# Patient Record
Sex: Female | Born: 1935 | Race: White | Hispanic: No | Smoking: Former smoker
Health system: Southern US, Community
[De-identification: ages and names within clinical notes are randomized; demographics above are authoritative.]

## PROBLEM LIST (undated history)

## (undated) DIAGNOSIS — T402X5A Adverse effect of other opioids, initial encounter: Secondary | ICD-10-CM

## (undated) DIAGNOSIS — D689 Coagulation defect, unspecified: Secondary | ICD-10-CM

## (undated) DIAGNOSIS — I5020 Unspecified systolic (congestive) heart failure: Secondary | ICD-10-CM

## (undated) DIAGNOSIS — M810 Age-related osteoporosis without current pathological fracture: Secondary | ICD-10-CM

## (undated) DIAGNOSIS — B0229 Other postherpetic nervous system involvement: Secondary | ICD-10-CM

## (undated) DIAGNOSIS — K5903 Drug induced constipation: Secondary | ICD-10-CM

## (undated) DIAGNOSIS — I252 Old myocardial infarction: Secondary | ICD-10-CM

## (undated) HISTORY — DX: Other postherpetic nervous system involvement: B02.29

## (undated) HISTORY — DX: Drug induced constipation: T40.2X5A

## (undated) HISTORY — DX: Adverse effect of other opioids, initial encounter: K59.03

## (undated) HISTORY — DX: Unspecified systolic (congestive) heart failure: I50.20

## (undated) HISTORY — DX: Coagulation defect, unspecified: D68.9

## (undated) HISTORY — DX: Age-related osteoporosis without current pathological fracture: M81.0

## (undated) HISTORY — DX: Old myocardial infarction: I25.2

## (undated) HISTORY — PX: HIP SURGERY: SHX245

---

## 2013-02-02 DIAGNOSIS — M949 Disorder of cartilage, unspecified: Secondary | ICD-10-CM | POA: Diagnosis not present

## 2013-02-02 DIAGNOSIS — M899 Disorder of bone, unspecified: Secondary | ICD-10-CM | POA: Diagnosis not present

## 2013-02-02 DIAGNOSIS — E559 Vitamin D deficiency, unspecified: Secondary | ICD-10-CM | POA: Diagnosis not present

## 2013-02-02 DIAGNOSIS — B0229 Other postherpetic nervous system involvement: Secondary | ICD-10-CM | POA: Diagnosis not present

## 2013-02-23 DIAGNOSIS — M949 Disorder of cartilage, unspecified: Secondary | ICD-10-CM | POA: Diagnosis not present

## 2013-02-23 DIAGNOSIS — M899 Disorder of bone, unspecified: Secondary | ICD-10-CM | POA: Diagnosis not present

## 2013-04-30 DIAGNOSIS — B0229 Other postherpetic nervous system involvement: Secondary | ICD-10-CM | POA: Diagnosis not present

## 2013-04-30 DIAGNOSIS — N183 Chronic kidney disease, stage 3 unspecified: Secondary | ICD-10-CM | POA: Diagnosis not present

## 2013-04-30 DIAGNOSIS — E559 Vitamin D deficiency, unspecified: Secondary | ICD-10-CM | POA: Diagnosis not present

## 2013-04-30 DIAGNOSIS — E785 Hyperlipidemia, unspecified: Secondary | ICD-10-CM | POA: Diagnosis not present

## 2013-04-30 DIAGNOSIS — R0602 Shortness of breath: Secondary | ICD-10-CM | POA: Diagnosis not present

## 2013-08-20 DIAGNOSIS — M899 Disorder of bone, unspecified: Secondary | ICD-10-CM | POA: Diagnosis not present

## 2013-08-20 DIAGNOSIS — M949 Disorder of cartilage, unspecified: Secondary | ICD-10-CM | POA: Diagnosis not present

## 2014-01-19 DIAGNOSIS — M8580 Other specified disorders of bone density and structure, unspecified site: Secondary | ICD-10-CM | POA: Diagnosis not present

## 2014-03-19 DIAGNOSIS — B0229 Other postherpetic nervous system involvement: Secondary | ICD-10-CM | POA: Diagnosis not present

## 2014-03-19 DIAGNOSIS — G894 Chronic pain syndrome: Secondary | ICD-10-CM | POA: Diagnosis not present

## 2014-03-19 DIAGNOSIS — R4189 Other symptoms and signs involving cognitive functions and awareness: Secondary | ICD-10-CM | POA: Diagnosis not present

## 2014-03-19 DIAGNOSIS — I482 Chronic atrial fibrillation: Secondary | ICD-10-CM | POA: Diagnosis not present

## 2014-03-20 DIAGNOSIS — I482 Chronic atrial fibrillation: Secondary | ICD-10-CM | POA: Diagnosis not present

## 2014-03-24 ENCOUNTER — Telehealth: Payer: Self-pay | Admitting: Family Medicine

## 2014-04-02 ENCOUNTER — Encounter (INDEPENDENT_AMBULATORY_CARE_PROVIDER_SITE_OTHER): Payer: Self-pay

## 2014-04-02 ENCOUNTER — Ambulatory Visit (INDEPENDENT_AMBULATORY_CARE_PROVIDER_SITE_OTHER): Payer: Medicare Other | Admitting: Family Medicine

## 2014-04-02 ENCOUNTER — Encounter: Payer: Self-pay | Admitting: Family Medicine

## 2014-04-02 VITALS — BP 146/74 | HR 101 | Temp 97.5°F | Ht 61.0 in | Wt 158.4 lb

## 2014-04-02 DIAGNOSIS — B0229 Other postherpetic nervous system involvement: Secondary | ICD-10-CM | POA: Diagnosis not present

## 2014-04-02 DIAGNOSIS — K5901 Slow transit constipation: Secondary | ICD-10-CM

## 2014-04-02 MED ORDER — METHADONE HCL 10 MG PO TABS: 10.0000 mg | ORAL_TABLET | Freq: Three times a day (TID) | ORAL | Status: DC

## 2014-04-02 NOTE — Progress Notes (Signed)
Subjective:  Patient ID: Sheena Morrow, female    DOB: [birth year]  Age: 81 y.o. MRN: [id number]  CC: Establish Care   HPI Sheena Morrow presents for follow-up of her postherpetic neuralgia. Her pain is quite severe. She brings in pain diaries showing that is under good control with the medication. Most of her pain has been in the 2-4/10 range as long as she is taking her methadone. Unfortunately she is been unable to find a provider closer to home willing to prescribe the methadone. Unfortunately the strength of the medication and her age have been of problems in developing trust with a new provider. Therefore she is following up here for now until she can find someone closer to home. Of note is that she has been on the same dose since the first visit with me several years ago. She has a documented postherpetic neuralgia of many years duration and has never given any evidence of drug inversion. She was subject to random drug testing during 2015 as part of an office initiative. She tested positive appropriately for the methadone and negative appropriately for other drugs. That will be included in her records as they are obtained from the caretaker of her record at [facility]. History Sheena Morrow has a past medical history of Postherpetic neuralgia; Osteoporosis; Constipation due to opioid therapy; and Clotting disorder.   She has no past surgical history on file.   Her family history is not on file.She reports that she quit smoking about 6 years ago. Her smoking use included Cigarettes. She started smoking about 45 years ago. She smoked 0.50 packs per day. She does not have any smokeless tobacco history on file. She reports that she does not drink alcohol or use illicit drugs.  No current outpatient prescriptions on file prior to visit.   No current facility-administered medications on file prior to visit.    ROS Review of Systems  Constitutional: Negative for fever, chills, diaphoresis, appetite  change, fatigue and unexpected weight change.  HENT: Negative for congestion, ear pain, hearing loss, postnasal drip, rhinorrhea, sneezing, sore throat and trouble swallowing.   Eyes: Negative for pain.  Respiratory: Negative for cough, chest tightness and shortness of breath.   Cardiovascular: Negative for chest pain and palpitations.  Gastrointestinal: Negative for nausea, vomiting, abdominal pain, diarrhea and constipation.  Genitourinary: Negative for dysuria, frequency and menstrual problem.  Musculoskeletal: Negative for joint swelling and arthralgias.  Skin: Negative for rash.  Neurological: Negative for dizziness, tremors, speech difficulty, weakness, numbness and headaches.  Psychiatric/Behavioral: Negative for dysphoric mood and agitation.    Objective:  BP 146/74 mmHg  Pulse 101  Temp(Src) 97.5 F (36.4 C) (Oral)  Ht 5\' 1"  (1.549 m)  Wt 158 lb 6.4 oz (71.85 kg)  BMI 29.94 kg/m2  BP Readings from Last 3 Encounters:  04/02/14 146/74    Wt Readings from Last 3 Encounters:  04/02/14 158 lb 6.4 oz (71.85 kg)     Physical Exam  Constitutional: She is oriented to person, place, and time. She appears well-developed and well-nourished. No distress.  HENT:  Head: Normocephalic and atraumatic.  Right Ear: External ear normal.  Left Ear: External ear normal.  Nose: Nose normal.  Mouth/Throat: Oropharynx is clear and moist.  Eyes: Conjunctivae and EOM are normal. Pupils are equal, round, and reactive to light.  Neck: Normal range of motion. Neck supple. No thyromegaly present.  Cardiovascular: Normal rate, regular rhythm and normal heart sounds.   No murmur heard. Pulmonary/Chest: Effort normal and breath  sounds normal. No respiratory distress. She has no wheezes. She has no rales.  Abdominal: Soft. Bowel sounds are normal. She exhibits no distension. There is no tenderness.  Lymphadenopathy:    She has no cervical adenopathy.  Neurological: She is alert and oriented to  person, place, and time. She has normal reflexes.  Skin: Skin is warm and dry.  Psychiatric: She has a normal mood and affect. Her behavior is normal. Judgment and thought content normal.          Assessment & Plan:   Sheena Morrow was seen today for establish care.  Diagnoses and all orders for this visit:  Postherpetic neuralgia  Slow transit constipation  Other orders -     Discontinue: methadone (DOLOPHINE) 10 MG tablet; Take 1 tablet (10 mg total) by mouth 3 (three) times daily. -     Discontinue: methadone (DOLOPHINE) 10 MG tablet; Take 1 tablet (10 mg total) by mouth 3 (three) times daily. -     Discontinue: methadone (DOLOPHINE) 10 MG tablet; Take 1 tablet (10 mg total) by mouth 3 (three) times daily. -     Discontinue: methadone (DOLOPHINE) 10 MG tablet; Take 1 tablet (10 mg total) by mouth 3 (three) times daily. -     Discontinue: methadone (DOLOPHINE) 10 MG tablet; Take 1 tablet (10 mg total) by mouth 3 (three) times daily. -     methadone (DOLOPHINE) 10 MG tablet; Take 1 tablet (10 mg total) by mouth 3 (three) times daily.  I am having Sheena Morrow maintain her atorvastatin, gabapentin, LINZESS, meloxicam, mometasone, montelukast, omeprazole, polyethylene glycol powder, XARELTO, PROLIA, and methadone.  Meds ordered this encounter  Medications  . atorvastatin (LIPITOR) 20 MG tablet    Sig: Take 20 mg by mouth daily.    Refill:  1  . gabapentin (NEURONTIN) 800 MG tablet    Sig: Take 1 tablet by mouth. Take 1 tablet each AM, take 1 tablet in the afternoon and take 2 tablets each evening    Refill:  4  . LINZESS 145 MCG CAPS capsule    Sig: Take 1 tablet by mouth 2 (two) times daily.    Refill:  4  . meloxicam (MOBIC) 15 MG tablet    Sig: Take 1 tablet by mouth daily.    Refill:  1  . DISCONTD: methadone (DOLOPHINE) 10 MG tablet    Sig: Take 1 tablet by mouth 3 (three) times daily.    Refill:  0  . mometasone (ELOCON) 0.1 % cream    Sig:     Refill:  5  . montelukast  (SINGULAIR) 10 MG tablet    Sig: Take 10 mg by mouth at bedtime.    Refill:  1  . omeprazole (PRILOSEC) 20 MG capsule    Sig: Take 20 mg by mouth daily.    Refill:  1  . polyethylene glycol powder (GLYCOLAX/MIRALAX) powder    Sig:     Refill:  3  . XARELTO 20 MG TABS tablet    Sig:   . PROLIA 60 MG/ML SOLN injection    Sig:   . DISCONTD: methadone (DOLOPHINE) 10 MG tablet    Sig: Take 1 tablet (10 mg total) by mouth 3 (three) times daily.    Dispense:  30 tablet    Refill:  0  . DISCONTD: methadone (DOLOPHINE) 10 MG tablet    Sig: Take 1 tablet (10 mg total) by mouth 3 (three) times daily.    Dispense:  30 tablet  Refill:  0  . DISCONTD: methadone (DOLOPHINE) 10 MG tablet    Sig: Take 1 tablet (10 mg total) by mouth 3 (three) times daily.    Dispense:  30 tablet    Refill:  0  . DISCONTD: methadone (DOLOPHINE) 10 MG tablet    Sig: Take 1 tablet (10 mg total) by mouth 3 (three) times daily.    Dispense:  90 tablet    Refill:  0  . DISCONTD: methadone (DOLOPHINE) 10 MG tablet    Sig: Take 1 tablet (10 mg total) by mouth 3 (three) times daily.    Dispense:  90 tablet    Refill:  0  . methadone (DOLOPHINE) 10 MG tablet    Sig: Take 1 tablet (10 mg total) by mouth 3 (three) times daily.    Dispense:  90 tablet    Refill:  0     Follow-up: Return in about 3 months (around 07/03/2014) for pain.  Claretta Fraise, M.D.

## 2014-04-05 DIAGNOSIS — G894 Chronic pain syndrome: Secondary | ICD-10-CM | POA: Diagnosis not present

## 2014-04-05 DIAGNOSIS — B0229 Other postherpetic nervous system involvement: Secondary | ICD-10-CM | POA: Diagnosis not present

## 2014-04-06 ENCOUNTER — Encounter: Payer: Self-pay | Admitting: Family Medicine

## 2014-04-06 DIAGNOSIS — B0229 Other postherpetic nervous system involvement: Secondary | ICD-10-CM | POA: Insufficient documentation

## 2014-05-19 ENCOUNTER — Telehealth: Payer: Self-pay | Admitting: Family Medicine

## 2014-06-15 ENCOUNTER — Other Ambulatory Visit: Payer: Self-pay | Admitting: Family Medicine

## 2014-06-30 ENCOUNTER — Telehealth: Payer: Self-pay | Admitting: Family Medicine

## 2014-06-30 ENCOUNTER — Other Ambulatory Visit: Payer: Self-pay | Admitting: Family Medicine

## 2014-07-01 NOTE — Telephone Encounter (Signed)
Patient aware to bring the prolia with her to the appointment.

## 2014-07-02 ENCOUNTER — Ambulatory Visit (INDEPENDENT_AMBULATORY_CARE_PROVIDER_SITE_OTHER): Payer: Medicare Other | Admitting: Family Medicine

## 2014-07-02 ENCOUNTER — Ambulatory Visit: Payer: Medicare Other | Admitting: Family Medicine

## 2014-07-02 ENCOUNTER — Encounter: Payer: Self-pay | Admitting: Family Medicine

## 2014-07-02 VITALS — BP 121/60 | HR 72 | Temp 97.1°F | Wt 162.4 lb

## 2014-07-02 DIAGNOSIS — B0229 Other postherpetic nervous system involvement: Secondary | ICD-10-CM

## 2014-07-02 DIAGNOSIS — K5901 Slow transit constipation: Secondary | ICD-10-CM | POA: Diagnosis not present

## 2014-07-02 MED ORDER — METHADONE HCL 10 MG PO TABS: 10.0000 mg | ORAL_TABLET | Freq: Three times a day (TID) | ORAL | Status: DC

## 2014-07-02 NOTE — Progress Notes (Signed)
Subjective:  Patient ID: Sheena Morrow, female    DOB: 02-11-1933  Age: 81 y.o. MRN: [id number]  CC: No chief complaint on file.   HPI Sheena Morrow presents for follow-up of chronic postherpetic neuralgia pain can be maximal 10/10 if she takes her dose of methadone more than a short time after the due to time. Generally minimal as long as she stays on her methadone. Pain is located in the back between shoulder blades primarily.  History Sheena Morrow has a past medical history of Postherpetic neuralgia; Osteoporosis; Constipation due to opioid therapy; and Clotting disorder.   She has no past surgical history on file.   Her family history is not on file.She reports that she quit smoking about 6 years ago. Her smoking use included Cigarettes. She started smoking about 45 years ago. She smoked 0.50 packs per day. She does not have any smokeless tobacco history on file. She reports that she does not drink alcohol or use illicit drugs.  Outpatient Prescriptions Prior to Visit  Medication Sig Dispense Refill  . atorvastatin (LIPITOR) 20 MG tablet Take 20 mg by mouth daily.  1  . gabapentin (NEURONTIN) 800 MG tablet TAKE 1 TABLET EACH MORNING,TAKE 1 TABLET EACH AFTERNOON & 2 TABLETS EACH EVENING 120 tablet 3  . LINZESS 145 MCG CAPS capsule Take 1 tablet by mouth 2 (two) times daily.  4  . meloxicam (MOBIC) 15 MG tablet TAKE 1 TABLET BY MOUTH EVERY DAY WITH FOOD FOR BACK PAIN 90 tablet 0  . mometasone (ELOCON) 0.1 % cream   5  . montelukast (SINGULAIR) 10 MG tablet Take 10 mg by mouth at bedtime.  1  . omeprazole (PRILOSEC) 20 MG capsule Take 20 mg by mouth daily.  1  . polyethylene glycol powder (GLYCOLAX/MIRALAX) powder USE 1 DOSING CUP ORALLY IN 8 OZ. OF WATER DAILY 255 g 3  . PROLIA 60 MG/ML SOLN injection     . XARELTO 20 MG TABS tablet     . methadone (DOLOPHINE) 10 MG tablet Take 1 tablet (10 mg total) by mouth 3 (three) times daily. 90 tablet 0   No facility-administered medications prior  to visit.    ROS Review of Systems  Constitutional: Negative for fever, chills, diaphoresis, appetite change, fatigue and unexpected weight change.  HENT: Negative for congestion, ear pain, hearing loss, postnasal drip, rhinorrhea, sneezing, sore throat and trouble swallowing.   Eyes: Negative for pain.  Respiratory: Negative for cough, chest tightness and shortness of breath.   Cardiovascular: Negative for chest pain and palpitations.  Gastrointestinal: Negative for nausea, vomiting, abdominal pain, diarrhea and constipation.  Genitourinary: Negative for dysuria, frequency and menstrual problem.  Musculoskeletal: Negative for joint swelling and arthralgias.  Skin: Negative for rash.  Neurological: Negative for dizziness, weakness, numbness and headaches.  Psychiatric/Behavioral: Negative for dysphoric mood and agitation.    Objective:  There were no vitals taken for this visit.  BP Readings from Last 3 Encounters:  04/02/14 146/74    Wt Readings from Last 3 Encounters:  04/02/14 158 lb 6.4 oz (71.85 kg)     Physical Exam  Constitutional: She is oriented to person, place, and time. She appears well-developed and well-nourished. No distress.  HENT:  Head: Normocephalic and atraumatic.  Right Ear: External ear normal.  Left Ear: External ear normal.  Nose: Nose normal.  Mouth/Throat: Oropharynx is clear and moist.  Eyes: Conjunctivae and EOM are normal. Pupils are equal, round, and reactive to light.  Neck: Normal range of motion.  Neck supple. No thyromegaly present.  Cardiovascular: Normal rate, regular rhythm and normal heart sounds.   No murmur heard. Pulmonary/Chest: Effort normal and breath sounds normal. No respiratory distress. She has no wheezes. She has no rales.  Abdominal: Soft. Bowel sounds are normal. She exhibits no distension. There is no tenderness.  Lymphadenopathy:    She has no cervical adenopathy.  Neurological: She is alert and oriented to person,  place, and time. She has normal reflexes.  Skin: Skin is warm and dry.  Psychiatric: She has a normal mood and affect. Her behavior is normal. Judgment and thought content normal.    No results found for: HGBA1C  No results found for: WBC, HGB, HCT, PLT, GLUCOSE, CHOL, TRIG, HDL, LDLDIRECT, LDLCALC, ALT, AST, NA, K, CL, CREATININE, BUN, CO2, TSH, PSA, INR, GLUF, HGBA1C, MICROALBUR  Patient was never admitted.  Assessment & Plan:   Diagnoses and all orders for this visit:  Postherpetic neuralgia  Slow transit constipation  Other orders -     Discontinue: methadone (DOLOPHINE) 10 MG tablet; Take 1 tablet (10 mg total) by mouth 3 (three) times daily. -     Discontinue: methadone (DOLOPHINE) 10 MG tablet; Take 1 tablet (10 mg total) by mouth 3 (three) times daily. -     methadone (DOLOPHINE) 10 MG tablet; Take 1 tablet (10 mg total) by mouth 3 (three) times daily.  I am having Sheena Morrow maintain her atorvastatin, LINZESS, mometasone, montelukast, omeprazole, XARELTO, PROLIA, gabapentin, polyethylene glycol powder, meloxicam, and methadone.  Meds ordered this encounter  Medications  . DISCONTD: methadone (DOLOPHINE) 10 MG tablet    Sig: Take 1 tablet (10 mg total) by mouth 3 (three) times daily.    Dispense:  90 tablet    Refill:  0  . DISCONTD: methadone (DOLOPHINE) 10 MG tablet    Sig: Take 1 tablet (10 mg total) by mouth 3 (three) times daily.    Dispense:  90 tablet    Refill:  0    Do Not fill until August 01, 2014  . methadone (DOLOPHINE) 10 MG tablet    Sig: Take 1 tablet (10 mg total) by mouth 3 (three) times daily.    Dispense:  90 tablet    Refill:  0    Do Not fill until August 31, 2014     Follow-up: Return in about 3 months (around 10/02/2014).  Claretta Fraise, M.D.

## 2014-07-06 ENCOUNTER — Telehealth: Payer: Self-pay

## 2014-07-06 NOTE — Telephone Encounter (Signed)
Patient said you were going to put a referral in for her for a foot dr.

## 2014-07-06 NOTE — Telephone Encounter (Signed)
Use Dr. Sharla Kidney or Donzetta Matters at [facility] in [place].

## 2014-07-07 ENCOUNTER — Other Ambulatory Visit: Payer: Self-pay

## 2014-07-07 ENCOUNTER — Telehealth: Payer: Self-pay | Admitting: Family Medicine

## 2014-07-07 DIAGNOSIS — M25579 Pain in unspecified ankle and joints of unspecified foot: Secondary | ICD-10-CM

## 2014-07-07 NOTE — Telephone Encounter (Signed)
Tell her it is already in the works

## 2014-07-07 NOTE — Telephone Encounter (Signed)
Please advise on referral

## 2014-07-08 ENCOUNTER — Telehealth: Payer: Self-pay | Admitting: Family Medicine

## 2014-07-13 ENCOUNTER — Other Ambulatory Visit: Payer: Self-pay | Admitting: *Deleted

## 2014-07-13 MED ORDER — OMEPRAZOLE 20 MG PO CPDR: 20.0000 mg | DELAYED_RELEASE_CAPSULE | Freq: Every day | ORAL | Status: DC

## 2014-07-23 ENCOUNTER — Ambulatory Visit (INDEPENDENT_AMBULATORY_CARE_PROVIDER_SITE_OTHER): Payer: Medicare Other

## 2014-07-23 DIAGNOSIS — M810 Age-related osteoporosis without current pathological fracture: Secondary | ICD-10-CM | POA: Diagnosis not present

## 2014-07-23 MED ORDER — DENOSUMAB 60 MG/ML ~~LOC~~ SOLN
60.0000 mg | Freq: Once | SUBCUTANEOUS | Status: AC
  Administered 2014-07-23: 60 mg via SUBCUTANEOUS

## 2014-07-29 DIAGNOSIS — L600 Ingrowing nail: Secondary | ICD-10-CM | POA: Diagnosis not present

## 2014-07-29 DIAGNOSIS — B351 Tinea unguium: Secondary | ICD-10-CM | POA: Diagnosis not present

## 2014-07-29 DIAGNOSIS — L602 Onychogryphosis: Secondary | ICD-10-CM | POA: Diagnosis not present

## 2014-10-05 ENCOUNTER — Ambulatory Visit (INDEPENDENT_AMBULATORY_CARE_PROVIDER_SITE_OTHER): Payer: Medicare Other | Admitting: Family Medicine

## 2014-10-05 ENCOUNTER — Encounter: Payer: Self-pay | Admitting: Family Medicine

## 2014-10-05 ENCOUNTER — Ambulatory Visit: Payer: Medicare Other | Admitting: Family Medicine

## 2014-10-05 VITALS — BP 121/55 | HR 73 | Temp 97.5°F | Ht 61.0 in | Wt 159.2 lb

## 2014-10-05 DIAGNOSIS — N3090 Cystitis, unspecified without hematuria: Secondary | ICD-10-CM

## 2014-10-05 DIAGNOSIS — R109 Unspecified abdominal pain: Secondary | ICD-10-CM

## 2014-10-05 DIAGNOSIS — B0229 Other postherpetic nervous system involvement: Secondary | ICD-10-CM | POA: Diagnosis not present

## 2014-10-05 LAB — POCT UA - MICROSCOPIC ONLY
CASTS, UR, LPF, POC: NEGATIVE
CRYSTALS, UR, HPF, POC: NEGATIVE
MUCUS UA: NEGATIVE
Yeast, UA: NEGATIVE

## 2014-10-05 LAB — POCT URINALYSIS DIPSTICK
BILIRUBIN UA: NEGATIVE
GLUCOSE UA: NEGATIVE
KETONES UA: NEGATIVE
NITRITE UA: NEGATIVE
Spec Grav, UA: 1.015
Urobilinogen, UA: NEGATIVE
pH, UA: 5

## 2014-10-05 MED ORDER — METHADONE HCL 10 MG PO TABS: 10.0000 mg | ORAL_TABLET | Freq: Three times a day (TID) | ORAL | Status: DC

## 2014-10-05 MED ORDER — CIPROFLOXACIN HCL 250 MG PO TABS: 250.0000 mg | ORAL_TABLET | Freq: Two times a day (BID) | ORAL | Status: DC

## 2014-10-05 NOTE — Progress Notes (Signed)
Subjective:  Patient ID: Sheena Morrow, female    DOB: [birth year]  Age: 81 y.o. MRN: [id number]  CC: Follow-up   HPI Sheena Morrow presents for Pain recheck. Now having bilateral flank pain In the afternoon once or twice a week. 5/10. Relief with cranberry juice and Azo.Onset a month ago. No dysuria.  Patient brings in pain diaries. They're reviewed showing that she is checking one representative week out of each month. In June her pain will arrange for one due for mostly in the 2 style. In July she had one day where it went to a 5 but mostly rested time it was 2 or 3. In August she had pain again in the 1-4 range most of the time. However on one day she had a pain noted as high as 7. She says that's because she went on a trip and forgot to take her pain pills so she was without her medicine and her pain flared because of that pain is between her shoulder blades. It is secondary to her previous bout of shingles and chronic postherpetic neuralgia pain. Sometimes it can hurt so bad it goes through to her chest and makes her chest hurt and burn very bad. However her methadone keeps this from occurring.  History Sheena Morrow has a past medical history of Postherpetic neuralgia; Osteoporosis; Constipation due to opioid therapy; and Clotting disorder.   She has no past surgical history on file.   Her family history is not on file.She reports that she quit smoking about 6 years ago. Her smoking use included Cigarettes. She started smoking about 45 years ago. She smoked 0.50 packs per day. She does not have any smokeless tobacco history on file. She reports that she does not drink alcohol or use illicit drugs.  Outpatient Prescriptions Prior to Visit  Medication Sig Dispense Refill  . atorvastatin (LIPITOR) 20 MG tablet Take 20 mg by mouth daily.  1  . gabapentin (NEURONTIN) 800 MG tablet TAKE 1 TABLET EACH MORNING,TAKE 1 TABLET EACH AFTERNOON & 2 TABLETS EACH EVENING 120 tablet 3  . LINZESS 145 MCG CAPS  capsule Take 1 tablet by mouth 2 (two) times daily.  4  . meloxicam (MOBIC) 15 MG tablet TAKE 1 TABLET BY MOUTH EVERY DAY WITH FOOD FOR BACK PAIN 90 tablet 0  . mometasone (ELOCON) 0.1 % cream   5  . montelukast (SINGULAIR) 10 MG tablet Take 10 mg by mouth at bedtime.  1  . omeprazole (PRILOSEC) 20 MG capsule Take 1 capsule (20 mg total) by mouth daily. 30 capsule 5  . polyethylene glycol powder (GLYCOLAX/MIRALAX) powder USE 1 DOSING CUP ORALLY IN 8 OZ. OF WATER DAILY 255 g 3  . PROLIA 60 MG/ML SOLN injection     . XARELTO 20 MG TABS tablet     . methadone (DOLOPHINE) 10 MG tablet Take 1 tablet (10 mg total) by mouth 3 (three) times daily. 90 tablet 0   No facility-administered medications prior to visit.    ROS Review of Systems  Constitutional: Negative for fever, chills, diaphoresis, appetite change, fatigue and unexpected weight change.  HENT: Negative for congestion, ear pain, hearing loss, postnasal drip, rhinorrhea, sneezing, sore throat and trouble swallowing.   Eyes: Negative for pain.  Respiratory: Negative for cough, chest tightness and shortness of breath.   Cardiovascular: Negative for chest pain and palpitations.  Gastrointestinal: Negative for nausea, vomiting, abdominal pain, diarrhea and constipation.  Genitourinary: Negative for dysuria, frequency and menstrual problem.  Musculoskeletal: Negative for  joint swelling and arthralgias.  Skin: Negative for rash.  Neurological: Negative for dizziness, weakness, numbness and headaches.  Psychiatric/Behavioral: Negative for dysphoric mood and agitation.    Objective:  BP 121/55 mmHg  Pulse 73  Temp(Src) 97.5 F (36.4 C) (Oral)  Ht 5\' 1"  (1.549 m)  Wt 159 lb 3.2 oz (72.213 kg)  BMI 30.10 kg/m2  BP Readings from Last 3 Encounters:  10/05/14 121/55  07/02/14 121/60  04/02/14 146/74    Wt Readings from Last 3 Encounters:  10/05/14 159 lb 3.2 oz (72.213 kg)  07/02/14 162 lb 6.4 oz (73.664 kg)  04/02/14 158 lb 6.4 oz  (71.85 kg)     Physical Exam  Constitutional: She is oriented to person, place, and time. She appears well-developed and well-nourished. No distress.  HENT:  Head: Normocephalic and atraumatic.  Right Ear: External ear normal.  Left Ear: External ear normal.  Nose: Nose normal.  Mouth/Throat: Oropharynx is clear and moist.  Eyes: Conjunctivae and EOM are normal. Pupils are equal, round, and reactive to light.  Neck: Normal range of motion. Neck supple. No thyromegaly present.  Cardiovascular: Normal rate, regular rhythm and normal heart sounds.   No murmur heard. Pulmonary/Chest: Effort normal and breath sounds normal. No respiratory distress. She has no wheezes. She has no rales.  Abdominal: Soft. Bowel sounds are normal. She exhibits no distension. There is no tenderness.  Lymphadenopathy:    She has no cervical adenopathy.  Neurological: She is alert and oriented to person, place, and time. She has normal reflexes.  Skin: Skin is warm and dry.  Psychiatric: She has a normal mood and affect. Her behavior is normal. Judgment and thought content normal.    No results found for: HGBA1C  No results found for: WBC, HGB, HCT, PLT, GLUCOSE, CHOL, TRIG, HDL, LDLDIRECT, LDLCALC, ALT, AST, NA, K, CL, CREATININE, BUN, CO2, TSH, PSA, INR, GLUF, HGBA1C, MICROALBUR  Patient was never admitted.  Assessment & Plan:   Sheena Morrow was seen today for follow-up.  Diagnoses and all orders for this visit:  Flank pain -     POCT urinalysis dipstick -     POCT UA - Microscopic Only  Postherpetic neuralgia  Other orders -     methadone (DOLOPHINE) 10 MG tablet; Take 1 tablet (10 mg total) by mouth 3 (three) times daily. -     methadone (DOLOPHINE) 10 MG tablet; Take 1 tablet (10 mg total) by mouth 3 (three) times daily. -     methadone (DOLOPHINE) 10 MG tablet; Take 1 tablet (10 mg total) by mouth 3 (three) times daily.   I am having Sheena Morrow start on methadone and methadone. I am also having  her maintain her atorvastatin, LINZESS, mometasone, montelukast, XARELTO, PROLIA, gabapentin, polyethylene glycol powder, meloxicam, omeprazole, and methadone.  Meds ordered this encounter  Medications  . methadone (DOLOPHINE) 10 MG tablet    Sig: Take 1 tablet (10 mg total) by mouth 3 (three) times daily.    Dispense:  90 tablet    Refill:  0    Do Not fill until November 04, 2014  . methadone (DOLOPHINE) 10 MG tablet    Sig: Take 1 tablet (10 mg total) by mouth 3 (three) times daily.    Dispense:  90 tablet    Refill:  0    Do not fill until Nov.5, 2016  . methadone (DOLOPHINE) 10 MG tablet    Sig: Take 1 tablet (10 mg total) by mouth 3 (three) times daily.  Dispense:  90 tablet    Refill:  0     Follow-up: Return in about 3 months (around 01/04/2015).  Claretta Fraise, M.D.

## 2014-10-11 ENCOUNTER — Other Ambulatory Visit: Payer: Self-pay | Admitting: Family Medicine

## 2014-12-17 ENCOUNTER — Telehealth: Payer: Self-pay | Admitting: Pharmacist

## 2014-12-17 ENCOUNTER — Other Ambulatory Visit: Payer: Self-pay | Admitting: Pharmacist

## 2014-12-17 MED ORDER — PROLIA 60 MG/ML ~~LOC~~ SOLN: SUBCUTANEOUS | Status: DC

## 2014-12-17 NOTE — Telephone Encounter (Signed)
Patient is due to have Prolia injection 01/22/2015 or after.  Tried to call to make appt - but unable to reach patient.  Need to order Prolia form mail order pharmacy - Rx sent.  Will try to administer 01/25/2015 is patient is able and also do an AWV for Medicare.

## 2014-12-18 ENCOUNTER — Other Ambulatory Visit: Payer: Self-pay | Admitting: Family Medicine

## 2014-12-27 DIAGNOSIS — D2330 Other benign neoplasm of skin of unspecified part of face: Secondary | ICD-10-CM | POA: Diagnosis not present

## 2014-12-27 DIAGNOSIS — D2339 Other benign neoplasm of skin of other parts of face: Secondary | ICD-10-CM | POA: Diagnosis not present

## 2015-01-04 ENCOUNTER — Ambulatory Visit (INDEPENDENT_AMBULATORY_CARE_PROVIDER_SITE_OTHER): Payer: Medicare Other | Admitting: Family Medicine

## 2015-01-04 ENCOUNTER — Encounter: Payer: Self-pay | Admitting: Family Medicine

## 2015-01-04 VITALS — BP 135/66 | HR 78 | Temp 97.1°F | Ht 61.0 in | Wt 161.4 lb

## 2015-01-04 DIAGNOSIS — Z23 Encounter for immunization: Secondary | ICD-10-CM | POA: Diagnosis not present

## 2015-01-04 DIAGNOSIS — L719 Rosacea, unspecified: Secondary | ICD-10-CM | POA: Diagnosis not present

## 2015-01-04 DIAGNOSIS — R1011 Right upper quadrant pain: Secondary | ICD-10-CM | POA: Diagnosis not present

## 2015-01-04 DIAGNOSIS — R109 Unspecified abdominal pain: Secondary | ICD-10-CM

## 2015-01-04 DIAGNOSIS — B0229 Other postherpetic nervous system involvement: Secondary | ICD-10-CM

## 2015-01-04 LAB — POCT UA - MICROSCOPIC ONLY
Casts, Ur, LPF, POC: NEGATIVE
Crystals, Ur, HPF, POC: NEGATIVE
Mucus, UA: NEGATIVE
Yeast, UA: NEGATIVE

## 2015-01-04 LAB — POCT URINALYSIS DIPSTICK
Bilirubin, UA: NEGATIVE
GLUCOSE UA: NEGATIVE
Ketones, UA: NEGATIVE
NITRITE UA: NEGATIVE
PH UA: 5
Protein, UA: NEGATIVE
Spec Grav, UA: 1.02
UROBILINOGEN UA: NEGATIVE

## 2015-01-04 MED ORDER — METHADONE HCL 10 MG PO TABS: 10.0000 mg | ORAL_TABLET | Freq: Three times a day (TID) | ORAL | Status: DC

## 2015-01-04 MED ORDER — NITROFURANTOIN MONOHYD MACRO 100 MG PO CAPS: 100.0000 mg | ORAL_CAPSULE | Freq: Two times a day (BID) | ORAL | Status: DC

## 2015-01-04 MED ORDER — METRONIDAZOLE 1 % EX GEL: Freq: Every day | CUTANEOUS | Status: DC

## 2015-01-04 NOTE — Progress Notes (Signed)
Subjective:  Patient ID: Sheena Morrow, female    DOB: Aug 23, 1933  Age: 81 y.o. MRN: [id number]  CC: postherpetic neuralagia   HPI Sheena Morrow presents for pain remains 1-2/10 as long she takes her methadone. However pain between her shoulder blades will quickly go to 10/10 if she misses a dose. She denies any side effects with constipation. No dizziness or drowsiness. Her dose of methadone has remained the same for approximately 10 years. Currently she is very concerned about right lower quadrant pain that can become severe and radiates toward the right CVA region. It feels like a bladder infection and she is doing cranberry juice treatments with some relief. Additionally she is concerned about facial rash. The Elocon helped some but it doesn't seem to be going away completely. She says until 1 year ago she had the perfect complexion now she is having breakouts on her face periodically.  History Sheena Morrow has a past medical history of Postherpetic neuralgia; Osteoporosis; Constipation due to opioid therapy; and Clotting disorder ([place]).   She has no past surgical history on file.   Her family history is not on file.She reports that she quit smoking about 6 years ago. Her smoking use included Cigarettes. She started smoking about 45 years ago. She smoked 0.50 packs per day. She does not have any smokeless tobacco history on file. She reports that she does not drink alcohol or use illicit drugs.  Outpatient Prescriptions Prior to Visit  Medication Sig Dispense Refill  . atorvastatin (LIPITOR) 20 MG tablet Take 20 mg by mouth daily.  1  . gabapentin (NEURONTIN) 800 MG tablet TAKE 1 TABLET EACH MORNING,TAKE 1 TABLET EACH AFTERNOON & 2 TABLETS EACH EVENING 120 tablet 1  . LINZESS 145 MCG CAPS capsule Take 1 tablet by mouth 2 (two) times daily.  4  . meloxicam (MOBIC) 15 MG tablet TAKE 1 TABLET BY MOUTH EVERY DAY WITH FOOD FOR BACK PAIN 90 tablet 0  . mometasone (ELOCON) 0.1 % cream   5  .  montelukast (SINGULAIR) 10 MG tablet Take 10 mg by mouth at bedtime.  1  . omeprazole (PRILOSEC) 20 MG capsule Take 1 capsule (20 mg total) by mouth daily. 30 capsule 5  . polyethylene glycol powder (GLYCOLAX/MIRALAX) powder USE 1 DOSING CUP ORALLY IN 8 OZ. OF WATER DAILY 255 g 3  . PROLIA 60 MG/ML SOLN injection Bring of office for adminstration 1 mL 0  . XARELTO 20 MG TABS tablet     . methadone (DOLOPHINE) 10 MG tablet Take 1 tablet (10 mg total) by mouth 3 (three) times daily. 90 tablet 0  . methadone (DOLOPHINE) 10 MG tablet Take 1 tablet (10 mg total) by mouth 3 (three) times daily. 90 tablet 0  . methadone (DOLOPHINE) 10 MG tablet Take 1 tablet (10 mg total) by mouth 3 (three) times daily. 90 tablet 0  . ciprofloxacin (CIPRO) 250 MG tablet Take 1 tablet (250 mg total) by mouth 2 (two) times daily. 6 tablet 0   No facility-administered medications prior to visit.    ROS Review of Systems  Constitutional: Negative for fever, activity change and appetite change.  HENT: Negative for congestion, rhinorrhea and sore throat.   Eyes: Negative for visual disturbance.  Respiratory: Negative for cough and shortness of breath.   Cardiovascular: Negative for chest pain and palpitations.  Gastrointestinal: Negative for nausea, abdominal pain and diarrhea.  Genitourinary: Negative for dysuria.  Musculoskeletal: Positive for back pain (based on post herpetic neuralgia - thoracicc).  Negative for myalgias and arthralgias.  Skin: Positive for rash (n face see history of present illness).    Objective:  BP 135/66 mmHg  Pulse 78  Temp(Src) 97.1 F (36.2 C) (Oral)  Ht 5\' 1"  (1.549 m)  Wt 161 lb 6.4 oz (73.211 kg)  BMI 30.51 kg/m2  SpO2 98%  BP Readings from Last 3 Encounters:  01/04/15 135/66  10/05/14 121/55  07/02/14 121/60    Wt Readings from Last 3 Encounters:  01/04/15 161 lb 6.4 oz (73.211 kg)  10/05/14 159 lb 3.2 oz (72.213 kg)  07/02/14 162 lb 6.4 oz (73.664 kg)     Physical  Exam  Constitutional: She is oriented to person, place, and time. She appears well-developed and well-nourished. No distress.  HENT:  Head: Normocephalic and atraumatic.  Right Ear: External ear normal.  Left Ear: External ear normal.  Nose: Nose normal.  Mouth/Throat: Oropharynx is clear and moist.  Eyes: Conjunctivae and EOM are normal. Pupils are equal, round, and reactive to light.  Neck: Normal range of motion. Neck supple. No thyromegaly present.  Cardiovascular: Normal rate, regular rhythm and normal heart sounds.   No murmur heard. Pulmonary/Chest: Effort normal and breath sounds normal. No respiratory distress. She has no wheezes. She has no rales.  Abdominal: Soft. Bowel sounds are normal. She exhibits no distension. There is no tenderness.  Lymphadenopathy:    She has no cervical adenopathy.  Neurological: She is alert and oriented to person, place, and time. She has normal reflexes.  Skin: Skin is warm and dry. Rash (Mild papular eruption on face) noted.  Psychiatric: She has a normal mood and affect. Her behavior is normal. Judgment and thought content normal.     No results found for: WBC, HGB, HCT, PLT, GLUCOSE, CHOL, TRIG, HDL, LDLDIRECT, LDLCALC, ALT, AST, NA, K, CL, CREATININE, BUN, CO2, TSH, PSA, INR, GLUF, HGBA1C, MICROALBUR  Patient was never admitted.  Assessment & Plan:   Sheena Morrow was seen today for postherpetic neuralagia.  Diagnoses and all orders for this visit:  Postherpetic neuralgia  Encounter for immunization  Rosacea  Acute right flank pain -     POCT urinalysis dipstick -     POCT UA - Microscopic Only -     Urine culture -     Ambulatory referral to Urology  Other orders -     Flu Vaccine QUAD 36+ mos IM -     Discontinue: methadone (DOLOPHINE) 10 MG tablet; Take 1 tablet (10 mg total) by mouth 3 (three) times daily. -     Discontinue: methadone (DOLOPHINE) 10 MG tablet; Take 1 tablet (10 mg total) by mouth 3 (three) times daily. -      Discontinue: methadone (DOLOPHINE) 10 MG tablet; Take 1 tablet (10 mg total) by mouth 3 (three) times daily. -     metroNIDAZOLE (METROGEL) 1 % gel; Apply topically daily. for facial rash. -     methadone (DOLOPHINE) 10 MG tablet; Take 1 tablet (10 mg total) by mouth 3 (three) times daily. -     methadone (DOLOPHINE) 10 MG tablet; Take 1 tablet (10 mg total) by mouth 3 (three) times daily. -     methadone (DOLOPHINE) 10 MG tablet; Take 1 tablet (10 mg total) by mouth 3 (three) times daily. -     nitrofurantoin, macrocrystal-monohydrate, (MACROBID) 100 MG capsule; Take 1 capsule (100 mg total) by mouth 2 (two) times daily.   I have discontinued Sheena Morrow's ciprofloxacin. I am also having her start  on metroNIDAZOLE and nitrofurantoin (macrocrystal-monohydrate). Additionally, I am having her maintain her atorvastatin, LINZESS, mometasone, montelukast, XARELTO, polyethylene glycol powder, omeprazole, meloxicam, PROLIA, gabapentin, methadone, methadone, and methadone.  Meds ordered this encounter  Medications  . DISCONTD: methadone (DOLOPHINE) 10 MG tablet    Sig: Take 1 tablet (10 mg total) by mouth 3 (three) times daily.    Dispense:  90 tablet    Refill:  0    Do Not fill until Mar 04, 2015  . DISCONTD: methadone (DOLOPHINE) 10 MG tablet    Sig: Take 1 tablet (10 mg total) by mouth 3 (three) times daily.    Dispense:  90 tablet    Refill:  0    Do not fill until Jan. 4, 2017  . DISCONTD: methadone (DOLOPHINE) 10 MG tablet    Sig: Take 1 tablet (10 mg total) by mouth 3 (three) times daily.    Dispense:  90 tablet    Refill:  0  . metroNIDAZOLE (METROGEL) 1 % gel    Sig: Apply topically daily. for facial rash.    Dispense:  45 g    Refill:  10  . methadone (DOLOPHINE) 10 MG tablet    Sig: Take 1 tablet (10 mg total) by mouth 3 (three) times daily.    Dispense:  90 tablet    Refill:  0    Do Not fill until Mar 04, 2015  . methadone (DOLOPHINE) 10 MG tablet    Sig: Take 1 tablet (10 mg  total) by mouth 3 (three) times daily.    Dispense:  90 tablet    Refill:  0    Do not fill until Jan. 4, 2017  . methadone (DOLOPHINE) 10 MG tablet    Sig: Take 1 tablet (10 mg total) by mouth 3 (three) times daily.    Dispense:  90 tablet    Refill:  0  . nitrofurantoin, macrocrystal-monohydrate, (MACROBID) 100 MG capsule    Sig: Take 1 capsule (100 mg total) by mouth 2 (two) times daily.    Dispense:  14 capsule    Refill:  0     Follow-up: Return in about 3 months (around 04/04/2015) for Pain.  Claretta Fraise, M.D.

## 2015-01-05 NOTE — Progress Notes (Signed)
Patient aware. Will pick up rx °

## 2015-01-06 LAB — URINE CULTURE

## 2015-01-20 DIAGNOSIS — N200 Calculus of kidney: Secondary | ICD-10-CM | POA: Diagnosis not present

## 2015-01-20 DIAGNOSIS — R109 Unspecified abdominal pain: Secondary | ICD-10-CM | POA: Diagnosis not present

## 2015-01-21 ENCOUNTER — Telehealth: Payer: Self-pay | Admitting: Family Medicine

## 2015-01-21 ENCOUNTER — Other Ambulatory Visit: Payer: Self-pay | Admitting: Family Medicine

## 2015-01-21 DIAGNOSIS — M810 Age-related osteoporosis without current pathological fracture: Secondary | ICD-10-CM

## 2015-01-21 NOTE — Telephone Encounter (Signed)
Patient would like to postpone Prolia injection until she comes in March 2017.  She is due next injection 01/25/2015. When I explained that it is recommended to give this medication every 6 months and that I would be concern that she would not get as much benefit if injection time was prolonged she asked if she could switch to oral medication for treatment instead.  I did not see any contraindications to an oral bisphosphonate and patient states that she has taken before.  I would recommend recheck of BMET and DEXA which I has planned for 01/25/15 but patient states she cannot come that day.  I will forward to her PCP at patient's request for recommendations for treatment of osteoporosis.

## 2015-01-25 ENCOUNTER — Ambulatory Visit: Payer: Self-pay | Admitting: Pharmacist

## 2015-01-25 ENCOUNTER — Telehealth: Payer: Self-pay | Admitting: Pharmacist

## 2015-01-25 MED ORDER — ALENDRONATE SODIUM 70 MG PO TABS: 70.0000 mg | ORAL_TABLET | ORAL | Status: DC

## 2015-01-25 NOTE — Telephone Encounter (Signed)
Please see telephone note from 01/25/2015.  Fosamax called in and patient notified.

## 2015-01-25 NOTE — Telephone Encounter (Signed)
If she wants to go back to the pill, I recommend fosamax 70 mg weekly.Please send in a 3 month supply with refills for a year if this is pt. Preference. Another option is to take this weekly until her next appt. At which time she could convert to prolia. Thanks, WS

## 2015-01-25 NOTE — Telephone Encounter (Signed)
Patient was calling to ask about change from Prolia to oral bisphosphonate.  Per Dr Livia Snellen note this was ok.  He ordered generic fosamax 70mg  1 tablet weekly #12 with RF for 1 year. Rx sent to pharmacy for patient as this was per preference.  Cancelled Rx for Prolia.

## 2015-02-21 DIAGNOSIS — N200 Calculus of kidney: Secondary | ICD-10-CM | POA: Diagnosis not present

## 2015-02-21 DIAGNOSIS — R109 Unspecified abdominal pain: Secondary | ICD-10-CM | POA: Diagnosis not present

## 2015-02-21 DIAGNOSIS — N281 Cyst of kidney, acquired: Secondary | ICD-10-CM | POA: Diagnosis not present

## 2015-03-04 DIAGNOSIS — N200 Calculus of kidney: Secondary | ICD-10-CM | POA: Diagnosis not present

## 2015-03-04 DIAGNOSIS — N281 Cyst of kidney, acquired: Secondary | ICD-10-CM | POA: Diagnosis not present

## 2015-03-22 DIAGNOSIS — R109 Unspecified abdominal pain: Secondary | ICD-10-CM | POA: Diagnosis not present

## 2015-04-11 ENCOUNTER — Encounter: Payer: Self-pay | Admitting: Family Medicine

## 2015-04-11 ENCOUNTER — Ambulatory Visit (INDEPENDENT_AMBULATORY_CARE_PROVIDER_SITE_OTHER): Payer: Medicare Other | Admitting: Family Medicine

## 2015-04-11 ENCOUNTER — Ambulatory Visit (INDEPENDENT_AMBULATORY_CARE_PROVIDER_SITE_OTHER): Payer: Medicare Other

## 2015-04-11 VITALS — BP 125/79 | HR 92 | Temp 97.4°F | Ht 61.0 in | Wt 160.0 lb

## 2015-04-11 DIAGNOSIS — M810 Age-related osteoporosis without current pathological fracture: Secondary | ICD-10-CM

## 2015-04-11 DIAGNOSIS — B0229 Other postherpetic nervous system involvement: Secondary | ICD-10-CM | POA: Diagnosis not present

## 2015-04-11 DIAGNOSIS — N281 Cyst of kidney, acquired: Secondary | ICD-10-CM

## 2015-04-11 DIAGNOSIS — Q6100 Congenital renal cyst, unspecified: Secondary | ICD-10-CM

## 2015-04-11 MED ORDER — MOMETASONE FUROATE 0.1 % EX CREA: TOPICAL_CREAM | CUTANEOUS | Status: DC

## 2015-04-11 MED ORDER — METHADONE HCL 10 MG PO TABS: 10.0000 mg | ORAL_TABLET | Freq: Three times a day (TID) | ORAL | Status: DC

## 2015-04-11 NOTE — Progress Notes (Signed)
Subjective:  Patient ID: Sheena Morrow, female    DOB: 12-11-1933  Age: 82 y.o. MRN: [id number]  CC: 3 month follow up   HPI Sheena Morrow presents for  Concern about impact of renal cyst. Also broken out on face. Poiints out 3 distinct lesions. Not using metrogel. This is a different outbreak.   Pain assessment: Cause of pain- postherpetic neuralgia Pain location- mid back Pain on scale of 1-10- 2-3 Frequency- constant What increases pain-missing med. (rare) What makes pain Better-meds only Effects on ADL - none as long as she takes the methadone Any change in general medical condition-none  Current medications-  Methadone, gaaabapentin Effectiveness of current meds- adequate Adverse reactions form pain meds- denied  Pill count performed-No Urine drug screen- No    History Sheena Morrow has a past medical history of Postherpetic neuralgia; Osteoporosis; Constipation due to opioid therapy; and Clotting disorder ([place]).   She has no past surgical history on file.   Her family history is not on file.She reports that she quit smoking about 7 years ago. Her smoking use included Cigarettes. She started smoking about 46 years ago. She smoked 0.50 packs per day. She does not have any smokeless tobacco history on file. She reports that she does not drink alcohol or use illicit drugs.    ROS Review of Systems  Constitutional: Negative for fever, activity change and appetite change.  HENT: Negative for congestion, rhinorrhea and sore throat.   Eyes: Negative for visual disturbance.  Respiratory: Negative for cough and shortness of breath.   Cardiovascular: Negative for chest pain and palpitations.  Gastrointestinal: Negative for nausea, abdominal pain and diarrhea.  Genitourinary: Negative for dysuria.  Musculoskeletal: Negative for myalgias and arthralgias.  Neurological: Negative for dizziness, tremors, speech difficulty, weakness and numbness.    Objective:  BP 125/79 mmHg   Pulse 92  Temp(Src) 97.4 F (36.3 C) (Oral)  Ht 5\' 1"  (1.549 m)  Wt 160 lb (72.576 kg)  BMI 30.25 kg/m2  BP Readings from Last 3 Encounters:  04/11/15 125/79  01/04/15 135/66  10/05/14 121/55    Wt Readings from Last 3 Encounters:  04/11/15 160 lb (72.576 kg)  01/04/15 161 lb 6.4 oz (73.211 kg)  10/05/14 159 lb 3.2 oz (72.213 kg)     Physical Exam  Constitutional: She is oriented to person, place, and time. She appears well-developed and well-nourished. No distress.  HENT:  Head: Normocephalic and atraumatic.  Right Ear: External ear normal.  Left Ear: External ear normal.  Nose: Nose normal.  Mouth/Throat: Oropharynx is clear and moist.  Eyes: Conjunctivae and EOM are normal. Pupils are equal, round, and reactive to light.  Neck: Normal range of motion. Neck supple. No thyromegaly present.  Cardiovascular: Normal rate, regular rhythm and normal heart sounds.   No murmur heard. Pulmonary/Chest: Effort normal and breath sounds normal. No respiratory distress. She has no wheezes. She has no rales.  Abdominal: Soft. Bowel sounds are normal. She exhibits no distension. There is no tenderness.  Lymphadenopathy:    She has no cervical adenopathy.  Neurological: She is alert and oriented to person, place, and time. She has normal reflexes.  Skin: Skin is warm and dry.  Psychiatric: She has a normal mood and affect. Her behavior is normal. Judgment and thought content normal.     No results found for: WBC, HGB, HCT, PLT, GLUCOSE, CHOL, TRIG, HDL, LDLDIRECT, LDLCALC, ALT, AST, NA, K, CL, CREATININE, BUN, CO2, TSH, PSA, INR, GLUF, HGBA1C, MICROALBUR  Patient was  never admitted.  Assessment & Plan:   Sheena Morrow was seen today for 3 month follow up.  Diagnoses and all orders for this visit:  Postherpetic neuralgia  Osteoporosis -     DG Bone Density; Future  Renal cyst  Other orders -     methadone (DOLOPHINE) 10 MG tablet; Take 1 tablet (10 mg total) by mouth 3 (three)  times daily. -     methadone (DOLOPHINE) 10 MG tablet; Take 1 tablet (10 mg total) by mouth 3 (three) times daily. -     methadone (DOLOPHINE) 10 MG tablet; Take 1 tablet (10 mg total) by mouth 3 (three) times daily. -     mometasone (ELOCON) 0.1 % cream; To affected areas      I have discontinued Sheena Morrow's XARELTO and nitrofurantoin (macrocrystal-monohydrate). I have also changed her mometasone. Additionally, I am having her maintain her LINZESS, montelukast, polyethylene glycol powder, gabapentin, metroNIDAZOLE, meloxicam, atorvastatin, omeprazole, alendronate, methadone, methadone, and methadone.  Meds ordered this encounter  Medications  . methadone (DOLOPHINE) 10 MG tablet    Sig: Take 1 tablet (10 mg total) by mouth 3 (three) times daily.    Dispense:  90 tablet    Refill:  0    Do Not fill until Jun 10, 2015  . methadone (DOLOPHINE) 10 MG tablet    Sig: Take 1 tablet (10 mg total) by mouth 3 (three) times daily.    Dispense:  90 tablet    Refill:  0    Do not fill until May 11, 2015  . methadone (DOLOPHINE) 10 MG tablet    Sig: Take 1 tablet (10 mg total) by mouth 3 (three) times daily.    Dispense:  90 tablet    Refill:  0  . mometasone (ELOCON) 0.1 % cream    Sig: To affected areas    Dispense:  45 g    Refill:  5     Follow-up: Return in about 3 months (around 07/12/2015) for Pain.  Claretta Fraise, M.D.

## 2015-04-18 ENCOUNTER — Other Ambulatory Visit: Payer: Self-pay | Admitting: Family Medicine

## 2015-04-18 NOTE — Telephone Encounter (Signed)
Last seen 04/11/15  Dr Livia Snellen  No lipids in [facility]

## 2015-04-21 ENCOUNTER — Other Ambulatory Visit: Payer: Self-pay | Admitting: Family Medicine

## 2015-04-29 ENCOUNTER — Other Ambulatory Visit: Payer: Self-pay | Admitting: *Deleted

## 2015-04-29 MED ORDER — GABAPENTIN 800 MG PO TABS: ORAL_TABLET | ORAL | Status: DC

## 2015-05-13 DIAGNOSIS — I959 Hypotension, unspecified: Secondary | ICD-10-CM | POA: Diagnosis not present

## 2015-05-13 DIAGNOSIS — I2119 ST elevation (STEMI) myocardial infarction involving other coronary artery of inferior wall: Secondary | ICD-10-CM | POA: Diagnosis not present

## 2015-05-13 DIAGNOSIS — R404 Transient alteration of awareness: Secondary | ICD-10-CM | POA: Diagnosis not present

## 2015-05-13 DIAGNOSIS — I213 ST elevation (STEMI) myocardial infarction of unspecified site: Secondary | ICD-10-CM | POA: Diagnosis not present

## 2015-05-13 DIAGNOSIS — Z87891 Personal history of nicotine dependence: Secondary | ICD-10-CM | POA: Diagnosis not present

## 2015-05-13 DIAGNOSIS — M7989 Other specified soft tissue disorders: Secondary | ICD-10-CM | POA: Diagnosis not present

## 2015-05-13 DIAGNOSIS — R001 Bradycardia, unspecified: Secondary | ICD-10-CM | POA: Diagnosis not present

## 2015-05-13 DIAGNOSIS — R9431 Abnormal electrocardiogram [ECG] [EKG]: Secondary | ICD-10-CM | POA: Diagnosis not present

## 2015-05-13 DIAGNOSIS — R55 Syncope and collapse: Secondary | ICD-10-CM | POA: Diagnosis not present

## 2015-05-13 DIAGNOSIS — E785 Hyperlipidemia, unspecified: Secondary | ICD-10-CM | POA: Diagnosis not present

## 2015-05-13 DIAGNOSIS — I482 Chronic atrial fibrillation: Secondary | ICD-10-CM | POA: Diagnosis not present

## 2015-05-13 DIAGNOSIS — I2510 Atherosclerotic heart disease of native coronary artery without angina pectoris: Secondary | ICD-10-CM | POA: Diagnosis not present

## 2015-05-13 DIAGNOSIS — R079 Chest pain, unspecified: Secondary | ICD-10-CM | POA: Diagnosis not present

## 2015-05-14 DIAGNOSIS — Z87891 Personal history of nicotine dependence: Secondary | ICD-10-CM | POA: Diagnosis not present

## 2015-05-14 DIAGNOSIS — R079 Chest pain, unspecified: Secondary | ICD-10-CM | POA: Diagnosis not present

## 2015-05-14 DIAGNOSIS — Z791 Long term (current) use of non-steroidal anti-inflammatories (NSAID): Secondary | ICD-10-CM | POA: Diagnosis not present

## 2015-05-14 DIAGNOSIS — I519 Heart disease, unspecified: Secondary | ICD-10-CM | POA: Diagnosis not present

## 2015-05-14 DIAGNOSIS — I5189 Other ill-defined heart diseases: Secondary | ICD-10-CM | POA: Diagnosis not present

## 2015-05-14 DIAGNOSIS — R2242 Localized swelling, mass and lump, left lower limb: Secondary | ICD-10-CM | POA: Diagnosis present

## 2015-05-14 DIAGNOSIS — I482 Chronic atrial fibrillation: Secondary | ICD-10-CM | POA: Diagnosis not present

## 2015-05-14 DIAGNOSIS — Z7901 Long term (current) use of anticoagulants: Secondary | ICD-10-CM | POA: Diagnosis not present

## 2015-05-14 DIAGNOSIS — E785 Hyperlipidemia, unspecified: Secondary | ICD-10-CM | POA: Diagnosis present

## 2015-05-14 DIAGNOSIS — I2510 Atherosclerotic heart disease of native coronary artery without angina pectoris: Secondary | ICD-10-CM | POA: Diagnosis present

## 2015-05-14 DIAGNOSIS — Z79899 Other long term (current) drug therapy: Secondary | ICD-10-CM | POA: Diagnosis not present

## 2015-05-14 DIAGNOSIS — I2119 ST elevation (STEMI) myocardial infarction involving other coronary artery of inferior wall: Secondary | ICD-10-CM | POA: Diagnosis not present

## 2015-05-14 DIAGNOSIS — I213 ST elevation (STEMI) myocardial infarction of unspecified site: Secondary | ICD-10-CM | POA: Diagnosis not present

## 2015-05-14 DIAGNOSIS — I081 Rheumatic disorders of both mitral and tricuspid valves: Secondary | ICD-10-CM | POA: Diagnosis not present

## 2015-05-14 DIAGNOSIS — I959 Hypotension, unspecified: Secondary | ICD-10-CM | POA: Diagnosis present

## 2015-05-14 DIAGNOSIS — I517 Cardiomegaly: Secondary | ICD-10-CM | POA: Diagnosis not present

## 2015-05-18 ENCOUNTER — Telehealth: Payer: Self-pay | Admitting: Family Medicine

## 2015-05-18 NOTE — Telephone Encounter (Signed)
Patient scheduled an appointment for 4/25 @ 4:10 with [facility].

## 2015-05-19 DIAGNOSIS — Z7982 Long term (current) use of aspirin: Secondary | ICD-10-CM | POA: Diagnosis not present

## 2015-05-19 DIAGNOSIS — I482 Chronic atrial fibrillation: Secondary | ICD-10-CM | POA: Diagnosis not present

## 2015-05-19 DIAGNOSIS — I2510 Atherosclerotic heart disease of native coronary artery without angina pectoris: Secondary | ICD-10-CM | POA: Diagnosis not present

## 2015-05-19 DIAGNOSIS — I82611 Acute embolism and thrombosis of superficial veins of right upper extremity: Secondary | ICD-10-CM | POA: Diagnosis not present

## 2015-05-19 DIAGNOSIS — Z7901 Long term (current) use of anticoagulants: Secondary | ICD-10-CM | POA: Diagnosis not present

## 2015-05-19 DIAGNOSIS — L03115 Cellulitis of right lower limb: Secondary | ICD-10-CM | POA: Diagnosis not present

## 2015-05-19 DIAGNOSIS — I742 Embolism and thrombosis of arteries of the upper extremities: Secondary | ICD-10-CM | POA: Diagnosis not present

## 2015-05-19 DIAGNOSIS — N183 Chronic kidney disease, stage 3 (moderate): Secondary | ICD-10-CM | POA: Diagnosis not present

## 2015-05-19 DIAGNOSIS — M79609 Pain in unspecified limb: Secondary | ICD-10-CM | POA: Diagnosis not present

## 2015-05-19 DIAGNOSIS — N179 Acute kidney failure, unspecified: Secondary | ICD-10-CM | POA: Diagnosis not present

## 2015-05-19 DIAGNOSIS — K219 Gastro-esophageal reflux disease without esophagitis: Secondary | ICD-10-CM | POA: Diagnosis not present

## 2015-05-19 DIAGNOSIS — Z87891 Personal history of nicotine dependence: Secondary | ICD-10-CM | POA: Diagnosis not present

## 2015-05-19 DIAGNOSIS — I213 ST elevation (STEMI) myocardial infarction of unspecified site: Secondary | ICD-10-CM | POA: Diagnosis not present

## 2015-05-19 DIAGNOSIS — I340 Nonrheumatic mitral (valve) insufficiency: Secondary | ICD-10-CM | POA: Diagnosis not present

## 2015-05-19 DIAGNOSIS — I5022 Chronic systolic (congestive) heart failure: Secondary | ICD-10-CM | POA: Diagnosis not present

## 2015-05-19 DIAGNOSIS — J45909 Unspecified asthma, uncomplicated: Secondary | ICD-10-CM | POA: Diagnosis not present

## 2015-05-21 DIAGNOSIS — Z87891 Personal history of nicotine dependence: Secondary | ICD-10-CM | POA: Diagnosis not present

## 2015-05-21 DIAGNOSIS — Z79899 Other long term (current) drug therapy: Secondary | ICD-10-CM | POA: Diagnosis not present

## 2015-05-21 DIAGNOSIS — I5020 Unspecified systolic (congestive) heart failure: Secondary | ICD-10-CM | POA: Diagnosis not present

## 2015-05-21 DIAGNOSIS — I213 ST elevation (STEMI) myocardial infarction of unspecified site: Secondary | ICD-10-CM | POA: Diagnosis not present

## 2015-05-21 DIAGNOSIS — Z7982 Long term (current) use of aspirin: Secondary | ICD-10-CM | POA: Diagnosis not present

## 2015-05-21 DIAGNOSIS — L03115 Cellulitis of right lower limb: Secondary | ICD-10-CM | POA: Diagnosis not present

## 2015-05-21 DIAGNOSIS — I808 Phlebitis and thrombophlebitis of other sites: Secondary | ICD-10-CM | POA: Diagnosis not present

## 2015-05-21 DIAGNOSIS — N183 Chronic kidney disease, stage 3 (moderate): Secondary | ICD-10-CM | POA: Diagnosis not present

## 2015-05-21 DIAGNOSIS — K219 Gastro-esophageal reflux disease without esophagitis: Secondary | ICD-10-CM | POA: Diagnosis present

## 2015-05-21 DIAGNOSIS — I5022 Chronic systolic (congestive) heart failure: Secondary | ICD-10-CM | POA: Diagnosis not present

## 2015-05-21 DIAGNOSIS — I340 Nonrheumatic mitral (valve) insufficiency: Secondary | ICD-10-CM | POA: Diagnosis present

## 2015-05-21 DIAGNOSIS — I2510 Atherosclerotic heart disease of native coronary artery without angina pectoris: Secondary | ICD-10-CM | POA: Diagnosis present

## 2015-05-21 DIAGNOSIS — J45909 Unspecified asthma, uncomplicated: Secondary | ICD-10-CM | POA: Diagnosis present

## 2015-05-21 DIAGNOSIS — Z7901 Long term (current) use of anticoagulants: Secondary | ICD-10-CM | POA: Diagnosis not present

## 2015-05-21 DIAGNOSIS — S8991XA Unspecified injury of right lower leg, initial encounter: Secondary | ICD-10-CM | POA: Diagnosis not present

## 2015-05-21 DIAGNOSIS — I252 Old myocardial infarction: Secondary | ICD-10-CM | POA: Diagnosis not present

## 2015-05-21 DIAGNOSIS — I82611 Acute embolism and thrombosis of superficial veins of right upper extremity: Secondary | ICD-10-CM | POA: Diagnosis present

## 2015-05-21 DIAGNOSIS — M79609 Pain in unspecified limb: Secondary | ICD-10-CM | POA: Diagnosis not present

## 2015-05-21 DIAGNOSIS — I482 Chronic atrial fibrillation: Secondary | ICD-10-CM | POA: Diagnosis not present

## 2015-05-21 DIAGNOSIS — N179 Acute kidney failure, unspecified: Secondary | ICD-10-CM | POA: Diagnosis not present

## 2015-05-21 HISTORY — DX: Unspecified systolic (congestive) heart failure: I50.20

## 2015-05-22 DIAGNOSIS — I5020 Unspecified systolic (congestive) heart failure: Secondary | ICD-10-CM | POA: Diagnosis not present

## 2015-05-22 DIAGNOSIS — L03115 Cellulitis of right lower limb: Secondary | ICD-10-CM | POA: Diagnosis not present

## 2015-05-22 DIAGNOSIS — I252 Old myocardial infarction: Secondary | ICD-10-CM | POA: Diagnosis not present

## 2015-05-22 DIAGNOSIS — Z7901 Long term (current) use of anticoagulants: Secondary | ICD-10-CM | POA: Diagnosis not present

## 2015-05-22 DIAGNOSIS — I482 Chronic atrial fibrillation: Secondary | ICD-10-CM | POA: Diagnosis not present

## 2015-05-23 DIAGNOSIS — I2510 Atherosclerotic heart disease of native coronary artery without angina pectoris: Secondary | ICD-10-CM | POA: Diagnosis not present

## 2015-05-23 DIAGNOSIS — E782 Mixed hyperlipidemia: Secondary | ICD-10-CM | POA: Diagnosis not present

## 2015-05-23 DIAGNOSIS — I482 Chronic atrial fibrillation: Secondary | ICD-10-CM | POA: Diagnosis not present

## 2015-05-24 ENCOUNTER — Ambulatory Visit (INDEPENDENT_AMBULATORY_CARE_PROVIDER_SITE_OTHER): Payer: Medicare Other | Admitting: Family Medicine

## 2015-05-24 ENCOUNTER — Encounter: Payer: Self-pay | Admitting: Family Medicine

## 2015-05-24 VITALS — BP 98/62 | HR 106 | Temp 97.0°F | Ht 61.0 in | Wt 151.8 lb

## 2015-05-24 DIAGNOSIS — I2510 Atherosclerotic heart disease of native coronary artery without angina pectoris: Secondary | ICD-10-CM | POA: Diagnosis not present

## 2015-05-24 DIAGNOSIS — Z7901 Long term (current) use of anticoagulants: Secondary | ICD-10-CM | POA: Diagnosis not present

## 2015-05-24 DIAGNOSIS — I480 Paroxysmal atrial fibrillation: Secondary | ICD-10-CM

## 2015-05-24 DIAGNOSIS — I252 Old myocardial infarction: Secondary | ICD-10-CM | POA: Diagnosis not present

## 2015-05-24 NOTE — Progress Notes (Signed)
80 mg.  Subjective:  Patient ID: Dora Sims, female    DOB: 15-Apr-1933  Age: 82 y.o. MRN: [id number]  CC: Hospitalization Follow-up   HPI Lindell Tussey presents for recent hospitalization for MI. Placed on [place]. Atorvastatin increased from 20 to 80 mg. Pt. Confused about meds to take based on the changes made. On April 14 She had an episode of acute precordial pain. Felt she was about to  Pass out. Called for her son who called 37. She was transported to [facility]. Pain continued until arrival. Relief with NTG. Taken to cath lab. Mid RCA stent placed. Stenosis resolved. Left coronary system demonstrated no clincally significant stenosis.No urther chest pain incidents.   Patient in for follow-up of atrial fibrillation.  patient is taking anticoagulants. Patient denies any recent excessive bleeding episodes including epistaxis, bleeding from the gums, genitalia, rectal bleeding or hematuria. Additionally there has been no excessive bruising.  History Bradi has a past medical history of Postherpetic neuralgia; Osteoporosis; Constipation due to opioid therapy; and Clotting disorder ([place]).   She has no past surgical history on file.   Her family history is not on file.She reports that she quit smoking about 7 years ago. Her smoking use included Cigarettes. She started smoking about 46 years ago. She smoked 0.50 packs per day. She does not have any smokeless tobacco history on file. She reports that she does not drink alcohol or use illicit drugs.    ROS Review of Systems  Constitutional: Negative for fever, activity change and appetite change.  HENT: Negative for congestion, rhinorrhea and sore throat.   Eyes: Negative for visual disturbance.  Respiratory: Negative for cough and shortness of breath.   Cardiovascular:       See HPI   Gastrointestinal: Negative for nausea, abdominal pain and diarrhea.  Genitourinary: Negative for dysuria.  Musculoskeletal: Negative  for myalgias and arthralgias.    Objective:  BP 98/62 mmHg  Pulse 106  Temp(Src) 97 F (36.1 C) (Oral)  Ht '5\' 1"'  (1.549 m)  Wt 151 lb 12.8 oz (68.856 kg)  BMI 28.70 kg/m2  SpO2 99%  BP Readings from Last 3 Encounters:  05/24/15 98/62  04/11/15 125/79  01/04/15 135/66    Wt Readings from Last 3 Encounters:  05/24/15 151 lb 12.8 oz (68.856 kg)  04/11/15 160 lb (72.576 kg)  01/04/15 161 lb 6.4 oz (73.211 kg)     Physical Exam  Constitutional: She is oriented to person, place, and time. She appears well-developed and well-nourished. No distress.  HENT:  Head: Normocephalic and atraumatic.  Right Ear: External ear normal.  Left Ear: External ear normal.  Nose: Nose normal.  Mouth/Throat: Oropharynx is clear and moist.  Eyes: Conjunctivae and EOM are normal. Pupils are equal, round, and reactive to light.  Neck: Normal range of motion. Neck supple. No thyromegaly present.  Cardiovascular: Normal rate, regular rhythm and normal heart sounds.   No murmur heard. Pulmonary/Chest: Effort normal and breath sounds normal. No respiratory distress. She has no wheezes. She has no rales.  Abdominal: Soft. Bowel sounds are normal. She exhibits no distension. There is no tenderness.  Lymphadenopathy:    She has no cervical adenopathy.  Neurological: She is alert and oriented to person, place, and time. She has normal reflexes.  Skin: Skin is warm and dry.  Psychiatric: She has a normal mood and affect. Her behavior is normal. Judgment and thought content normal.     Lab Results  Component Value Date  WBC 8.2 05/24/2015   HCT 36.7 05/24/2015   PLT 323 05/24/2015   GLUCOSE 75 05/24/2015   ALT 29 05/24/2015   AST 51* 05/24/2015   NA 139 05/24/2015   K 4.5 05/24/2015   CL 98 05/24/2015   CREATININE 1.22* 05/24/2015   BUN 16 05/24/2015   CO2 23 05/24/2015    Patient was never admitted.  Assessment & Plan:   [place] was seen today for hospitalization follow-up.  Diagnoses  and all orders for this visit:  History of MI (myocardial infarction) -     CBC with Differential/Platelet -     CMP14+EGFR -     CBC with Differential/Platelet -     CMP14+EGFR  Long term current use of anticoagulant therapy -     CBC with Differential/Platelet -     CMP14+EGFR -     CBC with Differential/Platelet -     CMP14+EGFR  ASCVD (arteriosclerotic cardiovascular disease)  Paroxysmal atrial fibrillation ([place])      I have discontinued Ms. Sharps's doxycycline, ticagrelor, and cephALEXin. I am also having her maintain her LINZESS, montelukast, polyethylene glycol powder, metroNIDAZOLE, omeprazole, alendronate, methadone, mometasone, meloxicam, atorvastatin, gabapentin, lisinopril, Rivaroxaban, metoprolol succinate, and nitroGLYCERIN.  Meds ordered this encounter  Medications  . DISCONTD: doxycycline (VIBRA-TABS) 100 MG tablet    Sig: Take 100 mg by mouth 2 (two) times daily.   Marland Kitchen DISCONTD: ticagrelor (BRILINTA) 90 MG TABS tablet    Sig: Take 90 mg by mouth 2 (two) times daily.   Marland Kitchen DISCONTD: cephALEXin (KEFLEX) 500 MG capsule    Sig: Take 500 mg by mouth 3 (three) times daily.   Marland Kitchen lisinopril (PRINIVIL,ZESTRIL) 5 MG tablet    Sig: Take 5 mg by mouth daily.   . Rivaroxaban (XARELTO) 15 MG TABS tablet    Sig: Take 15 mg by mouth.   . metoprolol succinate (TOPROL-XL) 25 MG 24 hr tablet    Sig: Take 25 mg by mouth daily.   . nitroGLYCERIN (NITROSTAT) 0.4 MG SL tablet    Sig: Place under the tongue.     Follow-up: Return in about 1 month (around 06/23/2015).  Claretta Fraise, M.D.

## 2015-05-25 LAB — CMP14+EGFR
ALK PHOS: 75 IU/L (ref 39–117)
ALT: 29 IU/L (ref 0–32)
AST: 51 IU/L — AB (ref 0–40)
Albumin/Globulin Ratio: 1.1 — ABNORMAL LOW (ref 1.2–2.2)
Albumin: 4 g/dL (ref 3.5–4.7)
BILIRUBIN TOTAL: 0.6 mg/dL (ref 0.0–1.2)
BUN/Creatinine Ratio: 13 (ref 12–28)
BUN: 16 mg/dL (ref 8–27)
CHLORIDE: 98 mmol/L (ref 96–106)
CO2: 23 mmol/L (ref 18–29)
CREATININE: 1.22 mg/dL — AB (ref 0.57–1.00)
Calcium: 9.2 mg/dL (ref 8.7–10.3)
GFR calc Af Amer: 48 mL/min/{1.73_m2} — ABNORMAL LOW (ref >59)
GFR calc non Af Amer: 42 mL/min/{1.73_m2} — ABNORMAL LOW (ref >59)
GLUCOSE: 75 mg/dL (ref 65–99)
Globulin, Total: 3.5 g/dL (ref 1.5–4.5)
Potassium: 4.5 mmol/L (ref 3.5–5.2)
SODIUM: 139 mmol/L (ref 134–144)
Total Protein: 7.5 g/dL (ref 6.0–8.5)

## 2015-05-25 LAB — CBC WITH DIFFERENTIAL/PLATELET
BASOS ABS: 0 10*3/uL (ref 0.0–0.2)
Basos: 1 %
EOS (ABSOLUTE): 0.3 10*3/uL (ref 0.0–0.4)
Eos: 3 %
Hematocrit: 36.7 % (ref 34.0–46.6)
Hemoglobin: 12.1 g/dL (ref 11.1–15.9)
Immature Grans (Abs): 0 10*3/uL (ref 0.0–0.1)
Immature Granulocytes: 0 %
LYMPHS ABS: 1.9 10*3/uL (ref 0.7–3.1)
Lymphs: 23 %
MCH: 32.2 pg (ref 26.6–33.0)
MCHC: 33 g/dL (ref 31.5–35.7)
MCV: 98 fL — ABNORMAL HIGH (ref 79–97)
MONOS ABS: 0.7 10*3/uL (ref 0.1–0.9)
Monocytes: 9 %
NEUTROS ABS: 5.3 10*3/uL (ref 1.4–7.0)
Neutrophils: 64 %
Platelets: 323 10*3/uL (ref 150–379)
RBC: 3.76 x10E6/uL — AB (ref 3.77–5.28)
RDW: 13.3 % (ref 12.3–15.4)
WBC: 8.2 10*3/uL (ref 3.4–10.8)

## 2015-05-30 ENCOUNTER — Other Ambulatory Visit: Payer: Self-pay | Admitting: Family Medicine

## 2015-06-16 DIAGNOSIS — I2510 Atherosclerotic heart disease of native coronary artery without angina pectoris: Secondary | ICD-10-CM | POA: Diagnosis not present

## 2015-06-16 DIAGNOSIS — L298 Other pruritus: Secondary | ICD-10-CM | POA: Diagnosis not present

## 2015-06-20 DIAGNOSIS — L298 Other pruritus: Secondary | ICD-10-CM | POA: Diagnosis not present

## 2015-06-20 DIAGNOSIS — I2510 Atherosclerotic heart disease of native coronary artery without angina pectoris: Secondary | ICD-10-CM | POA: Diagnosis not present

## 2015-07-11 ENCOUNTER — Ambulatory Visit (INDEPENDENT_AMBULATORY_CARE_PROVIDER_SITE_OTHER): Payer: Medicare Other | Admitting: Family Medicine

## 2015-07-11 ENCOUNTER — Encounter: Payer: Self-pay | Admitting: Family Medicine

## 2015-07-11 ENCOUNTER — Ambulatory Visit: Payer: Medicare Other | Admitting: Family Medicine

## 2015-07-11 VITALS — BP 98/58 | HR 84 | Temp 97.3°F | Ht 61.0 in | Wt 153.6 lb

## 2015-07-11 DIAGNOSIS — I2510 Atherosclerotic heart disease of native coronary artery without angina pectoris: Secondary | ICD-10-CM | POA: Insufficient documentation

## 2015-07-11 DIAGNOSIS — B0229 Other postherpetic nervous system involvement: Secondary | ICD-10-CM

## 2015-07-11 DIAGNOSIS — I252 Old myocardial infarction: Secondary | ICD-10-CM | POA: Diagnosis not present

## 2015-07-11 DIAGNOSIS — I480 Paroxysmal atrial fibrillation: Secondary | ICD-10-CM | POA: Insufficient documentation

## 2015-07-11 MED ORDER — TICAGRELOR 90 MG PO TABS: 90.0000 mg | ORAL_TABLET | Freq: Two times a day (BID) | ORAL | Status: DC

## 2015-07-11 MED ORDER — METHADONE HCL 10 MG PO TABS: 10.0000 mg | ORAL_TABLET | Freq: Three times a day (TID) | ORAL | Status: DC

## 2015-07-11 NOTE — Patient Instructions (Signed)
Resume Brilinta. Discontinue Aspirin

## 2015-07-11 NOTE — Progress Notes (Signed)
Subjective:  Patient ID: Sheena Morrow, female    DOB: 17-Apr-1933  Age: 82 y.o. MRN: [id number]  CC: Pain   HPI [name] presents for Follow-up of her postherpetic neuralgia. For this she's been taking methadone 3 times a day for over a decade. The herpetic neuralgia remains stable. Pain is controlled between the 2-4/10 level as long as she takes her methadone. When asked what her pain would be like if she misses a dose she says she just never misses. In the past the pain was excruciating if she did. At this time she also is concerned about her heart. She is having no problems with chest pain but is concerned about follow-up due to her recent heart attack. She does not recall seeing Ms. Allison Quarry a 30 assistant at no follow-up health cardiology in late April. She states she would rather see a female cardiologist. Ms. Francella Solian. Clair's notes were reviewed as part of today's visit. Patient will resume Brilinta as a result.  Patient in for follow-up of atrial fibrillation. Patient denies any recent bouts of chest pain or palpitations. Additionally, patient is taking anticoagulants. Patient denies any recent excessive bleeding episodes including epistaxis, bleeding from the gums, genitalia, rectal bleeding or hematuria. Additionally there has been no excessive bruising.  History Rigley has a past medical history of Postherpetic neuralgia; Osteoporosis; Constipation due to opioid therapy; and Clotting disorder ([place]).   She has no past surgical history on file.   Her family history is not on file.She reports that she quit smoking about 7 years ago. Her smoking use included Cigarettes. She started smoking about 46 years ago. She smoked 0.50 packs per day. She does not have any smokeless tobacco history on file. She reports that she does not drink alcohol or use illicit drugs.    ROS Review of Systems  Constitutional: Negative for fever, activity change and appetite change.  HENT:  Negative for congestion, rhinorrhea and sore throat.   Eyes: Negative for visual disturbance.  Respiratory: Positive for shortness of breath. Negative for cough.   Cardiovascular: Negative for chest pain and palpitations.  Gastrointestinal: Negative for nausea, abdominal pain and diarrhea.  Genitourinary: Negative for dysuria.  Musculoskeletal: Positive for myalgias. Negative for arthralgias.    Objective:  BP 98/58 mmHg  Pulse 84  Temp(Src) 97.3 F (36.3 C) (Oral)  Ht 5\' 1"  (1.549 m)  Wt 153 lb 9.6 oz (69.673 kg)  BMI 29.04 kg/m2  SpO2 98%  BP Readings from Last 3 Encounters:  07/11/15 98/58  05/24/15 98/62  04/11/15 125/79    Wt Readings from Last 3 Encounters:  07/11/15 153 lb 9.6 oz (69.673 kg)  05/24/15 151 lb 12.8 oz (68.856 kg)  04/11/15 160 lb (72.576 kg)     Physical Exam  Constitutional: She is oriented to person, place, and time. She appears well-developed and well-nourished. No distress.  HENT:  Head: Normocephalic and atraumatic.  Right Ear: External ear normal.  Left Ear: External ear normal.  Nose: Nose normal.  Mouth/Throat: Oropharynx is clear and moist.  Eyes: Conjunctivae and EOM are normal. Pupils are equal, round, and reactive to light.  Neck: Normal range of motion. Neck supple. No thyromegaly present.  Cardiovascular: Normal rate and normal heart sounds.  An irregularly irregular rhythm present.  No murmur heard. Pulmonary/Chest: Effort normal and breath sounds normal. No respiratory distress. She has no wheezes. She has no rales.  Abdominal: Soft. Bowel sounds are normal. She exhibits no distension. There is no tenderness.  Lymphadenopathy:    She has no cervical adenopathy.  Neurological: She is alert and oriented to person, place, and time. She has normal reflexes.  Skin: Skin is warm and dry.  Psychiatric: She has a normal mood and affect. Her behavior is normal. Judgment and thought content normal.     Lab Results  Component Value  Date   WBC 8.2 05/24/2015   HCT 36.7 05/24/2015   PLT 323 05/24/2015   GLUCOSE 75 05/24/2015   ALT 29 05/24/2015   AST 51* 05/24/2015   NA 139 05/24/2015   K 4.5 05/24/2015   CL 98 05/24/2015   CREATININE 1.22* 05/24/2015   BUN 16 05/24/2015   CO2 23 05/24/2015    Patient was never admitted.  Assessment & Plan:   Neila was seen today for pain.  Diagnoses and all orders for this visit:  CAD in native artery -     Ambulatory referral to Cardiology  History of acute inferior wall MI -     Ambulatory referral to Cardiology  Paroxysmal atrial fibrillation (HCC)  Postherpetic neuralgia  Other orders -     methadone (DOLOPHINE) 10 MG tablet; Take 1 tablet (10 mg total) by mouth 3 (three) times daily. -     methadone (DOLOPHINE) 10 MG tablet; Take 1 tablet (10 mg total) by mouth 3 (three) times daily. -     methadone (DOLOPHINE) 10 MG tablet; Take 1 tablet (10 mg total) by mouth 3 (three) times daily. -     ticagrelor (BRILINTA) 90 MG TABS tablet; Take 1 tablet (90 mg total) by mouth 2 (two) times daily.     I am having Ms. Lemma start on methadone, methadone, and ticagrelor. I am also having her maintain her LINZESS, polyethylene glycol powder, metroNIDAZOLE, alendronate, mometasone, meloxicam, atorvastatin, gabapentin, lisinopril, Rivaroxaban, metoprolol succinate, nitroGLYCERIN, omeprazole, montelukast, and methadone.  Meds ordered this encounter  Medications  . methadone (DOLOPHINE) 10 MG tablet    Sig: Take 1 tablet (10 mg total) by mouth 3 (three) times daily.    Dispense:  90 tablet    Refill:  0  . methadone (DOLOPHINE) 10 MG tablet    Sig: Take 1 tablet (10 mg total) by mouth 3 (three) times daily.    Dispense:  90 tablet    Refill:  0    Do not fill until August 10, 2015  . methadone (DOLOPHINE) 10 MG tablet    Sig: Take 1 tablet (10 mg total) by mouth 3 (three) times daily.    Dispense:  90 tablet    Refill:  0    Do not fill until September 09, 2015  .  ticagrelor (BRILINTA) 90 MG TABS tablet    Sig: Take 1 tablet (90 mg total) by mouth 2 (two) times daily.    Dispense:  60 tablet    Refill:  5     Follow-up: Return in about 3 months (around 10/11/2015).  Claretta Fraise, M.D.

## 2015-07-15 ENCOUNTER — Other Ambulatory Visit: Payer: Self-pay | Admitting: Family Medicine

## 2015-07-21 DIAGNOSIS — I2510 Atherosclerotic heart disease of native coronary artery without angina pectoris: Secondary | ICD-10-CM | POA: Diagnosis not present

## 2015-07-21 DIAGNOSIS — I252 Old myocardial infarction: Secondary | ICD-10-CM | POA: Diagnosis not present

## 2015-07-21 DIAGNOSIS — I482 Chronic atrial fibrillation: Secondary | ICD-10-CM | POA: Diagnosis not present

## 2015-07-21 DIAGNOSIS — I5020 Unspecified systolic (congestive) heart failure: Secondary | ICD-10-CM | POA: Diagnosis not present

## 2015-07-21 DIAGNOSIS — R079 Chest pain, unspecified: Secondary | ICD-10-CM | POA: Diagnosis not present

## 2015-07-21 DIAGNOSIS — Z7901 Long term (current) use of anticoagulants: Secondary | ICD-10-CM | POA: Diagnosis not present

## 2015-07-21 DIAGNOSIS — R0602 Shortness of breath: Secondary | ICD-10-CM | POA: Diagnosis not present

## 2015-07-21 HISTORY — DX: Old myocardial infarction: I25.2

## 2015-10-11 ENCOUNTER — Ambulatory Visit: Payer: Medicare Other | Admitting: Family Medicine

## 2015-10-14 ENCOUNTER — Ambulatory Visit (INDEPENDENT_AMBULATORY_CARE_PROVIDER_SITE_OTHER): Payer: Medicare Other

## 2015-10-14 ENCOUNTER — Encounter: Payer: Self-pay | Admitting: Family Medicine

## 2015-10-14 ENCOUNTER — Ambulatory Visit (INDEPENDENT_AMBULATORY_CARE_PROVIDER_SITE_OTHER): Payer: Medicare Other | Admitting: Family Medicine

## 2015-10-14 VITALS — BP 96/51 | HR 86 | Temp 97.7°F | Ht 61.0 in | Wt 147.0 lb

## 2015-10-14 DIAGNOSIS — M810 Age-related osteoporosis without current pathological fracture: Secondary | ICD-10-CM | POA: Diagnosis not present

## 2015-10-14 DIAGNOSIS — R0600 Dyspnea, unspecified: Secondary | ICD-10-CM

## 2015-10-14 DIAGNOSIS — R899 Unspecified abnormal finding in specimens from other organs, systems and tissues: Secondary | ICD-10-CM | POA: Diagnosis not present

## 2015-10-14 DIAGNOSIS — Z0000 Encounter for general adult medical examination without abnormal findings: Secondary | ICD-10-CM | POA: Diagnosis not present

## 2015-10-14 DIAGNOSIS — H5347 Heteronymous bilateral field defects: Secondary | ICD-10-CM | POA: Diagnosis not present

## 2015-10-14 DIAGNOSIS — I252 Old myocardial infarction: Secondary | ICD-10-CM | POA: Diagnosis not present

## 2015-10-14 DIAGNOSIS — Z7901 Long term (current) use of anticoagulants: Secondary | ICD-10-CM | POA: Diagnosis not present

## 2015-10-14 DIAGNOSIS — B0229 Other postherpetic nervous system involvement: Secondary | ICD-10-CM | POA: Diagnosis not present

## 2015-10-14 DIAGNOSIS — D509 Iron deficiency anemia, unspecified: Secondary | ICD-10-CM | POA: Diagnosis not present

## 2015-10-14 DIAGNOSIS — R6889 Other general symptoms and signs: Secondary | ICD-10-CM | POA: Diagnosis not present

## 2015-10-14 DIAGNOSIS — I2510 Atherosclerotic heart disease of native coronary artery without angina pectoris: Secondary | ICD-10-CM | POA: Diagnosis not present

## 2015-10-14 MED ORDER — RALOXIFENE HCL 60 MG PO TABS: 60.0000 mg | ORAL_TABLET | Freq: Every day | ORAL | 5 refills | Status: DC

## 2015-10-14 MED ORDER — METHADONE HCL 10 MG PO TABS: 10.0000 mg | ORAL_TABLET | Freq: Three times a day (TID) | ORAL | 0 refills | Status: DC

## 2015-10-14 NOTE — Progress Notes (Addendum)
Subjective:  Patient ID: Sheena Morrow, female    DOB: 1933-05-07  Age: 82 y.o. MRN: [id number]  CC: Hypertension (pt here today following up for her HTN and also needs medication refills on her methadone)   HPI Tal Neer presents for dyspnea every afternoon since her heart attack. No orthopnea. Some ankle edema.   Pain assessment: Cause of pain-  Pain location- mid back  Pain on scale of 1-10- 3-5 (log attachd, reviewed Frequency- What increases pain-nothing What makes pain Better-nothing Effects on ADL - minimal Any change in general medical condition- recent MI  Current medications- see list Methadone 10 mg TID Effectiveness of current meds-adequate Adverse reactions form pain meds-constipation Morphine equivalent240  Pill count performed-No Urine drug screen- No Was the [place] reviewed- no - problem getting into system  If yes were their any concerning findings? - N/A    History Jessic has a past medical history of Clotting disorder ([place]); Constipation due to opioid therapy; Osteoporosis; and Postherpetic neuralgia.   She has no past surgical history on file.   Her family history is not on file.She reports that she quit smoking about 7 years ago. Her smoking use included Cigarettes. She started smoking about 46 years ago. She smoked 0.50 packs per day. She has never used smokeless tobacco. She reports that she does not drink alcohol or use drugs.    ROS Review of Systems  Constitutional: Negative for activity change, appetite change and fever.  HENT: Negative for congestion, rhinorrhea and sore throat.   Eyes: Negative for visual disturbance.  Respiratory: Negative for cough and shortness of breath.   Cardiovascular: Negative for chest pain and palpitations.  Gastrointestinal: Negative for abdominal pain, diarrhea and nausea.  Genitourinary: Negative for dysuria.  Musculoskeletal: Negative for arthralgias and myalgias.    Objective:  BP (!) 96/51   Pulse  86   Temp 97.7 F (36.5 C) (Oral)   Ht '5\' 1"'  (1.549 m)   Wt 147 lb (66.7 kg)   BMI 27.78 kg/m   BP Readings from Last 3 Encounters:  10/14/15 (!) 96/51  07/11/15 (!) 98/58  05/24/15 98/62    Wt Readings from Last 3 Encounters:  10/14/15 147 lb (66.7 kg)  07/11/15 153 lb 9.6 oz (69.7 kg)  05/24/15 151 lb 12.8 oz (68.9 kg)     Physical Exam  Constitutional: She is oriented to person, place, and time. She appears well-developed and well-nourished. No distress.  HENT:  Head: Normocephalic and atraumatic.  Right Ear: External ear normal.  Left Ear: External ear normal.  Nose: Nose normal.  Mouth/Throat: Oropharynx is clear and moist.  Eyes: Conjunctivae and EOM are normal. Pupils are equal, round, and reactive to light.  Neck: Normal range of motion. Neck supple. No thyromegaly present.  Cardiovascular: Normal rate, regular rhythm and normal heart sounds.   No murmur heard. Pulmonary/Chest: Effort normal and breath sounds normal. No respiratory distress. She has no wheezes. She has no rales.  Abdominal: Soft. Bowel sounds are normal. She exhibits no distension. There is no tenderness.  Lymphadenopathy:    She has no cervical adenopathy.  Neurological: She is alert and oriented to person, place, and time. She has normal reflexes.  Skin: Skin is warm and dry.  Psychiatric: She has a normal mood and affect. Her behavior is normal. Judgment and thought content normal.     Lab Results  Component Value Date   WBC 6.8 10/14/2015   HCT 30.6 (L) 10/14/2015   PLT 249 10/14/2015  GLUCOSE 83 10/14/2015   ALT 22 10/14/2015   AST 45 (H) 10/14/2015   NA 136 10/14/2015   K 5.3 (H) 10/14/2015   CL 98 10/14/2015   CREATININE 1.53 (H) 10/14/2015   BUN 34 (H) 10/14/2015   CO2 25 10/14/2015    Patient was never admitted.  Assessment & Plan:   Mitzy was seen today for hypertension.  Diagnoses and all orders for this visit:  Dyspnea -     Brain natriuretic peptide -     CBC  with Differential/Platelet -     CMP14+EGFR -     DG Chest 2 View; Future -     EKG 12-Lead -     PR BREATHING CAPACITY TEST  CAD in native artery  Long term current use of anticoagulant therapy  Postherpetic neuralgia -     methadone (DOLOPHINE) 10 MG tablet; Take 1 tablet (10 mg total) by mouth 3 (three) times daily. -     methadone (DOLOPHINE) 10 MG tablet; Take 1 tablet (10 mg total) by mouth 3 (three) times daily. -     methadone (DOLOPHINE) 10 MG tablet; Take 1 tablet (10 mg total) by mouth 3 (three) times daily.  History of acute inferior wall MI  Hemianopia, bitemporal -     Ambulatory referral to Ophthalmology  Anemia, iron deficiency -     Fe+TIBC+Fer+B12+Folic -     Specimen status report -     Specimen status report  Osteoporosis -     raloxifene (EVISTA) 60 MG tablet; Take 1 tablet (60 mg total) by mouth daily. For bone health  EKG, irreg, irreg atrial rhythm without acute ischemic change  I have discontinued Ms. Hefner's metroNIDAZOLE, alendronate, and ticagrelor. I am also having her start on raloxifene. Additionally, I am having her maintain her LINZESS, mometasone, meloxicam, atorvastatin, gabapentin, lisinopril, Rivaroxaban, metoprolol succinate, nitroGLYCERIN, omeprazole, montelukast, polyethylene glycol powder, clopidogrel, methadone, methadone, and methadone.  Meds ordered this encounter  Medications  . clopidogrel (PLAVIX) 75 MG tablet    Sig: Take by mouth.  . methadone (DOLOPHINE) 10 MG tablet    Sig: Take 1 tablet (10 mg total) by mouth 3 (three) times daily.    Dispense:  90 tablet    Refill:  0  . methadone (DOLOPHINE) 10 MG tablet    Sig: Take 1 tablet (10 mg total) by mouth 3 (three) times daily.    Dispense:  90 tablet    Refill:  0    Do not fill until Oct. 15, 2017  . methadone (DOLOPHINE) 10 MG tablet    Sig: Take 1 tablet (10 mg total) by mouth 3 (three) times daily.    Dispense:  90 tablet    Refill:  0    Do not fill until Nov. 14,  2017  . raloxifene (EVISTA) 60 MG tablet    Sig: Take 1 tablet (60 mg total) by mouth daily. For bone health    Dispense:  30 tablet    Refill:  5     Follow-up: Return in about 3 months (around 01/13/2016).  Claretta Fraise, M.D.

## 2015-10-15 LAB — CMP14+EGFR
ALK PHOS: 64 IU/L (ref 39–117)
ALT: 22 IU/L (ref 0–32)
AST: 45 IU/L — AB (ref 0–40)
Albumin/Globulin Ratio: 1.2 (ref 1.2–2.2)
Albumin: 3.7 g/dL (ref 3.5–4.7)
BILIRUBIN TOTAL: 0.3 mg/dL (ref 0.0–1.2)
BUN/Creatinine Ratio: 22 (ref 12–28)
BUN: 34 mg/dL — AB (ref 8–27)
CHLORIDE: 98 mmol/L (ref 96–106)
CO2: 25 mmol/L (ref 18–29)
CREATININE: 1.53 mg/dL — AB (ref 0.57–1.00)
Calcium: 8.6 mg/dL — ABNORMAL LOW (ref 8.7–10.3)
GFR calc Af Amer: 37 mL/min/{1.73_m2} — ABNORMAL LOW (ref >59)
GFR calc non Af Amer: 32 mL/min/{1.73_m2} — ABNORMAL LOW (ref >59)
GLOBULIN, TOTAL: 3.2 g/dL (ref 1.5–4.5)
GLUCOSE: 83 mg/dL (ref 65–99)
Potassium: 5.3 mmol/L — ABNORMAL HIGH (ref 3.5–5.2)
SODIUM: 136 mmol/L (ref 134–144)
Total Protein: 6.9 g/dL (ref 6.0–8.5)

## 2015-10-15 LAB — CBC WITH DIFFERENTIAL/PLATELET
BASOS: 1 %
Basophils Absolute: 0 10*3/uL (ref 0.0–0.2)
EOS (ABSOLUTE): 0.2 10*3/uL (ref 0.0–0.4)
EOS: 3 %
Hematocrit: 30.6 % — ABNORMAL LOW (ref 34.0–46.6)
Hemoglobin: 10 g/dL — ABNORMAL LOW (ref 11.1–15.9)
IMMATURE GRANULOCYTES: 0 %
Immature Grans (Abs): 0 10*3/uL (ref 0.0–0.1)
LYMPHS ABS: 2.5 10*3/uL (ref 0.7–3.1)
Lymphs: 37 %
MCH: 32.6 pg (ref 26.6–33.0)
MCHC: 32.7 g/dL (ref 31.5–35.7)
MCV: 100 fL — ABNORMAL HIGH (ref 79–97)
MONOS ABS: 0.4 10*3/uL (ref 0.1–0.9)
Monocytes: 6 %
NEUTROS PCT: 53 %
Neutrophils Absolute: 3.6 10*3/uL (ref 1.4–7.0)
Platelets: 249 10*3/uL (ref 150–379)
RBC: 3.07 x10E6/uL — ABNORMAL LOW (ref 3.77–5.28)
RDW: 14.2 % (ref 12.3–15.4)
WBC: 6.8 10*3/uL (ref 3.4–10.8)

## 2015-10-15 LAB — BRAIN NATRIURETIC PEPTIDE: BNP: 218.5 pg/mL — ABNORMAL HIGH (ref 0.0–100.0)

## 2015-10-18 LAB — SPECIMEN STATUS REPORT

## 2015-10-19 LAB — FE+TIBC+FER+B12+FOLIC
FERRITIN: 117 ng/mL (ref 15–150)
Folate: >20 ng/mL (ref >3.0)
IRON SATURATION: 26 % (ref 15–55)
Iron: 72 ug/dL (ref 27–139)
Total Iron Binding Capacity: 277 ug/dL (ref 250–450)
UIBC: 205 ug/dL (ref 118–369)
VITAMIN B 12: 471 pg/mL (ref 211–946)

## 2015-10-19 LAB — SPECIMEN STATUS REPORT

## 2015-10-27 DIAGNOSIS — I482 Chronic atrial fibrillation: Secondary | ICD-10-CM | POA: Diagnosis not present

## 2015-10-27 DIAGNOSIS — Z7901 Long term (current) use of anticoagulants: Secondary | ICD-10-CM | POA: Diagnosis not present

## 2015-10-27 DIAGNOSIS — I5020 Unspecified systolic (congestive) heart failure: Secondary | ICD-10-CM | POA: Diagnosis not present

## 2015-10-27 DIAGNOSIS — I2510 Atherosclerotic heart disease of native coronary artery without angina pectoris: Secondary | ICD-10-CM | POA: Diagnosis not present

## 2015-10-27 DIAGNOSIS — I2119 ST elevation (STEMI) myocardial infarction involving other coronary artery of inferior wall: Secondary | ICD-10-CM | POA: Diagnosis not present

## 2015-10-27 DIAGNOSIS — I519 Heart disease, unspecified: Secondary | ICD-10-CM | POA: Diagnosis not present

## 2015-11-02 ENCOUNTER — Telehealth: Payer: Self-pay | Admitting: Family Medicine

## 2015-11-02 DIAGNOSIS — H25013 Cortical age-related cataract, bilateral: Secondary | ICD-10-CM | POA: Diagnosis not present

## 2015-11-02 DIAGNOSIS — H16223 Keratoconjunctivitis sicca, not specified as Sjogren's, bilateral: Secondary | ICD-10-CM | POA: Diagnosis not present

## 2015-11-02 DIAGNOSIS — H2513 Age-related nuclear cataract, bilateral: Secondary | ICD-10-CM | POA: Diagnosis not present

## 2015-11-02 DIAGNOSIS — H53462 Homonymous bilateral field defects, left side: Secondary | ICD-10-CM | POA: Diagnosis not present

## 2015-11-02 NOTE — Telephone Encounter (Signed)
Scheduled appt Notified [facility], they will fax records

## 2015-11-03 ENCOUNTER — Ambulatory Visit (INDEPENDENT_AMBULATORY_CARE_PROVIDER_SITE_OTHER): Payer: Medicare Other | Admitting: Family Medicine

## 2015-11-03 ENCOUNTER — Encounter: Payer: Self-pay | Admitting: Family Medicine

## 2015-11-03 VITALS — BP 101/65 | HR 86 | Temp 97.7°F | Ht 61.0 in | Wt 150.4 lb

## 2015-11-03 DIAGNOSIS — M810 Age-related osteoporosis without current pathological fracture: Secondary | ICD-10-CM | POA: Diagnosis not present

## 2015-11-03 DIAGNOSIS — I2510 Atherosclerotic heart disease of native coronary artery without angina pectoris: Secondary | ICD-10-CM | POA: Diagnosis not present

## 2015-11-03 DIAGNOSIS — H53462 Homonymous bilateral field defects, left side: Secondary | ICD-10-CM | POA: Diagnosis not present

## 2015-11-03 DIAGNOSIS — B0229 Other postherpetic nervous system involvement: Secondary | ICD-10-CM

## 2015-11-03 DIAGNOSIS — R6889 Other general symptoms and signs: Secondary | ICD-10-CM | POA: Diagnosis not present

## 2015-11-03 DIAGNOSIS — D508 Other iron deficiency anemias: Secondary | ICD-10-CM

## 2015-11-03 DIAGNOSIS — Z23 Encounter for immunization: Secondary | ICD-10-CM | POA: Diagnosis not present

## 2015-11-03 NOTE — Progress Notes (Signed)
Subjective:  Patient ID: Sheena Morrow, female    DOB: 1933/03/28  Age: 82 y.o. MRN: [id number]  CC: Eye Problem (pt here after seeing Eye Doctor at [facility] and he wants her to have a work up for a possible history of stroke)   HPI Sheena Morrow presents for Follow-up from primary yesterday at [facility], Dr. Margaretmary Dys. She was noted to have a left homonymous hemianopsia. They were concerned that a stroke may have occurred. They asked her to follow-up here for imaging and workup. She states that she noted that she can't see the left of her field of vision and she holds her hand up about 45 off to the left of her left eye. She thought she just had floaters. She says just some black spots showed up several months ago. She had not mention them in the past because she didn't think they would last. She thought it would go away.   History Sheena Morrow has a past medical history of Clotting disorder ([place]); Constipation due to opioid therapy; Old MI (myocardial infarction) (07/21/2015); Osteoporosis; Postherpetic neuralgia; and Systolic CHF with reduced left ventricular function, NYHA class 2 ([place]) (05/21/2015).   She has no past surgical history on file.   Her family history is not on file.She reports that she quit smoking about 7 years ago. Her smoking use included Cigarettes. She started smoking about 46 years ago. She smoked 0.50 packs per day. She has never used smokeless tobacco. She reports that she does not drink alcohol or use drugs. f   ROS Review of Systems  Constitutional: Negative for activity change, appetite change and fever.  HENT: Negative for congestion, rhinorrhea and sore throat.   Eyes: Negative for visual disturbance.  Respiratory: Negative for cough and shortness of breath.   Cardiovascular: Negative for chest pain and palpitations.  Gastrointestinal: Negative for abdominal pain, diarrhea and nausea.  Genitourinary: Negative for dysuria.  Musculoskeletal: Negative for arthralgias and  myalgias.    Objective:  BP 101/65   Pulse 86   Temp 97.7 F (36.5 C) (Oral)   Ht 5\' 1"  (1.549 m)   Wt 150 lb 6 oz (68.2 kg)   BMI 28.41 kg/m   BP Readings from Last 3 Encounters:  11/03/15 101/65  10/14/15 (!) 96/51  07/11/15 (!) 98/58    Wt Readings from Last 3 Encounters:  11/03/15 150 lb 6 oz (68.2 kg)  10/14/15 147 lb (66.7 kg)  07/11/15 153 lb 9.6 oz (69.7 kg)     Physical Exam  Constitutional: She is oriented to person, place, and time.  Musculoskeletal: Normal range of motion. She exhibits no edema or tenderness.  Neurological: She is alert and oriented to person, place, and time. She displays normal reflexes. No cranial nerve deficit. She exhibits normal muscle tone. Coordination normal.  Skin: Skin is warm and dry.  Psychiatric: She has a normal mood and affect.     Lab Results  Component Value Date   WBC 6.8 10/14/2015   HCT 30.6 (L) 10/14/2015   PLT 249 10/14/2015   GLUCOSE 83 10/14/2015   ALT 22 10/14/2015   AST 45 (H) 10/14/2015   NA 136 10/14/2015   K 5.3 (H) 10/14/2015   CL 98 10/14/2015   CREATININE 1.53 (H) 10/14/2015   BUN 34 (H) 10/14/2015   CO2 25 10/14/2015    Patient was never admitted.  Assessment & Plan:   [name] was seen today for eye problem.  Diagnoses and all orders for this visit:  Left homonymous hemianopsia -     MR Brain W Wo Contrast; Future -     Ambulatory referral to Neurology  Postherpetic neuralgia  Other iron deficiency anemia -     Anemia Profile B -     CBC with Differential/Platelet   DDX includes pituitary tumor, CVA  I am having Sheena Morrow maintain her LINZESS, mometasone, meloxicam, atorvastatin, gabapentin, lisinopril, Rivaroxaban, metoprolol succinate, nitroGLYCERIN, omeprazole, montelukast, polyethylene glycol powder, clopidogrel, methadone, methadone, methadone, and raloxifene.  No orders of the defined types were placed in this encounter.    Follow-up: Return in about 2 months (around  01/03/2016).  Claretta Fraise, M.D.

## 2015-11-04 LAB — ANEMIA PROFILE B
BASOS: 1 %
Basophils Absolute: 0.1 10*3/uL (ref 0.0–0.2)
EOS (ABSOLUTE): 0.3 10*3/uL (ref 0.0–0.4)
EOS: 6 %
FERRITIN: 58 ng/mL (ref 15–150)
Folate: >20 ng/mL (ref >3.0)
HEMATOCRIT: 28.5 % — AB (ref 34.0–46.6)
HEMOGLOBIN: 8.9 g/dL — AB (ref 11.1–15.9)
IMMATURE GRANS (ABS): 0 10*3/uL (ref 0.0–0.1)
IMMATURE GRANULOCYTES: 0 %
IRON SATURATION: 16 % (ref 15–55)
Iron: 47 ug/dL (ref 27–139)
LYMPHS: 41 %
Lymphocytes Absolute: 2.4 10*3/uL (ref 0.7–3.1)
MCH: 31.9 pg (ref 26.6–33.0)
MCHC: 31.2 g/dL — ABNORMAL LOW (ref 31.5–35.7)
MCV: 102 fL — ABNORMAL HIGH (ref 79–97)
MONOCYTES: 8 %
Monocytes Absolute: 0.4 10*3/uL (ref 0.1–0.9)
Neutrophils Absolute: 2.6 10*3/uL (ref 1.4–7.0)
Neutrophils: 44 %
Platelets: 239 10*3/uL (ref 150–379)
RBC: 2.79 x10E6/uL — AB (ref 3.77–5.28)
RDW: 14.8 % (ref 12.3–15.4)
RETIC CT PCT: 4 % — AB (ref 0.6–2.6)
Total Iron Binding Capacity: 293 ug/dL (ref 250–450)
UIBC: 246 ug/dL (ref 118–369)
Vitamin B-12: 706 pg/mL (ref 211–946)
WBC: 5.8 10*3/uL (ref 3.4–10.8)

## 2015-11-08 NOTE — Progress Notes (Signed)
Your chest x-ray looked normal. Thanks, WS.

## 2015-11-13 DIAGNOSIS — G9389 Other specified disorders of brain: Secondary | ICD-10-CM | POA: Diagnosis not present

## 2015-12-06 DIAGNOSIS — L658 Other specified nonscarring hair loss: Secondary | ICD-10-CM | POA: Diagnosis not present

## 2015-12-06 DIAGNOSIS — L139 Bullous disorder, unspecified: Secondary | ICD-10-CM | POA: Diagnosis not present

## 2015-12-06 DIAGNOSIS — I2510 Atherosclerotic heart disease of native coronary artery without angina pectoris: Secondary | ICD-10-CM | POA: Diagnosis not present

## 2015-12-08 ENCOUNTER — Telehealth: Payer: Self-pay | Admitting: *Deleted

## 2015-12-08 DIAGNOSIS — I482 Chronic atrial fibrillation: Secondary | ICD-10-CM | POA: Diagnosis not present

## 2015-12-08 DIAGNOSIS — I213 ST elevation (STEMI) myocardial infarction of unspecified site: Secondary | ICD-10-CM | POA: Diagnosis not present

## 2015-12-08 DIAGNOSIS — I2510 Atherosclerotic heart disease of native coronary artery without angina pectoris: Secondary | ICD-10-CM | POA: Diagnosis not present

## 2015-12-08 DIAGNOSIS — I5020 Unspecified systolic (congestive) heart failure: Secondary | ICD-10-CM | POA: Diagnosis not present

## 2015-12-08 DIAGNOSIS — Z7901 Long term (current) use of anticoagulants: Secondary | ICD-10-CM | POA: Diagnosis not present

## 2015-12-08 DIAGNOSIS — L509 Urticaria, unspecified: Secondary | ICD-10-CM | POA: Diagnosis not present

## 2015-12-08 DIAGNOSIS — Z5189 Encounter for other specified aftercare: Secondary | ICD-10-CM | POA: Diagnosis not present

## 2015-12-08 NOTE — Telephone Encounter (Signed)
Yes, pt should come back in for office visit and Hg recheck.

## 2015-12-08 NOTE — Telephone Encounter (Signed)
Patient saw Dr Sheena Morrow, a cardiologist with [facility] today. I received a call from her nurse who was concerned that Ms Sheena Morrow is taking Mobic, ASA, and Xarelto daily and her last hemoglobin was 8.9 on 11/03/15 which was decreased from 10.0 on 10/14/15. She is being treated with iron. Dr Sheena Morrow wanted to suggest that these meds may be causing some bleeding resulting in anemia and wanted to see if Dr Sheena Morrow would like to discontinue either Mobic or ASA. I actually don't see ASA on our med list but she reported to Dr Sheena Morrow that she is taking it. Do you think it's reasonable for her to come in for a repeat CBC to see if her hemoglobin is stable at this point and possibly do an FOBT? She denied black, tarry or bright red stools at her appt today.    Can you give me your thoughts since Dr Sheena Morrow is out of the office?

## 2015-12-09 NOTE — Telephone Encounter (Signed)
lmtcb

## 2015-12-13 DIAGNOSIS — L139 Bullous disorder, unspecified: Secondary | ICD-10-CM | POA: Diagnosis not present

## 2015-12-13 DIAGNOSIS — Z4802 Encounter for removal of sutures: Secondary | ICD-10-CM | POA: Diagnosis not present

## 2015-12-15 DIAGNOSIS — L120 Bullous pemphigoid: Secondary | ICD-10-CM | POA: Diagnosis not present

## 2015-12-16 NOTE — Telephone Encounter (Signed)
Spoke with patient and advised her of need for followup appointment.  Patient wanted to wait until her appointment in December but I told her of the importance of being seen.  I tried to make the patient an appointment on 12/19/15 at 10:25 am but she said she could not come until Wednesday at 12:55 pm, appointment was schedule then.

## 2015-12-19 ENCOUNTER — Other Ambulatory Visit: Payer: Self-pay | Admitting: Family Medicine

## 2015-12-19 DIAGNOSIS — L139 Bullous disorder, unspecified: Secondary | ICD-10-CM | POA: Diagnosis not present

## 2015-12-19 MED ORDER — MINOCYCLINE HCL 100 MG PO CAPS: 100.0000 mg | ORAL_CAPSULE | Freq: Two times a day (BID) | ORAL | 5 refills | Status: DC

## 2015-12-21 ENCOUNTER — Ambulatory Visit (INDEPENDENT_AMBULATORY_CARE_PROVIDER_SITE_OTHER): Payer: Medicare Other | Admitting: Family Medicine

## 2015-12-21 ENCOUNTER — Encounter: Payer: Self-pay | Admitting: Family Medicine

## 2015-12-21 VITALS — BP 103/65 | HR 93 | Temp 97.6°F | Ht 61.0 in | Wt 145.0 lb

## 2015-12-21 DIAGNOSIS — D509 Iron deficiency anemia, unspecified: Secondary | ICD-10-CM | POA: Diagnosis not present

## 2015-12-21 DIAGNOSIS — H53462 Homonymous bilateral field defects, left side: Secondary | ICD-10-CM

## 2015-12-21 DIAGNOSIS — I2510 Atherosclerotic heart disease of native coronary artery without angina pectoris: Secondary | ICD-10-CM | POA: Diagnosis not present

## 2015-12-21 DIAGNOSIS — I480 Paroxysmal atrial fibrillation: Secondary | ICD-10-CM

## 2015-12-21 LAB — FINGERSTICK HEMOGLOBIN: Hemoglobin: 11.6 g/dL (ref 11.1–15.9)

## 2015-12-21 MED ORDER — FERROUS FUMARATE 324 (106 FE) MG PO TABS: 106.0000 mg | ORAL_TABLET | Freq: Two times a day (BID) | ORAL | 5 refills | Status: DC

## 2015-12-21 NOTE — Progress Notes (Signed)
Subjective:  Patient ID: Sheena Morrow, female    DOB: 03/16/1933  Age: 82 y.o. MRN: [id number]  CC: Anemia (pt here today for a recheck on her hgb)   HPI Sheena Morrow presents for Continued problems with vision. The vision is not clear but it is unchanged from last exam. Of note is that she went to ophthalmology and visual fields noting deficits. That report is attached. MRI report is attached. It shows that she has had bilateral occipital strokes. These are old. They had no findings suggesting they are acute. Also some posterior temporal lobe encephalomalacia as well. Also had a low hemoglobin at last visit here for recheck of that.   History Sheena Morrow has a past medical history of Clotting disorder ([place]); Constipation due to opioid therapy; Old MI (myocardial infarction) (07/21/2015); Osteoporosis; Postherpetic neuralgia; and Systolic CHF with reduced left ventricular function, NYHA class 2 ([place]) (05/21/2015).   She has no past surgical history on file.   Her family history is not on file.She reports that she quit smoking about 7 years ago. Her smoking use included Cigarettes. She started smoking about 46 years ago. She smoked 0.50 packs per day. She has never used smokeless tobacco. She reports that she does not drink alcohol or use drugs.      Medication List       Accurate as of 12/21/15  5:55 PM. Always use your most recent med list.          atorvastatin 20 MG tablet Commonly known as:  LIPITOR TAKE 1 TABLET BY MOUTH EVERY DAY   Ferrous Fumarate 324 (106 Fe) MG Tabs tablet Commonly known as:  HEMOCYTE - 106 mg FE Take 1 tablet (106 mg of iron total) by mouth 2 (two) times daily. For iron deficiency anemia   gabapentin 800 MG tablet Commonly known as:  NEURONTIN TAKE 1 TABLET EACH MORNING,TAKE 1 TABLET EACH AFTERNOON & 2 TABLETS EACH EVENING   LINZESS 145 MCG Caps capsule Generic drug:  linaclotide Take 1 tablet by mouth 2 (two) times daily.   lisinopril 5 MG  tablet Commonly known as:  PRINIVIL,ZESTRIL Take 5 mg by mouth daily.   meloxicam 15 MG tablet Commonly known as:  MOBIC TAKE 1 TABLET BY MOUTH EVERY DAY WITH FOOD FOR BACK PAIN   methadone 10 MG tablet Commonly known as:  DOLOPHINE Take 1 tablet (10 mg total) by mouth 3 (three) times daily.   methadone 10 MG tablet Commonly known as:  DOLOPHINE Take 1 tablet (10 mg total) by mouth 3 (three) times daily.   methadone 10 MG tablet Commonly known as:  DOLOPHINE Take 1 tablet (10 mg total) by mouth 3 (three) times daily.   metoprolol succinate 25 MG 24 hr tablet Commonly known as:  TOPROL-XL Take 25 mg by mouth daily.   minocycline 100 MG capsule Commonly known as:  MINOCIN Take 1 capsule (100 mg total) by mouth 2 (two) times daily.   mometasone 0.1 % cream Commonly known as:  ELOCON To affected areas   montelukast 10 MG tablet Commonly known as:  SINGULAIR TAKE 1 TABLET BY MOUTH AT BEDTIME   nitroGLYCERIN 0.4 MG SL tablet Commonly known as:  NITROSTAT Place under the tongue.   omeprazole 20 MG capsule Commonly known as:  PRILOSEC TAKE ONE CAPSULE EVERY DAY   polyethylene glycol powder powder Commonly known as:  GLYCOLAX/MIRALAX USE 1 DOSING CUP ORALLY IN 8 OZ. OF WATER DAILY   raloxifene 60 MG tablet Commonly known as:  EVISTA Take 1 tablet (60 mg total) by mouth daily. For bone health   Rivaroxaban 15 MG Tabs tablet Commonly known as:  XARELTO Take 15 mg by mouth.   triamcinolone cream 0.1 % Commonly known as:  KENALOG APPLY TOPICALLY 2 (TWO) TIMES DAILY. TO THE SCALP AS NEEDED        ROS Review of Systems  Constitutional: Negative for activity change, appetite change and fever.  HENT: Negative for congestion, rhinorrhea and sore throat.   Eyes: Negative for visual disturbance.  Respiratory: Negative for cough and shortness of breath.   Cardiovascular: Negative for chest pain and palpitations.  Gastrointestinal: Negative for abdominal pain,  diarrhea and nausea.  Genitourinary: Negative for dysuria.  Musculoskeletal: Negative for arthralgias and myalgias.    Objective:  BP 103/65   Pulse 93   Temp 97.6 F (36.4 C) (Oral)   Ht 5\' 1"  (1.549 m)   Wt 145 lb (65.8 kg)   BMI 27.40 kg/m   BP Readings from Last 3 Encounters:  12/21/15 103/65  11/03/15 101/65  10/14/15 (!) 96/51    Wt Readings from Last 3 Encounters:  12/21/15 145 lb (65.8 kg)  11/03/15 150 lb 6 oz (68.2 kg)  10/14/15 147 lb (66.7 kg)     Physical Exam  Constitutional: She is oriented to person, place, and time. She appears well-developed and well-nourished. No distress.  HENT:  Head: Normocephalic and atraumatic.  Eyes: Conjunctivae are normal. Pupils are equal, round, and reactive to light.  Neck: Normal range of motion. Neck supple. No thyromegaly present.  Cardiovascular: Normal rate, regular rhythm and normal heart sounds.   No murmur heard. Pulmonary/Chest: Effort normal and breath sounds normal. No respiratory distress. She has no wheezes. She has no rales.  Abdominal: Soft. Bowel sounds are normal. She exhibits no distension. There is no tenderness.  Musculoskeletal: Normal range of motion.  Lymphadenopathy:    She has no cervical adenopathy.  Neurological: She is alert and oriented to person, place, and time.  Skin: Skin is warm and dry.  Psychiatric: She has a normal mood and affect. Her behavior is normal. Judgment and thought content normal.     Lab Results  Component Value Date   WBC 5.8 11/03/2015   HCT 28.5 (L) 11/03/2015   PLT 239 11/03/2015   GLUCOSE 83 10/14/2015   ALT 22 10/14/2015   AST 45 (H) 10/14/2015   NA 136 10/14/2015   K 5.3 (H) 10/14/2015   CL 98 10/14/2015   CREATININE 1.53 (H) 10/14/2015   BUN 34 (H) 10/14/2015   CO2 25 10/14/2015    Patient was never admitted.  Assessment & Plan:   Sheena Morrow was seen today for anemia.  Diagnoses and all orders for this visit:  Iron deficiency anemia, unspecified  iron deficiency anemia type -     Fingerstick Hemoglobin  Paroxysmal atrial fibrillation (HCC)  Left homonymous hemianopsia  Other orders -     Ferrous Fumarate (HEMOCYTE - 106 MG FE) 324 (106 Fe) MG TABS tablet; Take 1 tablet (106 mg of iron total) by mouth 2 (two) times daily. For iron deficiency anemia    MRI shows evidence for stroke in both the right and left occipital lobes. Tamala Julian well be the reason for her hemianopsia. Due to the increased risk of stroke from Evista, we will go ahead and discontinue that drug today. Additionally just because of the concern about hemoglobin having dropped. Will discontinue the Plavix. She should continue her Xarelto though. I'm can  have her start on ferrous fumarate for iron supplement.  I have discontinued Ms. Leser's clopidogrel. I am also having her start on Ferrous Fumarate. Additionally, I am having her maintain her LINZESS, mometasone, meloxicam, atorvastatin, gabapentin, lisinopril, Rivaroxaban, metoprolol succinate, nitroGLYCERIN, omeprazole, montelukast, polyethylene glycol powder, methadone, methadone, methadone, raloxifene, triamcinolone cream, and minocycline.  Meds ordered this encounter  Medications  . Ferrous Fumarate (HEMOCYTE - 106 MG FE) 324 (106 Fe) MG TABS tablet    Sig: Take 1 tablet (106 mg of iron total) by mouth 2 (two) times daily. For iron deficiency anemia    Dispense:  60 tablet    Refill:  5   Hemoglobin today improved at 11.7.  Follow-up: Return in about 6 weeks (around 02/01/2016).  Claretta Fraise, M.D.

## 2015-12-27 DIAGNOSIS — L120 Bullous pemphigoid: Secondary | ICD-10-CM | POA: Diagnosis not present

## 2015-12-27 DIAGNOSIS — I2510 Atherosclerotic heart disease of native coronary artery without angina pectoris: Secondary | ICD-10-CM | POA: Diagnosis not present

## 2016-01-13 ENCOUNTER — Ambulatory Visit (INDEPENDENT_AMBULATORY_CARE_PROVIDER_SITE_OTHER): Payer: Medicare Other | Admitting: Family Medicine

## 2016-01-13 DIAGNOSIS — B0229 Other postherpetic nervous system involvement: Secondary | ICD-10-CM

## 2016-01-13 DIAGNOSIS — I2510 Atherosclerotic heart disease of native coronary artery without angina pectoris: Secondary | ICD-10-CM

## 2016-01-13 MED ORDER — METHADONE HCL 10 MG PO TABS: 10.0000 mg | ORAL_TABLET | Freq: Three times a day (TID) | ORAL | 0 refills | Status: DC

## 2016-01-13 NOTE — Progress Notes (Signed)
Subjective:  Patient ID: Sheena Morrow, female    DOB: Feb 22, 1933  Age: 82 y.o. MRN: [id number]  CC: Medication Refill (pt here today for her pain med refill, c/o some increased back pain)   HPI Sheena Morrow presents for Routine recheck. No injury but says that her back pain is increased since she had to stop the meloxicam. She says she can't stand it the way it is. She points to the right mid back region. No known injury. She's not had a fall. No pain diaries returned. Other than this new different pain her postherpetic neuralgia pain is stable.   History [name] has a past medical history of Clotting disorder ([place]); Constipation due to opioid therapy; Old MI (myocardial infarction) (07/21/2015); Osteoporosis; Postherpetic neuralgia; and Systolic CHF with reduced left ventricular function, NYHA class 2 ([place]) (05/21/2015).   She has no past surgical history on file.   Her family history is not on file.She reports that she quit smoking about 7 years ago. Her smoking use included Cigarettes. She started smoking about 46 years ago. She smoked 0.50 packs per day. She has never used smokeless tobacco. She reports that she does not drink alcohol or use drugs.    ROS Review of Systems  Constitutional: Negative for activity change, appetite change and fever.  HENT: Negative for congestion, rhinorrhea and sore throat.   Eyes: Negative for visual disturbance.  Respiratory: Negative for cough and shortness of breath.   Cardiovascular: Negative for chest pain and palpitations.  Gastrointestinal: Negative for abdominal pain, diarrhea and nausea.  Genitourinary: Negative for dysuria.  Musculoskeletal: Negative for arthralgias and myalgias.    Objective:  BP (!) 94/58   Pulse 80   Temp 97.8 F (36.6 C) (Oral)   Ht 5\' 1"  (1.549 m)   Wt 142 lb (64.4 kg)   BMI 26.83 kg/m   BP Readings from Last 3 Encounters:  01/13/16 (!) 94/58  12/21/15 103/65  11/03/15 101/65    Wt Readings from Last 3  Encounters:  01/13/16 142 lb (64.4 kg)  12/21/15 145 lb (65.8 kg)  11/03/15 150 lb 6 oz (68.2 kg)     Physical Exam  Constitutional: She is oriented to person, place, and time. She appears well-developed and well-nourished. No distress.  HENT:  Head: Normocephalic and atraumatic.  Eyes: Conjunctivae and EOM are normal. Pupils are equal, round, and reactive to light.  Neck: Normal range of motion. Neck supple. No thyromegaly present.  Cardiovascular: Normal rate, regular rhythm and normal heart sounds.   No murmur heard. Pulmonary/Chest: Effort normal and breath sounds normal. No respiratory distress. She has no wheezes. She has no rales.  Abdominal: Soft. Bowel sounds are normal. She exhibits no distension. There is no tenderness.  Musculoskeletal: She exhibits tenderness. She exhibits no edema.       Lumbar back: She exhibits decreased range of motion, tenderness and spasm. She exhibits no deformity and normal pulse.  Lymphadenopathy:    She has no cervical adenopathy.  Neurological: She is alert and oriented to person, place, and time. She has normal reflexes.  Skin: Skin is warm and dry.  Psychiatric: She has a normal mood and affect. Her behavior is normal. Judgment and thought content normal.     Lab Results  Component Value Date   WBC 5.8 11/03/2015   HCT 28.5 (L) 11/03/2015   PLT 239 11/03/2015   GLUCOSE 83 10/14/2015   ALT 22 10/14/2015   AST 45 (H) 10/14/2015   NA 136 10/14/2015  K 5.3 (H) 10/14/2015   CL 98 10/14/2015   CREATININE 1.53 (H) 10/14/2015   BUN 34 (H) 10/14/2015   CO2 25 10/14/2015    Patient was never admitted.  Assessment & Plan:   [place] was seen today for medication refill.  Diagnoses and all orders for this visit:  Postherpetic neuralgia -     methadone (DOLOPHINE) 10 MG tablet; Take 1 tablet (10 mg total) by mouth 3 (three) times daily. -     methadone (DOLOPHINE) 10 MG tablet; Take 1 tablet (10 mg total) by mouth 3 (three) times  daily. -     methadone (DOLOPHINE) 10 MG tablet; Take 1 tablet (10 mg total) by mouth 3 (three) times daily.   \  I am having Ms. Pitcock maintain her LINZESS, mometasone, meloxicam, atorvastatin, gabapentin, lisinopril, Rivaroxaban, metoprolol succinate, nitroGLYCERIN, omeprazole, montelukast, polyethylene glycol powder, raloxifene, triamcinolone cream, minocycline, Ferrous Fumarate, methadone, methadone, and methadone.  Meds ordered this encounter  Medications  . methadone (DOLOPHINE) 10 MG tablet    Sig: Take 1 tablet (10 mg total) by mouth 3 (three) times daily.    Dispense:  90 tablet    Refill:  0    Do not fill until Jan. 14, 2018  . methadone (DOLOPHINE) 10 MG tablet    Sig: Take 1 tablet (10 mg total) by mouth 3 (three) times daily.    Dispense:  90 tablet    Refill:  0  . methadone (DOLOPHINE) 10 MG tablet    Sig: Take 1 tablet (10 mg total) by mouth 3 (three) times daily.    Dispense:  90 tablet    Refill:  0    Do not fill until Feb. 13, 2018     Follow-up: Return in about 3 months (around 04/12/2016).  Claretta Fraise, M.D.

## 2016-01-31 DIAGNOSIS — H2513 Age-related nuclear cataract, bilateral: Secondary | ICD-10-CM | POA: Diagnosis not present

## 2016-01-31 DIAGNOSIS — H53462 Homonymous bilateral field defects, left side: Secondary | ICD-10-CM | POA: Diagnosis not present

## 2016-02-06 DIAGNOSIS — E785 Hyperlipidemia, unspecified: Secondary | ICD-10-CM | POA: Diagnosis not present

## 2016-02-06 DIAGNOSIS — Z87891 Personal history of nicotine dependence: Secondary | ICD-10-CM | POA: Diagnosis not present

## 2016-02-06 DIAGNOSIS — Z79899 Other long term (current) drug therapy: Secondary | ICD-10-CM | POA: Diagnosis not present

## 2016-02-06 DIAGNOSIS — I69398 Other sequelae of cerebral infarction: Secondary | ICD-10-CM | POA: Diagnosis not present

## 2016-02-06 DIAGNOSIS — H5347 Heteronymous bilateral field defects: Secondary | ICD-10-CM | POA: Diagnosis not present

## 2016-02-06 DIAGNOSIS — I509 Heart failure, unspecified: Secondary | ICD-10-CM | POA: Diagnosis not present

## 2016-02-06 DIAGNOSIS — I5089 Other heart failure: Secondary | ICD-10-CM | POA: Diagnosis not present

## 2016-02-06 DIAGNOSIS — I482 Chronic atrial fibrillation: Secondary | ICD-10-CM | POA: Diagnosis not present

## 2016-02-06 DIAGNOSIS — E784 Other hyperlipidemia: Secondary | ICD-10-CM | POA: Diagnosis not present

## 2016-02-06 DIAGNOSIS — Z7901 Long term (current) use of anticoagulants: Secondary | ICD-10-CM | POA: Diagnosis not present

## 2016-02-06 DIAGNOSIS — H53462 Homonymous bilateral field defects, left side: Secondary | ICD-10-CM | POA: Diagnosis not present

## 2016-02-07 DIAGNOSIS — L120 Bullous pemphigoid: Secondary | ICD-10-CM | POA: Diagnosis not present

## 2016-02-07 DIAGNOSIS — L243 Irritant contact dermatitis due to cosmetics: Secondary | ICD-10-CM | POA: Diagnosis not present

## 2016-02-09 DIAGNOSIS — H2511 Age-related nuclear cataract, right eye: Secondary | ICD-10-CM | POA: Diagnosis not present

## 2016-02-12 ENCOUNTER — Other Ambulatory Visit: Payer: Self-pay | Admitting: Family Medicine

## 2016-02-18 ENCOUNTER — Other Ambulatory Visit: Payer: Self-pay | Admitting: Family Medicine

## 2016-02-27 ENCOUNTER — Other Ambulatory Visit: Payer: Self-pay | Admitting: Family Medicine

## 2016-03-01 DIAGNOSIS — H2512 Age-related nuclear cataract, left eye: Secondary | ICD-10-CM | POA: Diagnosis not present

## 2016-03-23 ENCOUNTER — Other Ambulatory Visit: Payer: Self-pay | Admitting: Family Medicine

## 2016-04-11 DIAGNOSIS — I2119 ST elevation (STEMI) myocardial infarction involving other coronary artery of inferior wall: Secondary | ICD-10-CM | POA: Diagnosis not present

## 2016-04-11 DIAGNOSIS — L120 Bullous pemphigoid: Secondary | ICD-10-CM | POA: Diagnosis not present

## 2016-04-13 ENCOUNTER — Ambulatory Visit (INDEPENDENT_AMBULATORY_CARE_PROVIDER_SITE_OTHER): Payer: Medicare Other | Admitting: Family Medicine

## 2016-04-13 ENCOUNTER — Encounter: Payer: Self-pay | Admitting: Family Medicine

## 2016-04-13 ENCOUNTER — Ambulatory Visit (INDEPENDENT_AMBULATORY_CARE_PROVIDER_SITE_OTHER): Payer: Medicare Other

## 2016-04-13 VITALS — BP 78/46 | HR 77 | Temp 97.6°F | Ht 61.0 in | Wt 141.0 lb

## 2016-04-13 DIAGNOSIS — R062 Wheezing: Secondary | ICD-10-CM

## 2016-04-13 DIAGNOSIS — R059 Cough, unspecified: Secondary | ICD-10-CM

## 2016-04-13 DIAGNOSIS — R05 Cough: Secondary | ICD-10-CM

## 2016-04-13 DIAGNOSIS — B0229 Other postherpetic nervous system involvement: Secondary | ICD-10-CM

## 2016-04-13 MED ORDER — BENZONATATE 200 MG PO CAPS: 200.0000 mg | ORAL_CAPSULE | Freq: Three times a day (TID) | ORAL | 0 refills | Status: DC | PRN

## 2016-04-13 MED ORDER — AMOXICILLIN-POT CLAVULANATE 875-125 MG PO TABS: 1.0000 | ORAL_TABLET | Freq: Two times a day (BID) | ORAL | 0 refills | Status: DC

## 2016-04-13 MED ORDER — METHADONE HCL 10 MG PO TABS: 10.0000 mg | ORAL_TABLET | Freq: Three times a day (TID) | ORAL | 0 refills | Status: DC

## 2016-04-13 NOTE — Progress Notes (Signed)
Subjective:  Patient ID: Sheena Morrow, female    DOB: 03/16/1933  Age: 83 y.o. MRN: [id number]  CC: Medication Refill (pt here today for follow up on her chronic pain and to have her pain medications refilled. She is also c/o a cough that keeps her up at night.)   HPI Sheena Morrow presents for Routine recheck. No injury but says that her back pain is increased since she had to stop the meloxicam. She does get some relief when she takes a half of a meloxicam. She says she can't stand it the way it is. She points to the right mid back region. No known injury. She's not had a fall. No pain diaries returned.  Pain assessment: Cause of pain- Postherpetic neuralgia Pain location- mid back Pain on scale of 1-10- 8-9/10 between medications. As low as 2-3/10 with each dose. Frequency-constant What increases pain-house work bending movement What makes pain Better-medication Effects on ADL - diminished ability Any change in general medical condition-cough,  Current medications- methadone Effectiveness of current meds-adequate Adverse reactions form pain meds-constipation controlled with adjunct treatment Morphine equivalent 90  Pill count performed-Yes Urine drug screen- No Was the [place] reviewed- yes  If yes were their any concerning findings? - no   Patient has had a cough for about 2.5 weeks. She has some dyspnea on exertion particularly for doing light chores around the house. She's not had any fever. It has been dry, and no productivity. It has diminished her energy. It is keeping her awake at night. History [name] has a past medical history of Clotting disorder ([place]); Constipation due to opioid therapy; Old MI (myocardial infarction) (07/21/2015); Osteoporosis; Postherpetic neuralgia; and Systolic CHF with reduced left ventricular function, NYHA class 2 ([place]) (05/21/2015).   She has no past surgical history on file.   Her family history is not on file.She reports that she quit smoking  about 8 years ago. Her smoking use included Cigarettes. She started smoking about 47 years ago. She smoked 0.50 packs per day. She has never used smokeless tobacco. She reports that she does not drink alcohol or use drugs.    ROS Review of Systems  Constitutional: Negative for activity change, appetite change and fever.  HENT: Negative for congestion, rhinorrhea and sore throat.   Eyes: Negative for visual disturbance.  Respiratory: Positive for cough and shortness of breath (doe for chores at home. "I can't do anything.").   Cardiovascular: Negative for chest pain and palpitations.  Gastrointestinal: Negative for abdominal pain, diarrhea and nausea.  Genitourinary: Negative for dysuria.  Musculoskeletal: Negative for arthralgias and myalgias.    Objective:  BP (!) 78/46   Pulse 77   Temp 97.6 F (36.4 C) (Oral)   Ht 5\' 1"  (1.549 m)   Wt 141 lb (64 kg)   BMI 26.64 kg/m   BP Readings from Last 3 Encounters:  04/13/16 (!) 78/46  01/13/16 (!) 94/58  12/21/15 103/65    Wt Readings from Last 3 Encounters:  04/13/16 141 lb (64 kg)  01/13/16 142 lb (64.4 kg)  12/21/15 145 lb (65.8 kg)     Physical Exam  Constitutional: She is oriented to person, place, and time. She appears well-developed and well-nourished. No distress.  HENT:  Head: Normocephalic and atraumatic.  Eyes: Conjunctivae and EOM are normal. Pupils are equal, round, and reactive to light.  Neck: Normal range of motion. Neck supple. No thyromegaly present.  Cardiovascular: Normal rate, regular rhythm and normal heart sounds.   No murmur  heard. Pulmonary/Chest: Effort normal. No respiratory distress. She has wheezes (coarse, all fields). She has no rales.  Abdominal: Soft. Bowel sounds are normal. She exhibits no distension. There is no tenderness.  Musculoskeletal: She exhibits tenderness. She exhibits no edema.       Lumbar back: She exhibits decreased range of motion, tenderness and spasm. She exhibits no  deformity and normal pulse.  Lymphadenopathy:    She has no cervical adenopathy.  Neurological: She is alert and oriented to person, place, and time. She has normal reflexes.  Skin: Skin is warm and dry.  Psychiatric: She has a normal mood and affect. Her behavior is normal. Judgment and thought content normal.     Lab Results  Component Value Date   WBC 5.8 11/03/2015   HCT 28.5 (L) 11/03/2015   PLT 239 11/03/2015   GLUCOSE 83 10/14/2015   ALT 22 10/14/2015   AST 45 (H) 10/14/2015   NA 136 10/14/2015   K 5.3 (H) 10/14/2015   CL 98 10/14/2015   CREATININE 1.53 (H) 10/14/2015   BUN 34 (H) 10/14/2015   CO2 25 10/14/2015    Patient was never admitted.  Assessment & Plan:   [place] was seen today for medication refill.  Diagnoses and all orders for this visit:  Cough -     DG Chest 2 View; Future  Postherpetic neuralgia -     methadone (DOLOPHINE) 10 MG tablet; Take 1 tablet (10 mg total) by mouth 3 (three) times daily. -     methadone (DOLOPHINE) 10 MG tablet; Take 1 tablet (10 mg total) by mouth 3 (three) times daily. -     methadone (DOLOPHINE) 10 MG tablet; Take 1 tablet (10 mg total) by mouth 3 (three) times daily.  Wheezing -     DG Chest 2 View; Future  Other orders -     amoxicillin-clavulanate (AUGMENTIN) 875-125 MG tablet; Take 1 tablet by mouth 2 (two) times daily. Take all of this medication -     benzonatate (TESSALON) 200 MG capsule; Take 1 capsule (200 mg total) by mouth 3 (three) times daily as needed for cough.   \  I am having Ms. Doi start on amoxicillin-clavulanate and benzonatate. I am also having her maintain her mometasone, meloxicam, atorvastatin, lisinopril, Rivaroxaban, metoprolol succinate, nitroGLYCERIN, polyethylene glycol powder, raloxifene, triamcinolone cream, minocycline, Ferrous Fumarate, gabapentin, LINZESS, omeprazole, montelukast, methadone, methadone, and methadone.  Meds ordered this encounter  Medications  . methadone  (DOLOPHINE) 10 MG tablet    Sig: Take 1 tablet (10 mg total) by mouth 3 (three) times daily.    Dispense:  90 tablet    Refill:  0    Do not fill until Apr. 14, 2018  . methadone (DOLOPHINE) 10 MG tablet    Sig: Take 1 tablet (10 mg total) by mouth 3 (three) times daily.    Dispense:  90 tablet    Refill:  0  . methadone (DOLOPHINE) 10 MG tablet    Sig: Take 1 tablet (10 mg total) by mouth 3 (three) times daily.    Dispense:  90 tablet    Refill:  0    Do not fill until Jun 11, 2016  . amoxicillin-clavulanate (AUGMENTIN) 875-125 MG tablet    Sig: Take 1 tablet by mouth 2 (two) times daily. Take all of this medication    Dispense:  20 tablet    Refill:  0  . benzonatate (TESSALON) 200 MG capsule    Sig: Take 1 capsule (  200 mg total) by mouth 3 (three) times daily as needed for cough.    Dispense:  20 capsule    Refill:  0     Follow-up: Return in about 3 months (around 07/14/2016).  Claretta Fraise, M.D.

## 2016-05-01 DIAGNOSIS — H02413 Mechanical ptosis of bilateral eyelids: Secondary | ICD-10-CM | POA: Diagnosis not present

## 2016-05-01 DIAGNOSIS — L908 Other atrophic disorders of skin: Secondary | ICD-10-CM | POA: Diagnosis not present

## 2016-05-10 ENCOUNTER — Other Ambulatory Visit: Payer: Self-pay | Admitting: Family Medicine

## 2016-06-11 ENCOUNTER — Other Ambulatory Visit: Payer: Self-pay | Admitting: Family Medicine

## 2016-06-14 DIAGNOSIS — I2119 ST elevation (STEMI) myocardial infarction involving other coronary artery of inferior wall: Secondary | ICD-10-CM | POA: Diagnosis not present

## 2016-06-14 DIAGNOSIS — Z792 Long term (current) use of antibiotics: Secondary | ICD-10-CM | POA: Diagnosis not present

## 2016-06-14 DIAGNOSIS — L120 Bullous pemphigoid: Secondary | ICD-10-CM | POA: Diagnosis not present

## 2016-06-16 ENCOUNTER — Other Ambulatory Visit: Payer: Self-pay | Admitting: Family Medicine

## 2016-06-16 MED ORDER — SINGULAIR 10 MG PO TABS: 10.0000 mg | ORAL_TABLET | Freq: Every day | ORAL | 3 refills | Status: DC

## 2016-06-19 DIAGNOSIS — I482 Chronic atrial fibrillation: Secondary | ICD-10-CM | POA: Diagnosis not present

## 2016-06-19 DIAGNOSIS — I2119 ST elevation (STEMI) myocardial infarction involving other coronary artery of inferior wall: Secondary | ICD-10-CM | POA: Diagnosis not present

## 2016-06-19 DIAGNOSIS — I519 Heart disease, unspecified: Secondary | ICD-10-CM | POA: Diagnosis not present

## 2016-06-19 DIAGNOSIS — I5023 Acute on chronic systolic (congestive) heart failure: Secondary | ICD-10-CM | POA: Diagnosis not present

## 2016-06-21 ENCOUNTER — Encounter: Payer: Self-pay | Admitting: Family Medicine

## 2016-06-21 DIAGNOSIS — I517 Cardiomegaly: Secondary | ICD-10-CM | POA: Diagnosis not present

## 2016-06-21 DIAGNOSIS — I081 Rheumatic disorders of both mitral and tricuspid valves: Secondary | ICD-10-CM | POA: Diagnosis not present

## 2016-06-28 ENCOUNTER — Telehealth: Payer: Self-pay | Admitting: Family Medicine

## 2016-06-28 NOTE — Telephone Encounter (Signed)
What symptoms do you have? Runny nose and watery eyes  How long have you been sick? For about two months  Have you been seen for this problem? In the past she would take singulair wants to know if Dr. Livia Snellen will prescribe it again  If your provider decides to give you a prescription, which pharmacy would you like for it to be sent to? [facility] [place]   Patient informed that this information will be sent to the clinical staff for review and that they should receive a follow up call.

## 2016-06-29 ENCOUNTER — Other Ambulatory Visit: Payer: Self-pay | Admitting: *Deleted

## 2016-06-29 ENCOUNTER — Other Ambulatory Visit: Payer: Self-pay | Admitting: Family Medicine

## 2016-06-29 MED ORDER — SINGULAIR 10 MG PO TABS: 10.0000 mg | ORAL_TABLET | Freq: Every day | ORAL | 3 refills | Status: DC

## 2016-06-29 NOTE — Telephone Encounter (Signed)
I sent in the requested prescription 

## 2016-06-29 NOTE — Telephone Encounter (Signed)
Pt aware.

## 2016-07-12 ENCOUNTER — Encounter: Payer: Self-pay | Admitting: Family Medicine

## 2016-07-12 ENCOUNTER — Ambulatory Visit (INDEPENDENT_AMBULATORY_CARE_PROVIDER_SITE_OTHER): Payer: Medicare Other | Admitting: Family Medicine

## 2016-07-12 VITALS — BP 92/59 | HR 74 | Temp 97.7°F | Ht 61.0 in | Wt 138.0 lb

## 2016-07-12 DIAGNOSIS — I2510 Atherosclerotic heart disease of native coronary artery without angina pectoris: Secondary | ICD-10-CM | POA: Diagnosis not present

## 2016-07-12 DIAGNOSIS — I480 Paroxysmal atrial fibrillation: Secondary | ICD-10-CM | POA: Diagnosis not present

## 2016-07-12 DIAGNOSIS — M810 Age-related osteoporosis without current pathological fracture: Secondary | ICD-10-CM | POA: Diagnosis not present

## 2016-07-12 DIAGNOSIS — B0229 Other postherpetic nervous system involvement: Secondary | ICD-10-CM

## 2016-07-12 DIAGNOSIS — F1120 Opioid dependence, uncomplicated: Secondary | ICD-10-CM

## 2016-07-12 MED ORDER — OMEPRAZOLE 20 MG PO CPDR: 20.0000 mg | DELAYED_RELEASE_CAPSULE | Freq: Every day | ORAL | 1 refills | Status: DC

## 2016-07-12 MED ORDER — LISINOPRIL 5 MG PO TABS: 5.0000 mg | ORAL_TABLET | Freq: Every day | ORAL | 3 refills | Status: DC

## 2016-07-12 MED ORDER — METHADONE HCL 10 MG PO TABS: 10.0000 mg | ORAL_TABLET | Freq: Three times a day (TID) | ORAL | 0 refills | Status: DC

## 2016-07-12 MED ORDER — METOPROLOL SUCCINATE ER 25 MG PO TB24: 25.0000 mg | ORAL_TABLET | Freq: Every day | ORAL | 11 refills | Status: DC

## 2016-07-12 MED ORDER — ATORVASTATIN CALCIUM 20 MG PO TABS: 20.0000 mg | ORAL_TABLET | Freq: Every day | ORAL | 1 refills | Status: DC

## 2016-07-12 MED ORDER — RALOXIFENE HCL 60 MG PO TABS: 60.0000 mg | ORAL_TABLET | Freq: Every day | ORAL | 5 refills | Status: DC

## 2016-07-12 MED ORDER — GABAPENTIN 800 MG PO TABS: ORAL_TABLET | ORAL | 3 refills | Status: DC

## 2016-07-12 MED ORDER — LINACLOTIDE 145 MCG PO CAPS: ORAL_CAPSULE | ORAL | 5 refills | Status: DC

## 2016-07-12 NOTE — Progress Notes (Signed)
Subjective:  Patient ID: Sheena Morrow, female    DOB: 25-Dec-1933  Age: 83 y.o. MRN: [id number]  CC: Medication Refill (pt here today for routine follow up of her chronic medical conditions and has questions about stopping the Xarelto for a surgery)   HPI Sheena Morrow presents for Having some itching and discomfort in the left ear. Additionally some pain in the right leg. Pain assessment: Cause of pain-postherpetic myalgia  Pain location- mid back Pain on scale of 1-10- 2-3 as long as she is taking her medication regularly Frequency-constant What increases pain-nothing as long as she takes her medicine What makes pain Better-nothing except her medicine Effects on ADL - pain prevents it and medication allows Any change in general medical condition-none  Current medications- methadone 10 mg 3 times a day Effectiveness of current meds-very good Adverse reactions form pain meds-denied Morphine equivalent unknown  Pill count performed-No Urine drug screen- Yes Was the [place] reviewed- yes  If yes were their any concerning findings? - No   Patient in for follow-up of atrial fibrillation. Patient denies any recent bouts of chest pain or palpitations. Additionally, patient is taking anticoagulants. Patient denies any recent excessive bleeding episodes including epistaxis, bleeding from the gums, genitalia, rectal bleeding or hematuria. Additionally there has been no excessive bruising.  History Sheena Morrow has a past medical history of Clotting disorder ([place]); Constipation due to opioid therapy; Old MI (myocardial infarction) (07/21/2015); Osteoporosis; Postherpetic neuralgia; and Systolic CHF with reduced left ventricular function, NYHA class 2 ([place]) (05/21/2015).   She has no past surgical history on file.   Her family history is not on file.She reports that she quit smoking about 8 years ago. Her smoking use included Cigarettes. She started smoking about 47 years ago. She smoked 0.50 packs  per day. She has never used smokeless tobacco. She reports that she does not drink alcohol or use drugs.    ROS Review of Systems  Constitutional: Negative for activity change, appetite change and fever.  HENT: Negative for congestion, rhinorrhea and sore throat.   Eyes: Negative for visual disturbance.  Respiratory: Negative for cough and shortness of breath.   Cardiovascular: Negative for chest pain and palpitations.  Gastrointestinal: Negative for abdominal pain, diarrhea and nausea.  Genitourinary: Negative for dysuria.  Musculoskeletal: Positive for arthralgias and back pain. Negative for myalgias.    Objective:  BP (!) 92/59   Pulse 74   Temp 97.7 F (36.5 C) (Oral)   Ht '5\' 1"'  (1.549 m)   Wt 138 lb (62.6 kg)   BMI 26.07 kg/m   BP Readings from Last 3 Encounters:  07/12/16 (!) 92/59  04/13/16 (!) 78/46  01/13/16 (!) 94/58    Wt Readings from Last 3 Encounters:  07/12/16 138 lb (62.6 kg)  04/13/16 141 lb (64 kg)  01/13/16 142 lb (64.4 kg)     Physical Exam  Constitutional: She is oriented to person, place, and time. She appears well-developed and well-nourished. No distress.  HENT:  Head: Normocephalic and atraumatic.  Right Ear: External ear normal.  Left Ear: External ear normal.  Nose: Nose normal.  Mouth/Throat: Oropharynx is clear and moist.  Eyes: Conjunctivae and EOM are normal. Pupils are equal, round, and reactive to light.  Neck: Normal range of motion. Neck supple. No thyromegaly present.  Cardiovascular: Normal rate, regular rhythm and normal heart sounds.   No murmur heard. Pulmonary/Chest: Effort normal and breath sounds normal. No respiratory distress. She has no wheezes. She has no rales.  Abdominal: Soft. Bowel sounds are normal. She exhibits no distension. There is no tenderness.  Lymphadenopathy:    She has no cervical adenopathy.  Neurological: She is alert and oriented to person, place, and time. She has normal reflexes.  Skin: Skin is  warm and dry.  Psychiatric: She has a normal mood and affect. Her behavior is normal. Judgment and thought content normal.      Assessment & Plan:   Sheena Morrow was seen today for medication refill.  Diagnoses and all orders for this visit:  Paroxysmal atrial fibrillation (HCC) -     CBC with Differential/Platelet -     CMP14+EGFR  Postherpetic neuralgia -     methadone (DOLOPHINE) 10 MG tablet; Take 1 tablet (10 mg total) by mouth 3 (three) times daily. -     methadone (DOLOPHINE) 10 MG tablet; Take 1 tablet (10 mg total) by mouth 3 (three) times daily. -     methadone (DOLOPHINE) 10 MG tablet; Take 1 tablet (10 mg total) by mouth 3 (three) times daily. -     ToxASSURE Select 13 (MW), Urine -     CBC with Differential/Platelet -     CMP14+EGFR  Age related osteoporosis, unspecified pathological fracture presence -     raloxifene (EVISTA) 60 MG tablet; Take 1 tablet (60 mg total) by mouth daily. For bone health -     CBC with Differential/Platelet -     CMP14+EGFR  CAD in native artery -     CBC with Differential/Platelet -     CMP14+EGFR -     Lipid panel  Uncomplicated opioid dependence ([place]) -     ToxASSURE Select 13 (MW), Urine -     CBC with Differential/Platelet -     CMP14+EGFR  Other orders -     linaclotide (LINZESS) 145 MCG CAPS capsule; TAKE 1 CAPSULE TWICE DAILY FOR CONSTIPATION -     atorvastatin (LIPITOR) 20 MG tablet; Take 1 tablet (20 mg total) by mouth daily. -     gabapentin (NEURONTIN) 800 MG tablet; TAKE 1 TABLET EACH MORNING,TAKE 1 TABLET EACH AFTERNOON & 2 TABLETS EACH EVENING -     lisinopril (PRINIVIL,ZESTRIL) 5 MG tablet; Take 1 tablet (5 mg total) by mouth daily. -     metoprolol succinate (TOPROL-XL) 25 MG 24 hr tablet; Take 1 tablet (25 mg total) by mouth daily. -     omeprazole (PRILOSEC) 20 MG capsule; Take 1 capsule (20 mg total) by mouth daily.       I have discontinued Sheena Morrow's amoxicillin-clavulanate and benzonatate. I have changed  her LINZESS to linaclotide. I have also changed her atorvastatin, lisinopril, metoprolol succinate, and omeprazole. Additionally, I am having her maintain her mometasone, meloxicam, Rivaroxaban, nitroGLYCERIN, polyethylene glycol powder, triamcinolone cream, minocycline, Ferrous Fumarate, SINGULAIR, methadone, methadone, methadone, gabapentin, and raloxifene.  Allergies as of 07/12/2016   No Known Allergies     Medication List       Accurate as of 07/12/16 11:59 PM. Always use your most recent med list.          atorvastatin 20 MG tablet Commonly known as:  LIPITOR Take 1 tablet (20 mg total) by mouth daily.   Ferrous Fumarate 324 (106 Fe) MG Tabs tablet Commonly known as:  HEMOCYTE - 106 mg FE Take 1 tablet (106 mg of iron total) by mouth 2 (two) times daily. For iron deficiency anemia   gabapentin 800 MG tablet Commonly known as:  NEURONTIN TAKE 1 TABLET EACH MORNING,TAKE  1 TABLET EACH AFTERNOON & 2 TABLETS EACH EVENING   linaclotide 145 MCG Caps capsule Commonly known as:  LINZESS TAKE 1 CAPSULE TWICE DAILY FOR CONSTIPATION   lisinopril 5 MG tablet Commonly known as:  PRINIVIL,ZESTRIL Take 1 tablet (5 mg total) by mouth daily.   meloxicam 15 MG tablet Commonly known as:  MOBIC TAKE 1 TABLET BY MOUTH EVERY DAY WITH FOOD FOR BACK PAIN   methadone 10 MG tablet Commonly known as:  DOLOPHINE Take 1 tablet (10 mg total) by mouth 3 (three) times daily.   methadone 10 MG tablet Commonly known as:  DOLOPHINE Take 1 tablet (10 mg total) by mouth 3 (three) times daily.   methadone 10 MG tablet Commonly known as:  DOLOPHINE Take 1 tablet (10 mg total) by mouth 3 (three) times daily.   metoprolol succinate 25 MG 24 hr tablet Commonly known as:  TOPROL-XL Take 1 tablet (25 mg total) by mouth daily.   minocycline 100 MG capsule Commonly known as:  MINOCIN Take 1 capsule (100 mg total) by mouth 2 (two) times daily.   mometasone 0.1 % cream Commonly known as:  ELOCON To  affected areas   nitroGLYCERIN 0.4 MG SL tablet Commonly known as:  NITROSTAT Place under the tongue.   omeprazole 20 MG capsule Commonly known as:  PRILOSEC Take 1 capsule (20 mg total) by mouth daily.   polyethylene glycol powder powder Commonly known as:  GLYCOLAX/MIRALAX USE 1 DOSING CUP ORALLY IN 8 OZ. OF WATER DAILY   raloxifene 60 MG tablet Commonly known as:  EVISTA Take 1 tablet (60 mg total) by mouth daily. For bone health   Rivaroxaban 15 MG Tabs tablet Commonly known as:  XARELTO Take 15 mg by mouth.   SINGULAIR 10 MG tablet Generic drug:  montelukast Take 1 tablet (10 mg total) by mouth at bedtime.   triamcinolone cream 0.1 % Commonly known as:  KENALOG APPLY TOPICALLY 2 (TWO) TIMES DAILY. TO THE SCALP AS NEEDED        Follow-up: No Follow-up on file.  Claretta Fraise, M.D.

## 2016-07-13 ENCOUNTER — Telehealth: Payer: Self-pay | Admitting: Family Medicine

## 2016-07-13 ENCOUNTER — Other Ambulatory Visit: Payer: Self-pay | Admitting: Family Medicine

## 2016-07-13 DIAGNOSIS — R7989 Other specified abnormal findings of blood chemistry: Secondary | ICD-10-CM

## 2016-07-13 DIAGNOSIS — R945 Abnormal results of liver function studies: Principal | ICD-10-CM

## 2016-07-13 LAB — CBC WITH DIFFERENTIAL/PLATELET
BASOS: 1 %
Basophils Absolute: 0.1 10*3/uL (ref 0.0–0.2)
EOS (ABSOLUTE): 0.2 10*3/uL (ref 0.0–0.4)
EOS: 3 %
HEMATOCRIT: 33.1 % — AB (ref 34.0–46.6)
HEMOGLOBIN: 10.5 g/dL — AB (ref 11.1–15.9)
Immature Grans (Abs): 0 10*3/uL (ref 0.0–0.1)
Immature Granulocytes: 0 %
LYMPHS ABS: 2.6 10*3/uL (ref 0.7–3.1)
Lymphs: 40 %
MCH: 31.5 pg (ref 26.6–33.0)
MCHC: 31.7 g/dL (ref 31.5–35.7)
MCV: 99 fL — AB (ref 79–97)
MONOCYTES: 8 %
Monocytes Absolute: 0.5 10*3/uL (ref 0.1–0.9)
NEUTROS ABS: 3.1 10*3/uL (ref 1.4–7.0)
Neutrophils: 48 %
Platelets: 195 10*3/uL (ref 150–379)
RBC: 3.33 x10E6/uL — AB (ref 3.77–5.28)
RDW: 15.1 % (ref 12.3–15.4)
WBC: 6.4 10*3/uL (ref 3.4–10.8)

## 2016-07-13 LAB — CMP14+EGFR
A/G RATIO: 1.3 (ref 1.2–2.2)
ALBUMIN: 3.9 g/dL (ref 3.5–4.7)
ALK PHOS: 80 IU/L (ref 39–117)
ALT: 31 IU/L (ref 0–32)
AST: 104 IU/L — ABNORMAL HIGH (ref 0–40)
BUN / CREAT RATIO: 24 (ref 12–28)
BUN: 31 mg/dL — ABNORMAL HIGH (ref 8–27)
Bilirubin Total: 0.3 mg/dL (ref 0.0–1.2)
CO2: 23 mmol/L (ref 20–29)
CREATININE: 1.29 mg/dL — AB (ref 0.57–1.00)
Calcium: 9.4 mg/dL (ref 8.7–10.3)
Chloride: 103 mmol/L (ref 96–106)
GFR calc Af Amer: 45 mL/min/{1.73_m2} — ABNORMAL LOW (ref >59)
GFR calc non Af Amer: 39 mL/min/{1.73_m2} — ABNORMAL LOW (ref >59)
GLOBULIN, TOTAL: 3 g/dL (ref 1.5–4.5)
Glucose: 63 mg/dL — ABNORMAL LOW (ref 65–99)
POTASSIUM: 4.5 mmol/L (ref 3.5–5.2)
SODIUM: 140 mmol/L (ref 134–144)
Total Protein: 6.9 g/dL (ref 6.0–8.5)

## 2016-07-13 LAB — LIPID PANEL
CHOL/HDL RATIO: 2.4 ratio (ref 0.0–4.4)
CHOLESTEROL TOTAL: 120 mg/dL (ref 100–199)
HDL: 50 mg/dL (ref >39)
LDL CALC: 54 mg/dL (ref 0–99)
Triglycerides: 78 mg/dL (ref 0–149)
VLDL Cholesterol Cal: 16 mg/dL (ref 5–40)

## 2016-07-13 NOTE — Telephone Encounter (Signed)
Pt notified of results Verbalizes understanding Pt prefers to have [place] in [facility] area

## 2016-07-18 LAB — TOXASSURE SELECT 13 (MW), URINE

## 2016-07-19 ENCOUNTER — Other Ambulatory Visit: Payer: Self-pay | Admitting: Family Medicine

## 2016-07-24 ENCOUNTER — Encounter: Payer: Self-pay | Admitting: Family Medicine

## 2016-07-25 DIAGNOSIS — H02411 Mechanical ptosis of right eyelid: Secondary | ICD-10-CM | POA: Diagnosis not present

## 2016-07-25 DIAGNOSIS — H02412 Mechanical ptosis of left eyelid: Secondary | ICD-10-CM | POA: Diagnosis not present

## 2016-07-25 DIAGNOSIS — H02413 Mechanical ptosis of bilateral eyelids: Secondary | ICD-10-CM | POA: Diagnosis not present

## 2016-09-06 ENCOUNTER — Other Ambulatory Visit: Payer: Self-pay | Admitting: *Deleted

## 2016-09-06 MED ORDER — MONTELUKAST SODIUM 10 MG PO TABS: 10.0000 mg | ORAL_TABLET | Freq: Every day | ORAL | 2 refills | Status: DC

## 2016-09-20 DIAGNOSIS — L120 Bullous pemphigoid: Secondary | ICD-10-CM | POA: Diagnosis not present

## 2016-09-20 DIAGNOSIS — Z792 Long term (current) use of antibiotics: Secondary | ICD-10-CM | POA: Diagnosis not present

## 2016-09-20 DIAGNOSIS — I2119 ST elevation (STEMI) myocardial infarction involving other coronary artery of inferior wall: Secondary | ICD-10-CM | POA: Diagnosis not present

## 2016-09-22 IMAGING — DX DG CHEST 2V
2 series · 2 of 2 positions shown · non-contrast
Comparison: None.

CLINICAL DATA: Dyspnea.

EXAM:
CHEST  2 VIEW

[chest pa]
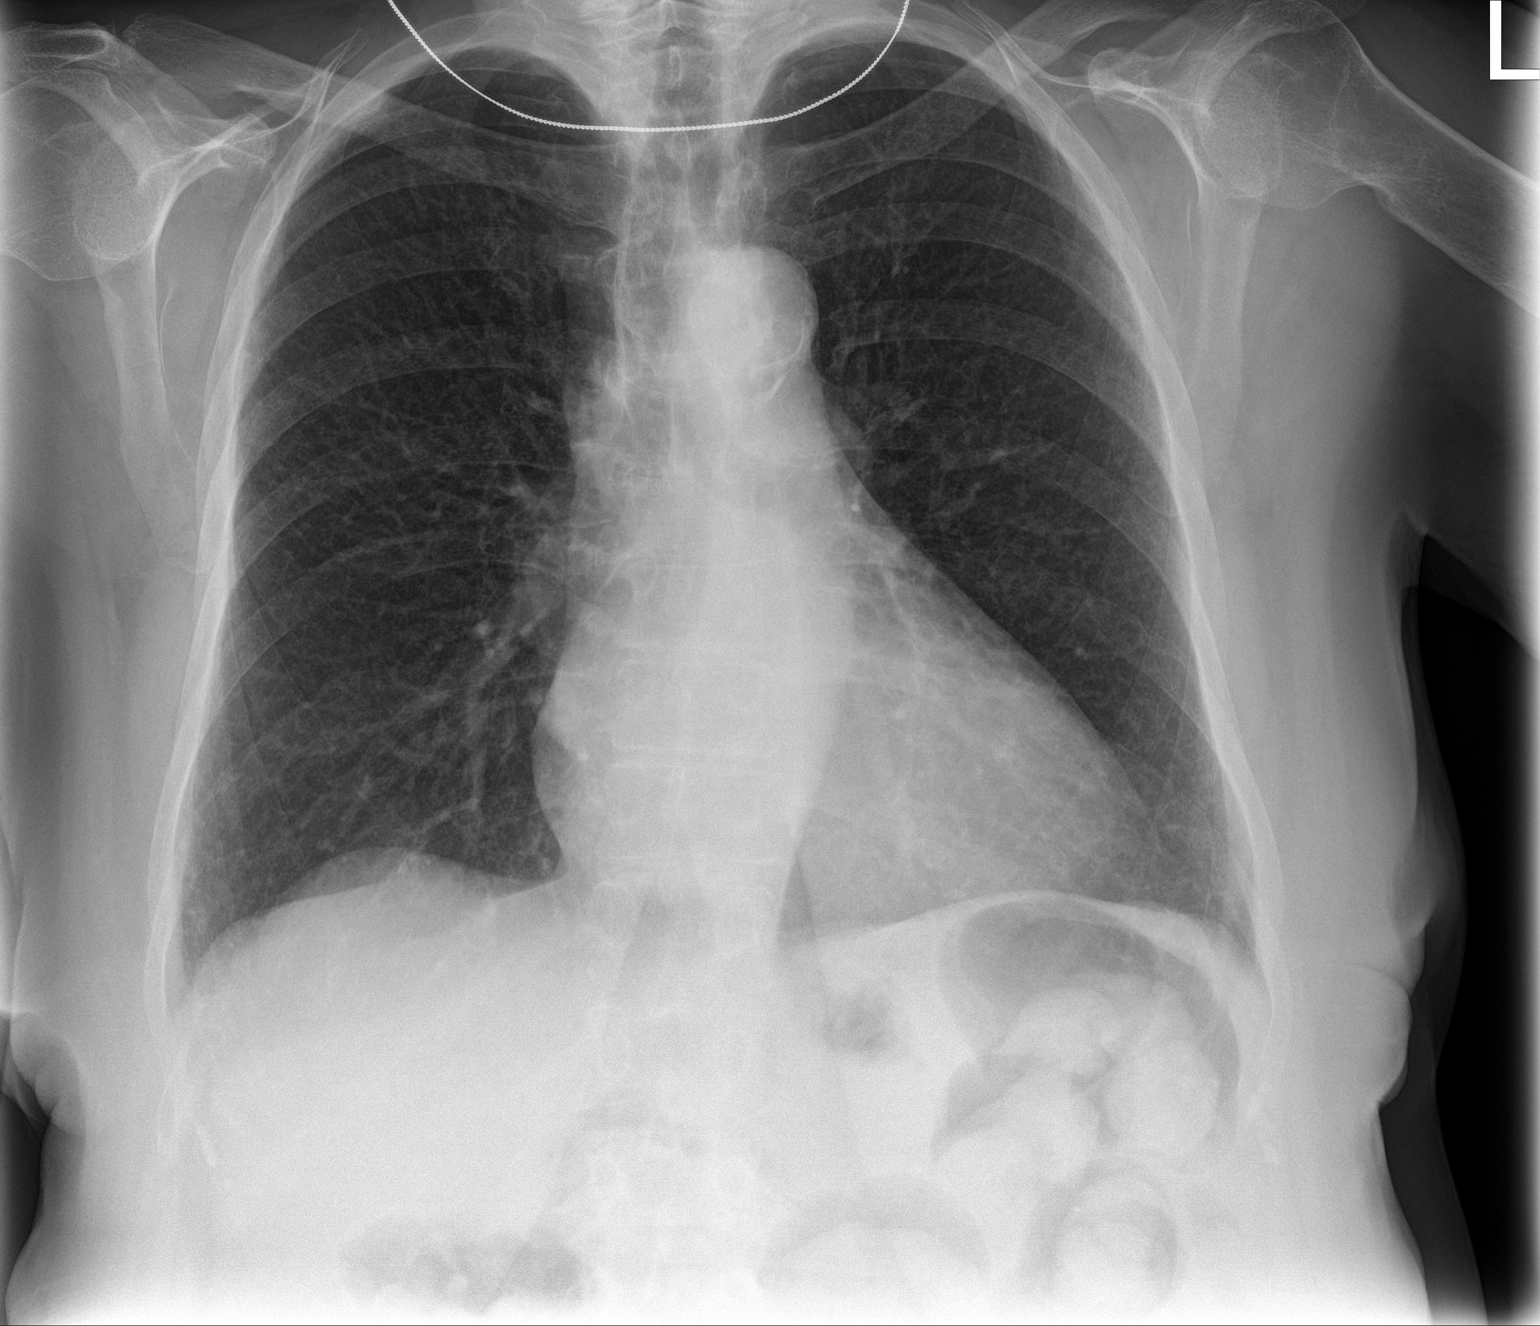

[chest lat]
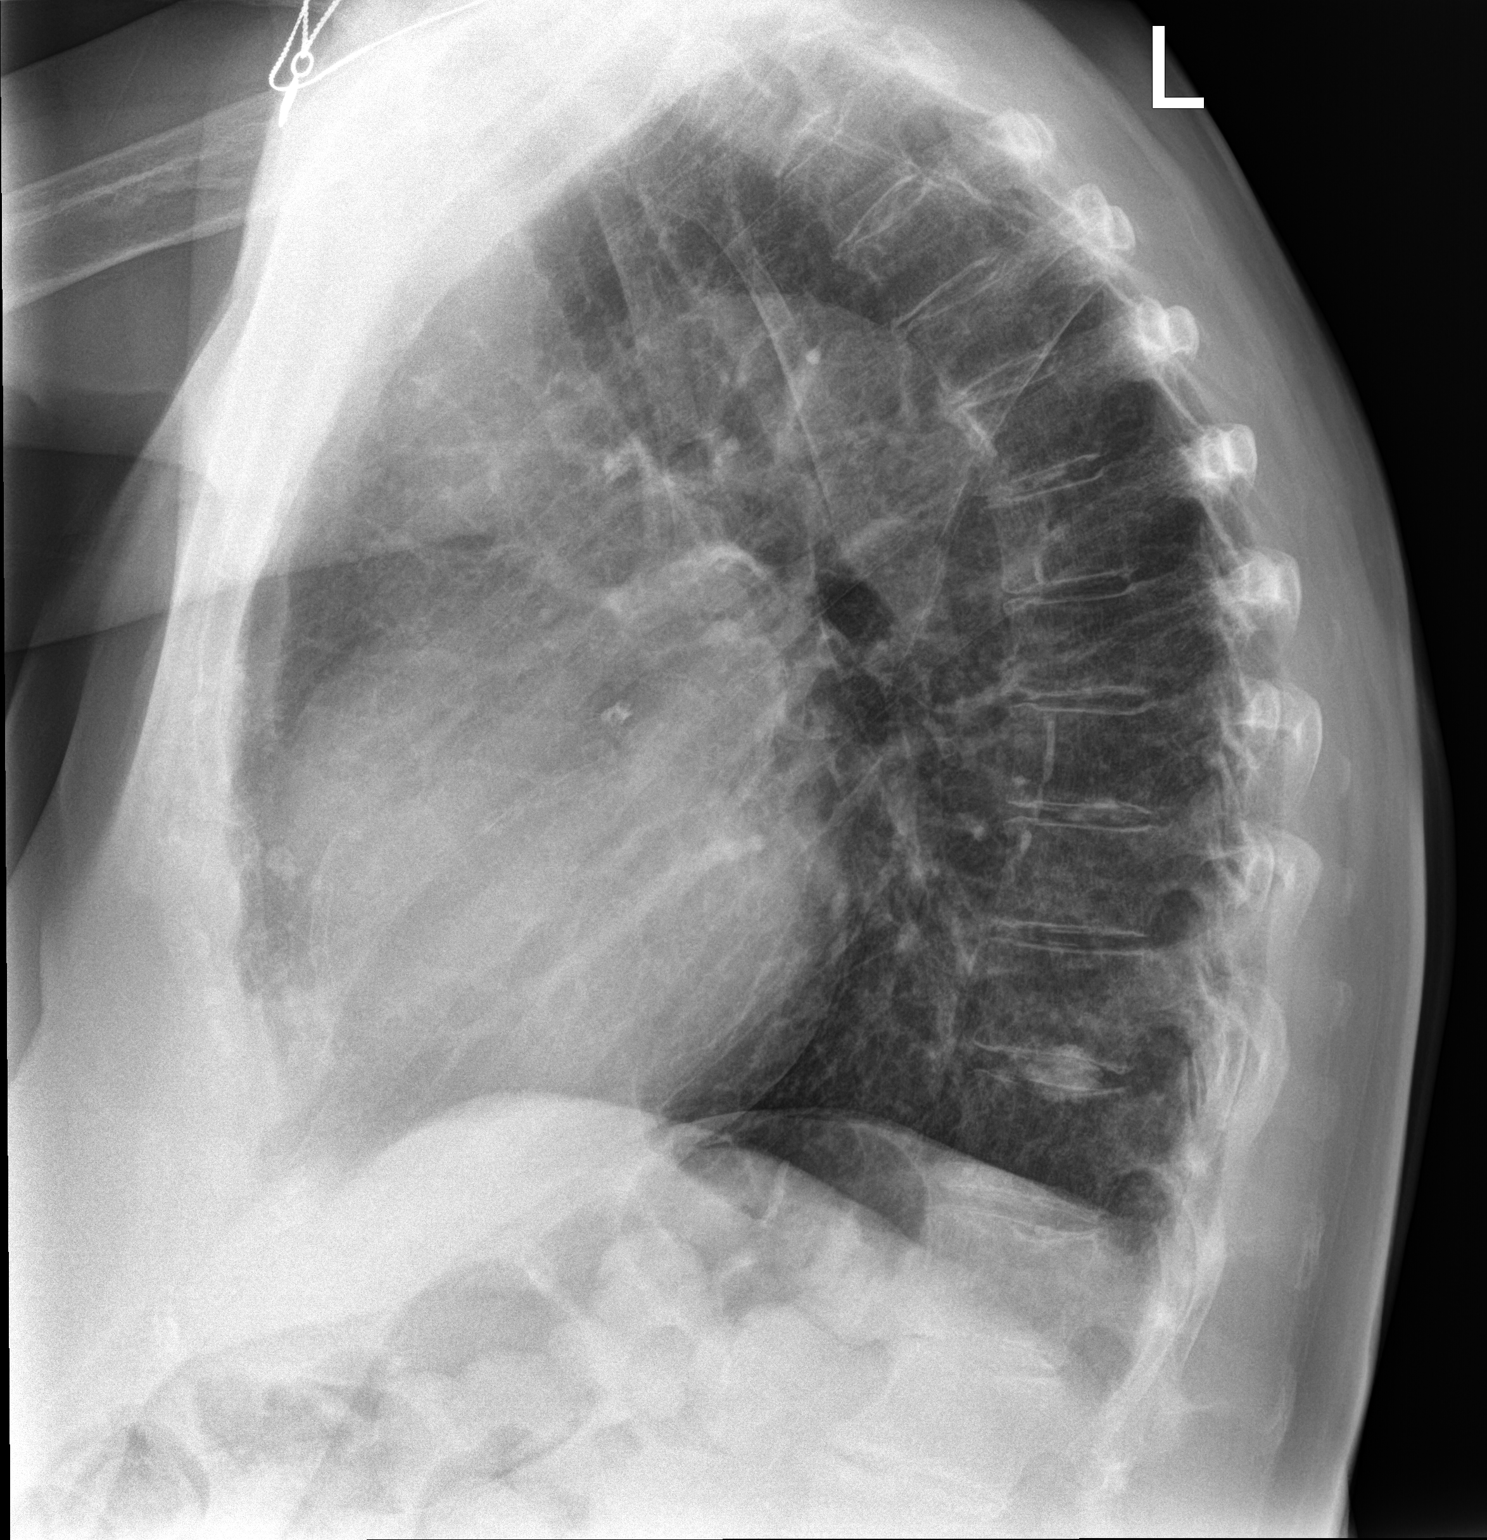

[2 of 2 positions shown; findings below may reference images not displayed]

FINDINGS: There is cardiomegaly without edema. Lungs are clear. No
pneumothorax or pleural effusion. Atherosclerosis noted.
IMPRESSION: Cardiomegaly without acute disease.

## 2016-10-15 ENCOUNTER — Encounter: Payer: Self-pay | Admitting: Family Medicine

## 2016-10-15 ENCOUNTER — Ambulatory Visit (INDEPENDENT_AMBULATORY_CARE_PROVIDER_SITE_OTHER): Payer: Medicare Other | Admitting: Family Medicine

## 2016-10-15 VITALS — BP 100/56 | HR 84 | Temp 96.9°F | Ht 61.0 in | Wt 135.0 lb

## 2016-10-15 DIAGNOSIS — B0229 Other postherpetic nervous system involvement: Secondary | ICD-10-CM | POA: Diagnosis not present

## 2016-10-15 DIAGNOSIS — I2510 Atherosclerotic heart disease of native coronary artery without angina pectoris: Secondary | ICD-10-CM

## 2016-10-15 DIAGNOSIS — Z23 Encounter for immunization: Secondary | ICD-10-CM | POA: Diagnosis not present

## 2016-10-15 DIAGNOSIS — R7989 Other specified abnormal findings of blood chemistry: Secondary | ICD-10-CM | POA: Diagnosis not present

## 2016-10-15 DIAGNOSIS — R945 Abnormal results of liver function studies: Principal | ICD-10-CM

## 2016-10-15 MED ORDER — METHADONE HCL 10 MG PO TABS: 10.0000 mg | ORAL_TABLET | Freq: Three times a day (TID) | ORAL | 0 refills | Status: DC

## 2016-10-15 NOTE — Progress Notes (Signed)
Subjective:  Patient ID: Sheena Morrow, female    DOB: Jun 07, 1933  Age: 83 y.o. MRN: [id number]  CC: Follow-up (3 mo)   HPI Sheena Morrow presents for Pain assessment: Cause of pain- Postherpetic neuralgia Pain location- mid back Pain on scale of 1-10- 2-3 (on med - never goes without it) Frequency- What increases pain-missing med What makes pain Better-meds Effects on ADL - none Any change in general medical condition-non  Current medications- methadone Effectiveness of current meds-good Adverse reactions form pain meds-none Morphine equivalent 240  Pill count performed-No Urine drug screen- No Was the [place] reviewed- yes  If yes were their any concerning findings? - 6 hydrocodone given by Ophthalmology after procedure. Pt. Says she didn't take them.  Now expresses concern about evista being unsafe for her heart. Wants to DC.   Patient in for follow-up of atrial fibrillation. Patient denies any recent bouts of chest pain or palpitations. Additionally, patient is taking anticoagulants. Patient denies any recent excessive bleeding episodes including epistaxis, bleeding from the gums, genitalia, rectal bleeding or hematuria. Additionally there has been no excessive bruising.  Patient was noted to have elevated liver enzymes at last office visit we'll repeat today   Depression screen [facility] 2/9 10/15/2016 07/12/2016 01/13/2016  Decreased Interest 0 0 0  Down, Depressed, Hopeless 0 0 0  PHQ - 2 Score 0 0 0    History Tyrika has a past medical history of Clotting disorder ([place]); Constipation due to opioid therapy; Old MI (myocardial infarction) (07/21/2015); Osteoporosis; Postherpetic neuralgia; and Systolic CHF with reduced left ventricular function, NYHA class 2 ([place]) (05/21/2015).   She has no past surgical history on file.   Her family history is not on file.She reports that she quit smoking about 8 years ago. Her smoking use included Cigarettes. She started smoking about 47  years ago. She smoked 0.50 packs per day. She has never used smokeless tobacco. She reports that she does not drink alcohol or use drugs.    ROS Review of Systems  Objective:  BP (!) 100/56 (BP Location: Right Arm)   Pulse 84   Temp (!) 96.9 F (36.1 C) (Oral)   Ht '5\' 1"'  (1.549 m)   Wt 135 lb (61.2 kg)   BMI 25.51 kg/m   BP Readings from Last 3 Encounters:  10/15/16 (!) 100/56  07/12/16 (!) 92/59  04/13/16 (!) 78/46    Wt Readings from Last 3 Encounters:  10/15/16 135 lb (61.2 kg)  07/12/16 138 lb (62.6 kg)  04/13/16 141 lb (64 kg)     Physical Exam    Assessment & Plan:   Sheena Morrow was seen today for follow-up.  Diagnoses and all orders for this visit:  Elevated LFTs -     CMP14+EGFR  Postherpetic neuralgia -     methadone (DOLOPHINE) 10 MG tablet; Take 1 tablet (10 mg total) by mouth 3 (three) times daily. -     methadone (DOLOPHINE) 10 MG tablet; Take 1 tablet (10 mg total) by mouth 3 (three) times daily. -     methadone (DOLOPHINE) 10 MG tablet; Take 1 tablet (10 mg total) by mouth 3 (three) times daily.  Need for influenza vaccination -     Flu vaccine HIGH DOSE PF (Fluzone High dose)       I have discontinued Ms. Grantham's raloxifene. I am also having her maintain her mometasone, meloxicam, Rivaroxaban, nitroGLYCERIN, triamcinolone cream, minocycline, Ferrous Fumarate, linaclotide, atorvastatin, gabapentin, lisinopril, metoprolol succinate, omeprazole, polyethylene glycol powder, montelukast, methadone, methadone,  and methadone.  Allergies as of 10/15/2016   No Known Allergies     Medication List       Accurate as of 10/15/16  3:40 PM. Always use your most recent med list.          atorvastatin 20 MG tablet Commonly known as:  LIPITOR Take 1 tablet (20 mg total) by mouth daily.   Ferrous Fumarate 324 (106 Fe) MG Tabs tablet Commonly known as:  HEMOCYTE - 106 mg FE Take 1 tablet (106 mg of iron total) by mouth 2 (two) times daily. For iron  deficiency anemia   gabapentin 800 MG tablet Commonly known as:  NEURONTIN TAKE 1 TABLET EACH MORNING,TAKE 1 TABLET EACH AFTERNOON & 2 TABLETS EACH EVENING   linaclotide 145 MCG Caps capsule Commonly known as:  LINZESS TAKE 1 CAPSULE TWICE DAILY FOR CONSTIPATION   lisinopril 5 MG tablet Commonly known as:  PRINIVIL,ZESTRIL Take 1 tablet (5 mg total) by mouth daily.   meloxicam 15 MG tablet Commonly known as:  MOBIC TAKE 1 TABLET BY MOUTH EVERY DAY WITH FOOD FOR BACK PAIN   methadone 10 MG tablet Commonly known as:  DOLOPHINE Take 1 tablet (10 mg total) by mouth 3 (three) times daily.   methadone 10 MG tablet Commonly known as:  DOLOPHINE Take 1 tablet (10 mg total) by mouth 3 (three) times daily.   methadone 10 MG tablet Commonly known as:  DOLOPHINE Take 1 tablet (10 mg total) by mouth 3 (three) times daily.   metoprolol succinate 25 MG 24 hr tablet Commonly known as:  TOPROL-XL Take 1 tablet (25 mg total) by mouth daily.   minocycline 100 MG capsule Commonly known as:  MINOCIN Take 1 capsule (100 mg total) by mouth 2 (two) times daily.   mometasone 0.1 % cream Commonly known as:  ELOCON To affected areas   montelukast 10 MG tablet Commonly known as:  SINGULAIR Take 1 tablet (10 mg total) by mouth at bedtime.   nitroGLYCERIN 0.4 MG SL tablet Commonly known as:  NITROSTAT Place under the tongue.   omeprazole 20 MG capsule Commonly known as:  PRILOSEC Take 1 capsule (20 mg total) by mouth daily.   polyethylene glycol powder powder Commonly known as:  GLYCOLAX/MIRALAX USE 1 DOSING CUP ORALLY IN 8 OZ. OF WATER DAILY   Rivaroxaban 15 MG Tabs tablet Commonly known as:  XARELTO Take 15 mg by mouth.   triamcinolone cream 0.1 % Commonly known as:  KENALOG APPLY TOPICALLY 2 (TWO) TIMES DAILY. TO THE SCALP AS NEEDED            Discharge Care Instructions        Start     Ordered   10/15/16 0000  CMP14+EGFR     10/15/16 1516   10/15/16 0000   methadone (DOLOPHINE) 10 MG tablet  3 times daily    Comments:  Do not fill until 11/11/16   10/15/16 1518   10/15/16 0000  methadone (DOLOPHINE) 10 MG tablet  3 times daily     10/15/16 1518   10/15/16 0000  methadone (DOLOPHINE) 10 MG tablet  3 times daily    Comments:  Do not fill until 12/12/16   10/15/16 1518   10/15/16 0000  Flu vaccine HIGH DOSE PF (Fluzone High dose)     10/15/16 1519       Follow-up: No Follow-up on file.  Claretta Fraise, M.D.

## 2016-10-16 LAB — CMP14+EGFR
A/G RATIO: 1.4 (ref 1.2–2.2)
ALBUMIN: 3.8 g/dL (ref 3.5–4.7)
ALK PHOS: 87 IU/L (ref 39–117)
ALT: 25 IU/L (ref 0–32)
AST: 84 IU/L — ABNORMAL HIGH (ref 0–40)
BILIRUBIN TOTAL: 0.2 mg/dL (ref 0.0–1.2)
BUN / CREAT RATIO: 21 (ref 12–28)
BUN: 31 mg/dL — ABNORMAL HIGH (ref 8–27)
CHLORIDE: 103 mmol/L (ref 96–106)
CO2: 23 mmol/L (ref 20–29)
Calcium: 8.9 mg/dL (ref 8.7–10.3)
Creatinine, Ser: 1.5 mg/dL — ABNORMAL HIGH (ref 0.57–1.00)
GFR calc non Af Amer: 32 mL/min/{1.73_m2} — ABNORMAL LOW (ref >59)
GFR, EST AFRICAN AMERICAN: 37 mL/min/{1.73_m2} — AB (ref >59)
Globulin, Total: 2.8 g/dL (ref 1.5–4.5)
Glucose: 65 mg/dL (ref 65–99)
POTASSIUM: 4.6 mmol/L (ref 3.5–5.2)
SODIUM: 139 mmol/L (ref 134–144)
TOTAL PROTEIN: 6.6 g/dL (ref 6.0–8.5)

## 2016-10-22 NOTE — Progress Notes (Signed)
Patient aware.

## 2016-10-30 ENCOUNTER — Other Ambulatory Visit: Payer: Self-pay | Admitting: Family Medicine

## 2016-11-02 DIAGNOSIS — R1032 Left lower quadrant pain: Secondary | ICD-10-CM | POA: Diagnosis not present

## 2016-11-02 DIAGNOSIS — N132 Hydronephrosis with renal and ureteral calculous obstruction: Secondary | ICD-10-CM | POA: Diagnosis not present

## 2016-11-02 DIAGNOSIS — K5900 Constipation, unspecified: Secondary | ICD-10-CM | POA: Diagnosis not present

## 2016-11-02 DIAGNOSIS — N281 Cyst of kidney, acquired: Secondary | ICD-10-CM | POA: Diagnosis not present

## 2016-11-02 DIAGNOSIS — N200 Calculus of kidney: Secondary | ICD-10-CM | POA: Diagnosis not present

## 2016-11-02 DIAGNOSIS — R935 Abnormal findings on diagnostic imaging of other abdominal regions, including retroperitoneum: Secondary | ICD-10-CM | POA: Diagnosis not present

## 2016-11-16 ENCOUNTER — Other Ambulatory Visit: Payer: Self-pay | Admitting: Family Medicine

## 2016-11-16 ENCOUNTER — Telehealth: Payer: Self-pay | Admitting: Family Medicine

## 2016-11-16 MED ORDER — RIVAROXABAN 15 MG PO TABS: 15.0000 mg | ORAL_TABLET | Freq: Two times a day (BID) | ORAL | 2 refills | Status: DC

## 2016-11-16 NOTE — Telephone Encounter (Signed)
15 mg p.o. BID

## 2016-11-16 NOTE — Telephone Encounter (Signed)
Patient aware that medication has been sent to pharmacy 

## 2016-11-16 NOTE — Telephone Encounter (Signed)
Okay to refill all meds for 6 mos 

## 2016-11-18 ENCOUNTER — Telehealth: Payer: Self-pay | Admitting: Pediatrics

## 2016-11-18 MED ORDER — RIVAROXABAN 15 MG PO TABS: 15.0000 mg | ORAL_TABLET | Freq: Every day | ORAL | 1 refills | Status: DC

## 2016-11-18 NOTE — Telephone Encounter (Signed)
Called confirmed with pt, she is taking xarelto 15mg  once a day, dose unchanged, as appropriate with GFR in 30s.

## 2016-11-22 DIAGNOSIS — R109 Unspecified abdominal pain: Secondary | ICD-10-CM | POA: Diagnosis not present

## 2016-11-22 DIAGNOSIS — N281 Cyst of kidney, acquired: Secondary | ICD-10-CM | POA: Diagnosis not present

## 2016-11-22 DIAGNOSIS — I2119 ST elevation (STEMI) myocardial infarction involving other coronary artery of inferior wall: Secondary | ICD-10-CM | POA: Diagnosis not present

## 2016-11-26 DIAGNOSIS — R109 Unspecified abdominal pain: Secondary | ICD-10-CM | POA: Diagnosis not present

## 2016-11-26 DIAGNOSIS — N281 Cyst of kidney, acquired: Secondary | ICD-10-CM | POA: Diagnosis not present

## 2016-11-26 DIAGNOSIS — N2889 Other specified disorders of kidney and ureter: Secondary | ICD-10-CM | POA: Diagnosis not present

## 2016-11-26 DIAGNOSIS — N202 Calculus of kidney with calculus of ureter: Secondary | ICD-10-CM | POA: Diagnosis not present

## 2016-11-26 DIAGNOSIS — K5900 Constipation, unspecified: Secondary | ICD-10-CM | POA: Diagnosis not present

## 2016-12-16 ENCOUNTER — Other Ambulatory Visit: Payer: Self-pay | Admitting: Family Medicine

## 2016-12-27 DIAGNOSIS — I2119 ST elevation (STEMI) myocardial infarction involving other coronary artery of inferior wall: Secondary | ICD-10-CM | POA: Diagnosis not present

## 2016-12-27 DIAGNOSIS — Z792 Long term (current) use of antibiotics: Secondary | ICD-10-CM | POA: Diagnosis not present

## 2016-12-27 DIAGNOSIS — L120 Bullous pemphigoid: Secondary | ICD-10-CM | POA: Diagnosis not present

## 2017-01-14 ENCOUNTER — Ambulatory Visit: Payer: Medicare Other | Admitting: Family Medicine

## 2017-01-14 DIAGNOSIS — I2510 Atherosclerotic heart disease of native coronary artery without angina pectoris: Secondary | ICD-10-CM | POA: Diagnosis not present

## 2017-01-15 ENCOUNTER — Ambulatory Visit (INDEPENDENT_AMBULATORY_CARE_PROVIDER_SITE_OTHER): Payer: Medicare Other | Admitting: Family Medicine

## 2017-01-15 ENCOUNTER — Ambulatory Visit: Payer: Medicare Other | Admitting: Family Medicine

## 2017-01-15 ENCOUNTER — Encounter: Payer: Self-pay | Admitting: Family Medicine

## 2017-01-15 VITALS — BP 113/63 | HR 59 | Temp 97.6°F | Ht 61.0 in

## 2017-01-15 DIAGNOSIS — I480 Paroxysmal atrial fibrillation: Secondary | ICD-10-CM

## 2017-01-15 DIAGNOSIS — Z23 Encounter for immunization: Secondary | ICD-10-CM | POA: Diagnosis not present

## 2017-01-15 DIAGNOSIS — F1120 Opioid dependence, uncomplicated: Secondary | ICD-10-CM | POA: Diagnosis not present

## 2017-01-15 DIAGNOSIS — I2510 Atherosclerotic heart disease of native coronary artery without angina pectoris: Secondary | ICD-10-CM | POA: Diagnosis not present

## 2017-01-15 DIAGNOSIS — B0229 Other postherpetic nervous system involvement: Secondary | ICD-10-CM

## 2017-01-15 MED ORDER — LINACLOTIDE 145 MCG PO CAPS: ORAL_CAPSULE | ORAL | 5 refills | Status: DC

## 2017-01-15 MED ORDER — FERROUS FUMARATE 324 (106 FE) MG PO TABS: 106.0000 mg | ORAL_TABLET | Freq: Two times a day (BID) | ORAL | 5 refills | Status: DC

## 2017-01-15 MED ORDER — OMEPRAZOLE 20 MG PO CPDR: 20.0000 mg | DELAYED_RELEASE_CAPSULE | Freq: Every day | ORAL | 1 refills | Status: DC

## 2017-01-15 MED ORDER — METHADONE HCL 10 MG PO TABS: 10.0000 mg | ORAL_TABLET | Freq: Three times a day (TID) | ORAL | 0 refills | Status: DC

## 2017-01-15 MED ORDER — LACTULOSE 10 GM/15ML PO SOLN
30.0000 g | Freq: Two times a day (BID) | ORAL | 5 refills | Status: DC
Start: 2017-01-15 — End: 2017-08-19

## 2017-01-15 MED ORDER — MOMETASONE FUROATE 50 MCG/ACT NA SUSP: NASAL | 12 refills | Status: DC

## 2017-01-15 MED ORDER — GABAPENTIN 800 MG PO TABS: ORAL_TABLET | ORAL | 3 refills | Status: DC

## 2017-01-15 MED ORDER — ATORVASTATIN CALCIUM 20 MG PO TABS: 20.0000 mg | ORAL_TABLET | Freq: Every day | ORAL | 1 refills | Status: DC

## 2017-01-15 MED ORDER — RIVAROXABAN 15 MG PO TABS: 15.0000 mg | ORAL_TABLET | Freq: Every day | ORAL | 1 refills | Status: DC

## 2017-01-15 NOTE — Progress Notes (Signed)
Subjective:  Patient ID: Sheena Morrow, female    DOB: 1933/05/02  Age: 83 y.o. MRN: [id number]  CC: Follow-up (pt here today for routine follow up of her chronic medical conditions)   HPI [name] presents for recheck for her chronic pain.  Patient has had over a decade of chronic pain which has been managed for many years with methadone.  Her pain continues to be 0-1/10 as long as she takes her methadone but quickly crescendos if she misses a dose.  She is very diligent with this medication.  She rarely if ever misses a dose and is always punctual with her appointments.  He has gone through a recent heart attack a year and a half ago.  Since that time she has developed problems with congestive heart failure and atrial fibrillation.  Currently she denies any active symptoms of either problem.  She does not have any palpitations or chest pain nor is she is suffering from shortness of breath or edema.  Energy level is rather poor but is at baseline.  She continues to be chronically anticoagulated with Xarelto.  She does not have any excessive bruising or bleeding.  She has a few senile purpura on the forearms that are of concern but are no worse than usual for her.  She has no melena.  Since her original trigger for her chronic pain was herpetic neuralgia she would like to have the shingles aches vaccination initiated today.  Depression screen [facility] 2/9 01/15/2017 10/15/2016 07/12/2016  Decreased Interest 0 0 0  Down, Depressed, Hopeless 0 0 0  PHQ - 2 Score 0 0 0    History [name] has a past medical history of Clotting disorder ([place]), Constipation due to opioid therapy, Old MI (myocardial infarction) (07/21/2015), Osteoporosis, Postherpetic neuralgia, and Systolic CHF with reduced left ventricular function, NYHA class 2 ([place]) (05/21/2015).   She has no past surgical history on file.   Her family history is not on file.She reports that she quit smoking about 8 years ago. Her smoking use included  cigarettes. She started smoking about 47 years ago. She smoked 0.50 packs per day. she has never used smokeless tobacco. She reports that she does not drink alcohol or use drugs.    ROS Review of Systems  Constitutional: Negative for activity change, appetite change and fever.  HENT: Negative for congestion, rhinorrhea and sore throat.   Eyes: Negative for visual disturbance.  Respiratory: Negative for cough and shortness of breath.   Cardiovascular: Negative for chest pain and palpitations.  Gastrointestinal: Negative for abdominal pain, diarrhea and nausea.  Genitourinary: Negative for dysuria.  Musculoskeletal: Negative for arthralgias and myalgias.    Objective:  BP 113/63   Pulse (!) 59   Temp 97.6 F (36.4 C) (Oral)   Ht '5\' 1"'  (1.549 m)   BMI 25.51 kg/m   BP Readings from Last 3 Encounters:  01/15/17 113/63  10/15/16 (!) 100/56  07/12/16 (!) 92/59    Wt Readings from Last 3 Encounters:  10/15/16 135 lb (61.2 kg)  07/12/16 138 lb (62.6 kg)  04/13/16 141 lb (64 kg)     Physical Exam  Constitutional: She is oriented to person, place, and time. She appears well-developed and well-nourished. No distress.  HENT:  Head: Normocephalic and atraumatic.  Right Ear: External ear normal.  Left Ear: External ear normal.  Nose: Nose normal.  Mouth/Throat: Oropharynx is clear and moist.  Eyes: Conjunctivae and EOM are normal. Pupils are equal, round, and reactive to  light.  Neck: Normal range of motion. Neck supple. No thyromegaly present.  Cardiovascular: Normal rate, regular rhythm and normal heart sounds.  No murmur heard. Pulmonary/Chest: Effort normal and breath sounds normal. No respiratory distress. She has no wheezes. She has no rales.  Abdominal: Soft. Bowel sounds are normal. She exhibits no distension. There is no tenderness.  Lymphadenopathy:    She has no cervical adenopathy.  Neurological: She is alert and oriented to person, place, and time. She has normal  reflexes.  Skin: Skin is warm and dry.  Psychiatric: She has a normal mood and affect. Her behavior is normal. Judgment and thought content normal.      Assessment & Plan:   Rainbow was seen today for follow-up.  Diagnoses and all orders for this visit:  Postherpetic neuralgia -     methadone (DOLOPHINE) 10 MG tablet; Take 1 tablet (10 mg total) by mouth 3 (three) times daily. -     methadone (DOLOPHINE) 10 MG tablet; Take 1 tablet (10 mg total) by mouth 3 (three) times daily. -     methadone (DOLOPHINE) 10 MG tablet; Take 1 tablet (10 mg total) by mouth 3 (three) times daily.  Paroxysmal atrial fibrillation (HCC) -     CMP14+EGFR  CAD in native artery -     CMP14+EGFR  Opioid dependence, uncomplicated (HCC) -     ToxASSURE Select 13 (MW), Urine  Need for shingles vaccine -     Varicella-zoster vaccine IM (Shingrix)  Other orders -     lactulose (CHRONULAC) 10 GM/15ML solution; Take 45 mLs (30 g total) by mouth 2 (two) times daily. -     atorvastatin (LIPITOR) 20 MG tablet; Take 1 tablet (20 mg total) by mouth daily. -     Ferrous Fumarate (HEMOCYTE - 106 MG FE) 324 (106 Fe) MG TABS tablet; Take 1 tablet (106 mg of iron total) by mouth 2 (two) times daily. For iron deficiency anemia -     gabapentin (NEURONTIN) 800 MG tablet; TAKE 1 TABLET EACH MORNING,TAKE 1 TABLET EACH AFTERNOON & 2 TABLETS EACH EVENING -     linaclotide (LINZESS) 145 MCG CAPS capsule; TAKE 1 CAPSULE TWICE DAILY FOR CONSTIPATION -     omeprazole (PRILOSEC) 20 MG capsule; Take 1 capsule (20 mg total) by mouth daily. -     Rivaroxaban (XARELTO) 15 MG TABS tablet; Take 1 tablet (15 mg total) by mouth daily with supper. -     mometasone (NASONEX) 50 MCG/ACT nasal spray; Place 2 sprays into each nostril daily.       I am having Sheena Morrow start on lactulose and mometasone. I am also having her maintain her mometasone, meloxicam, nitroGLYCERIN, triamcinolone cream, minocycline, lisinopril, metoprolol  succinate, montelukast, polyethylene glycol powder, methadone, methadone, methadone, atorvastatin, Ferrous Fumarate, gabapentin, linaclotide, omeprazole, and Rivaroxaban.  Allergies as of 01/15/2017   No Known Allergies     Medication List        Accurate as of 01/15/17  8:27 PM. Always use your most recent med list.          atorvastatin 20 MG tablet Commonly known as:  LIPITOR Take 1 tablet (20 mg total) by mouth daily.   Ferrous Fumarate 324 (106 Fe) MG Tabs tablet Commonly known as:  HEMOCYTE - 106 mg FE Take 1 tablet (106 mg of iron total) by mouth 2 (two) times daily. For iron deficiency anemia   gabapentin 800 MG tablet Commonly known as:  NEURONTIN TAKE 1 TABLET EACH  MORNING,TAKE 1 TABLET EACH AFTERNOON & 2 TABLETS EACH EVENING   lactulose 10 GM/15ML solution Commonly known as:  CHRONULAC Take 45 mLs (30 g total) by mouth 2 (two) times daily.   linaclotide 145 MCG Caps capsule Commonly known as:  LINZESS TAKE 1 CAPSULE TWICE DAILY FOR CONSTIPATION   lisinopril 5 MG tablet Commonly known as:  PRINIVIL,ZESTRIL Take 1 tablet (5 mg total) by mouth daily.   meloxicam 15 MG tablet Commonly known as:  MOBIC TAKE 1 TABLET BY MOUTH EVERY DAY WITH FOOD FOR BACK PAIN   methadone 10 MG tablet Commonly known as:  DOLOPHINE Take 1 tablet (10 mg total) by mouth 3 (three) times daily.   methadone 10 MG tablet Commonly known as:  DOLOPHINE Take 1 tablet (10 mg total) by mouth 3 (three) times daily.   methadone 10 MG tablet Commonly known as:  DOLOPHINE Take 1 tablet (10 mg total) by mouth 3 (three) times daily.   metoprolol succinate 25 MG 24 hr tablet Commonly known as:  TOPROL-XL Take 1 tablet (25 mg total) by mouth daily.   minocycline 100 MG capsule Commonly known as:  MINOCIN Take 1 capsule (100 mg total) by mouth 2 (two) times daily.   mometasone 0.1 % cream Commonly known as:  ELOCON To affected areas   mometasone 50 MCG/ACT nasal spray Commonly known  as:  NASONEX Place 2 sprays into each nostril daily.   montelukast 10 MG tablet Commonly known as:  SINGULAIR Take 1 tablet (10 mg total) by mouth at bedtime.   nitroGLYCERIN 0.4 MG SL tablet Commonly known as:  NITROSTAT Place under the tongue.   omeprazole 20 MG capsule Commonly known as:  PRILOSEC Take 1 capsule (20 mg total) by mouth daily.   polyethylene glycol powder powder Commonly known as:  GLYCOLAX/MIRALAX USE 1 DOSING CUP ORALLY IN 8 OZ. OF WATER DAILY   Rivaroxaban 15 MG Tabs tablet Commonly known as:  XARELTO Take 1 tablet (15 mg total) by mouth daily with supper.   triamcinolone cream 0.1 % Commonly known as:  KENALOG APPLY TOPICALLY 2 (TWO) TIMES DAILY. TO THE SCALP AS NEEDED        Follow-up: Return in about 3 months (around 04/15/2017).  Claretta Fraise, M.D.

## 2017-01-16 LAB — CMP14+EGFR
ALBUMIN: 3.7 g/dL (ref 3.5–4.7)
ALK PHOS: 95 IU/L (ref 39–117)
ALT: 26 IU/L (ref 0–32)
AST: 80 IU/L — AB (ref 0–40)
Albumin/Globulin Ratio: 1.2 (ref 1.2–2.2)
BUN / CREAT RATIO: 18 (ref 12–28)
BUN: 24 mg/dL (ref 8–27)
Bilirubin Total: 0.2 mg/dL (ref 0.0–1.2)
CHLORIDE: 103 mmol/L (ref 96–106)
CO2: 26 mmol/L (ref 20–29)
CREATININE: 1.31 mg/dL — AB (ref 0.57–1.00)
Calcium: 9.2 mg/dL (ref 8.7–10.3)
GFR calc Af Amer: 44 mL/min/{1.73_m2} — ABNORMAL LOW (ref >59)
GFR calc non Af Amer: 38 mL/min/{1.73_m2} — ABNORMAL LOW (ref >59)
GLUCOSE: 74 mg/dL (ref 65–99)
Globulin, Total: 3 g/dL (ref 1.5–4.5)
Potassium: 4.9 mmol/L (ref 3.5–5.2)
Sodium: 141 mmol/L (ref 134–144)
Total Protein: 6.7 g/dL (ref 6.0–8.5)

## 2017-01-19 LAB — TOXASSURE SELECT 13 (MW), URINE

## 2017-02-21 DIAGNOSIS — R109 Unspecified abdominal pain: Secondary | ICD-10-CM | POA: Diagnosis not present

## 2017-02-21 DIAGNOSIS — I5020 Unspecified systolic (congestive) heart failure: Secondary | ICD-10-CM | POA: Diagnosis not present

## 2017-02-21 DIAGNOSIS — N200 Calculus of kidney: Secondary | ICD-10-CM | POA: Diagnosis not present

## 2017-02-21 DIAGNOSIS — I5023 Acute on chronic systolic (congestive) heart failure: Secondary | ICD-10-CM | POA: Diagnosis not present

## 2017-02-21 DIAGNOSIS — I482 Chronic atrial fibrillation: Secondary | ICD-10-CM | POA: Diagnosis not present

## 2017-03-23 IMAGING — DX DG CHEST 2V
2 series · 2 of 2 positions shown · non-contrast
Comparison: 10/14/2015

CLINICAL DATA: Cough and congestion for 2 weeks

EXAM:
CHEST  2 VIEW

[chest pa]
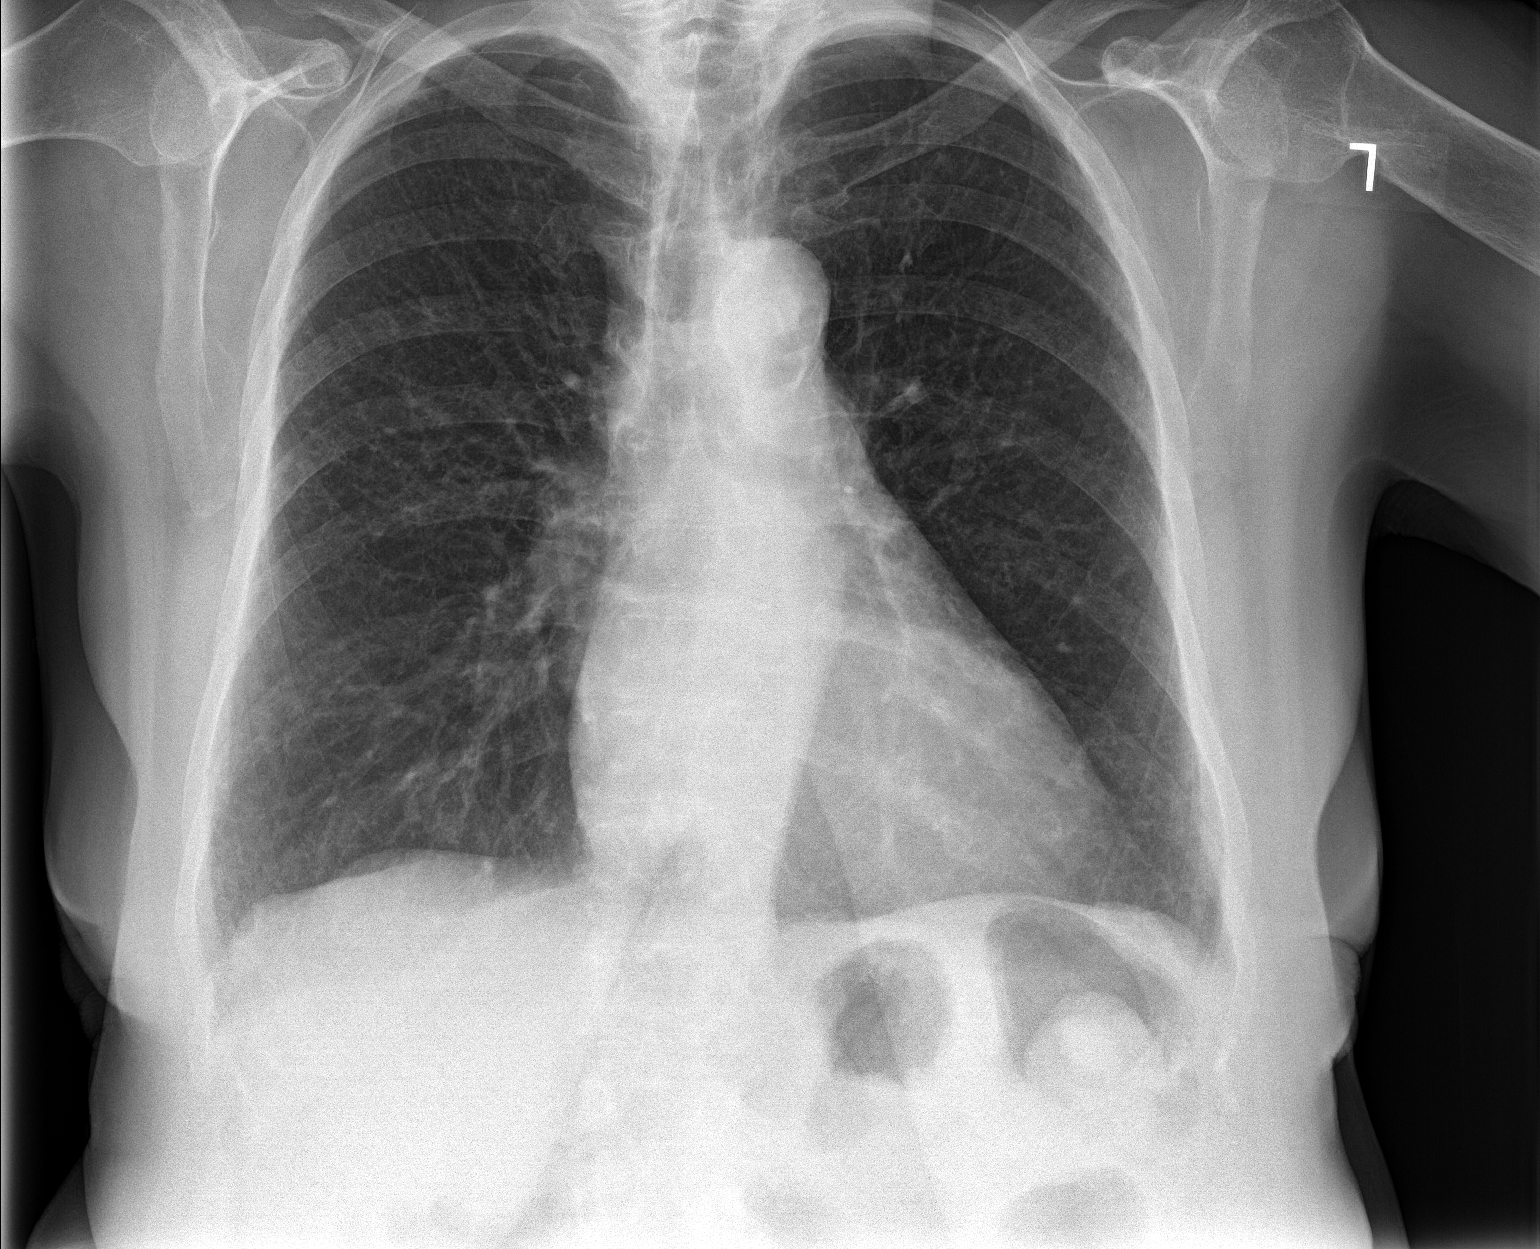

[chest lat]
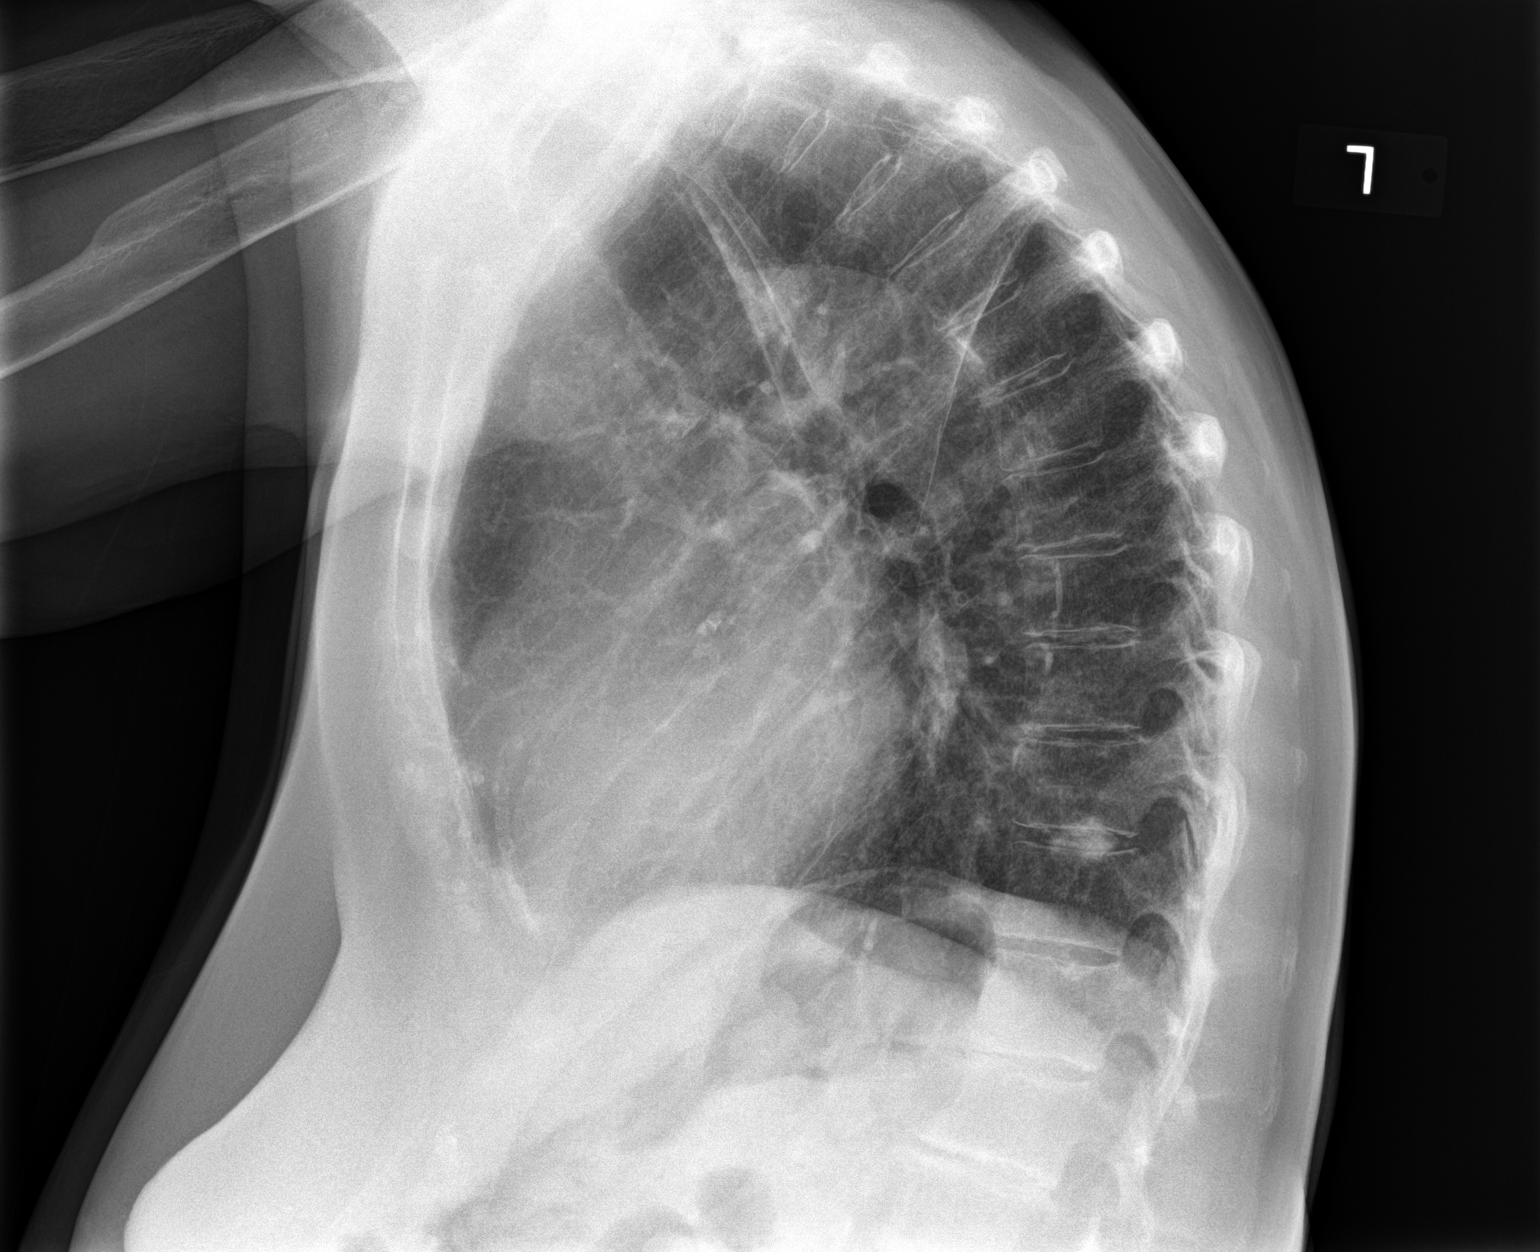

[2 of 2 positions shown; findings below may reference images not displayed]

FINDINGS: Mild hyperinflation. There is no focal pulmonary infiltrate,
consolidation, or pleural effusion. Stable borderline cardiomegaly
with atherosclerosis. No pneumothorax. An opacity overlying the
lower spine is unchanged and may represent a calcified nodule or be
related to degenerative disc changes.
IMPRESSION: No active cardiopulmonary disease.

## 2017-03-28 DIAGNOSIS — L658 Other specified nonscarring hair loss: Secondary | ICD-10-CM | POA: Diagnosis not present

## 2017-03-28 DIAGNOSIS — L120 Bullous pemphigoid: Secondary | ICD-10-CM | POA: Diagnosis not present

## 2017-04-10 ENCOUNTER — Ambulatory Visit: Payer: Medicare Other | Admitting: Family Medicine

## 2017-04-16 ENCOUNTER — Ambulatory Visit: Payer: Medicare Other | Admitting: Family Medicine

## 2017-04-26 ENCOUNTER — Encounter: Payer: Self-pay | Admitting: Family Medicine

## 2017-04-26 ENCOUNTER — Ambulatory Visit (INDEPENDENT_AMBULATORY_CARE_PROVIDER_SITE_OTHER): Payer: Medicare Other | Admitting: Family Medicine

## 2017-04-26 VITALS — BP 99/48 | HR 86 | Temp 97.2°F | Ht 61.0 in | Wt 134.5 lb

## 2017-04-26 DIAGNOSIS — F1120 Opioid dependence, uncomplicated: Secondary | ICD-10-CM | POA: Diagnosis not present

## 2017-04-26 DIAGNOSIS — I8390 Asymptomatic varicose veins of unspecified lower extremity: Secondary | ICD-10-CM

## 2017-04-26 DIAGNOSIS — B0229 Other postherpetic nervous system involvement: Secondary | ICD-10-CM | POA: Diagnosis not present

## 2017-05-02 LAB — TOXASSURE SELECT 13 (MW), URINE

## 2017-05-04 ENCOUNTER — Encounter: Payer: Self-pay | Admitting: Family Medicine

## 2017-05-04 MED ORDER — METHADONE HCL 10 MG PO TABS: 10.0000 mg | ORAL_TABLET | Freq: Three times a day (TID) | ORAL | 0 refills | Status: DC

## 2017-05-04 MED ORDER — MELOXICAM 15 MG PO TABS: ORAL_TABLET | ORAL | 0 refills | Status: DC

## 2017-05-04 MED ORDER — METHADONE HCL 10 MG PO TABS
10.0000 mg | ORAL_TABLET | Freq: Three times a day (TID) | ORAL | 0 refills | Status: DC
Start: 2017-05-04 — End: 2017-05-04

## 2017-05-04 NOTE — Progress Notes (Signed)
Subjective:  Patient ID: Sheena Morrow, female    DOB: 05/30/1933  Age: 84 y.o. MRN: [id number]  CC: Medication Refill (pt here today for refills on her medications)   HPI Sheena Morrow presents for follow up of pain. Source of pain is post herpetic neuralgia. It is located between the shoulder blades. It is well controlled by her methadone, but if she misses a dose her pain is excruciating. She has no other concerns today. Denies any chnages in her cardiac condition.  She continues to take anticoagulant for atrial fibrillation.  She denies any recent bouts of chest pain.  She continues to take metoprolol for angina prevention.  Depression screen [facility] 2/9 04/26/2017 01/15/2017 10/15/2016  Decreased Interest 0 0 0  Down, Depressed, Hopeless 0 0 0  PHQ - 2 Score 0 0 0    History Sheena Morrow has a past medical history of Clotting disorder ([place]), Constipation due to opioid therapy, Old MI (myocardial infarction) (07/21/2015), Osteoporosis, Postherpetic neuralgia, and Systolic CHF with reduced left ventricular function, NYHA class 2 ([place]) (05/21/2015).   She has no past surgical history on file.   Her family history is not on file.She reports that she quit smoking about 9 years ago. Her smoking use included cigarettes. She started smoking about 48 years ago. She smoked 0.50 packs per day. She has never used smokeless tobacco. She reports that she does not drink alcohol or use drugs.    ROS Review of Systems  Constitutional: Negative.   HENT: Negative for congestion.   Eyes: Negative for visual disturbance.  Respiratory: Negative for shortness of breath.   Cardiovascular: Negative for chest pain.  Gastrointestinal: Negative for abdominal pain, constipation, diarrhea, nausea and vomiting.  Genitourinary: Negative for difficulty urinating.  Musculoskeletal: Negative for arthralgias and myalgias.  Neurological: Negative for headaches.  Psychiatric/Behavioral: Negative for sleep disturbance.     Objective:  BP (!) 99/48   Pulse 86   Temp (!) 97.2 F (36.2 C) (Oral)   Ht 5\' 1"  (1.549 m)   Wt 134 lb 8 oz (61 kg)   BMI 25.41 kg/m   BP Readings from Last 3 Encounters:  04/26/17 (!) 99/48  01/15/17 113/63  10/15/16 (!) 100/56    Wt Readings from Last 3 Encounters:  04/26/17 134 lb 8 oz (61 kg)  10/15/16 135 lb (61.2 kg)  07/12/16 138 lb (62.6 kg)     Physical Exam  Constitutional: She is oriented to person, place, and time. She appears well-developed and well-nourished. No distress.  HENT:  Head: Normocephalic and atraumatic.  Right Ear: External ear normal.  Left Ear: External ear normal.  Neck: Normal range of motion. Neck supple.  Cardiovascular: Normal rate, regular rhythm and normal heart sounds.  No murmur heard. Pulmonary/Chest: Effort normal and breath sounds normal. No respiratory distress. She has no wheezes. She has no rales.  Abdominal: Soft. There is no tenderness.  Musculoskeletal: Normal range of motion. She exhibits edema (some stinging and bluish discoloration with swelling at both loer legs).  Neurological: She is alert and oriented to person, place, and time. She exhibits normal muscle tone. Coordination normal.  Skin: Skin is warm and dry.  Psychiatric: She has a normal mood and affect. Her behavior is normal.      Assessment & Plan:   Sheena Morrow was seen today for medication refill.  Diagnoses and all orders for this visit:  Opioid dependence, uncomplicated ([place]) -     ToxASSURE Select 13 (MW), Urine  Postherpetic neuralgia -  Discontinue: methadone (DOLOPHINE) 10 MG tablet; Take 1 tablet (10 mg total) by mouth 3 (three) times daily. -     Discontinue: methadone (DOLOPHINE) 10 MG tablet; Take 1 tablet (10 mg total) by mouth 3 (three) times daily. -     Discontinue: methadone (DOLOPHINE) 10 MG tablet; Take 1 tablet (10 mg total) by mouth 3 (three) times daily. -     methadone (DOLOPHINE) 10 MG tablet; Take 1 tablet (10 mg total) by  mouth 3 (three) times daily. -     methadone (DOLOPHINE) 10 MG tablet; Take 1 tablet (10 mg total) by mouth 3 (three) times daily. -     methadone (DOLOPHINE) 10 MG tablet; Take 1 tablet (10 mg total) by mouth 3 (three) times daily.  Varicose veins of lower extremity, unspecified laterality, unspecified whether complicated -     Ambulatory referral to Vascular Surgery  Other orders -     meloxicam (MOBIC) 15 MG tablet; TAKE 1 TABLET BY MOUTH EVERY DAY WITH FOOD FOR BACK PAIN       I am having Sheena Morrow maintain her mometasone, nitroGLYCERIN, triamcinolone cream, minocycline, lisinopril, metoprolol succinate, montelukast, polyethylene glycol powder, lactulose, atorvastatin, Ferrous Fumarate, gabapentin, linaclotide, omeprazole, Rivaroxaban, meloxicam, methadone, methadone, and methadone.  Allergies as of 04/26/2017   No Known Allergies     Medication List        Accurate as of 04/26/17 11:59 PM. Always use your most recent med list.          atorvastatin 20 MG tablet Commonly known as:  LIPITOR Take 1 tablet (20 mg total) by mouth daily.   Ferrous Fumarate 324 (106 Fe) MG Tabs tablet Commonly known as:  HEMOCYTE - 106 mg FE Take 1 tablet (106 mg of iron total) by mouth 2 (two) times daily. For iron deficiency anemia   gabapentin 800 MG tablet Commonly known as:  NEURONTIN TAKE 1 TABLET EACH MORNING,TAKE 1 TABLET EACH AFTERNOON & 2 TABLETS EACH EVENING   lactulose 10 GM/15ML solution Commonly known as:  CHRONULAC Take 45 mLs (30 g total) by mouth 2 (two) times daily.   linaclotide 145 MCG Caps capsule Commonly known as:  LINZESS TAKE 1 CAPSULE TWICE DAILY FOR CONSTIPATION   lisinopril 5 MG tablet Commonly known as:  PRINIVIL,ZESTRIL Take 1 tablet (5 mg total) by mouth daily.   meloxicam 15 MG tablet Commonly known as:  MOBIC TAKE 1 TABLET BY MOUTH EVERY DAY WITH FOOD FOR BACK PAIN   methadone 10 MG tablet Commonly known as:  DOLOPHINE Take 1 tablet (10 mg  total) by mouth 3 (three) times daily.   methadone 10 MG tablet Commonly known as:  DOLOPHINE Take 1 tablet (10 mg total) by mouth 3 (three) times daily. Start taking on:  05/26/2017   methadone 10 MG tablet Commonly known as:  DOLOPHINE Take 1 tablet (10 mg total) by mouth 3 (three) times daily. Start taking on:  06/25/2017   metoprolol succinate 25 MG 24 hr tablet Commonly known as:  TOPROL-XL Take 1 tablet (25 mg total) by mouth daily.   minocycline 100 MG capsule Commonly known as:  MINOCIN Take 1 capsule (100 mg total) by mouth 2 (two) times daily.   mometasone 0.1 % cream Commonly known as:  ELOCON To affected areas   montelukast 10 MG tablet Commonly known as:  SINGULAIR Take 1 tablet (10 mg total) by mouth at bedtime.   nitroGLYCERIN 0.4 MG SL tablet Commonly known as:  NITROSTAT Place under  the tongue.   omeprazole 20 MG capsule Commonly known as:  PRILOSEC Take 1 capsule (20 mg total) by mouth daily.   polyethylene glycol powder powder Commonly known as:  GLYCOLAX/MIRALAX USE 1 DOSING CUP ORALLY IN 8 OZ. OF WATER DAILY   Rivaroxaban 15 MG Tabs tablet Commonly known as:  XARELTO Take 1 tablet (15 mg total) by mouth daily with supper.   triamcinolone cream 0.1 % Commonly known as:  KENALOG APPLY TOPICALLY 2 (TWO) TIMES DAILY. TO THE SCALP AS NEEDED        Follow-up: Return in about 3 months (around 07/27/2017).  Claretta Fraise, M.D.

## 2017-05-06 ENCOUNTER — Encounter: Payer: Self-pay | Admitting: Vascular Surgery

## 2017-05-13 ENCOUNTER — Encounter: Payer: Self-pay | Admitting: Vascular Surgery

## 2017-05-16 ENCOUNTER — Telehealth: Payer: Self-pay | Admitting: Family Medicine

## 2017-05-16 NOTE — Telephone Encounter (Signed)
Patient refused appt  

## 2017-05-27 DIAGNOSIS — H26491 Other secondary cataract, right eye: Secondary | ICD-10-CM | POA: Diagnosis not present

## 2017-06-11 ENCOUNTER — Telehealth: Payer: Self-pay

## 2017-06-11 NOTE — Telephone Encounter (Signed)
Please call me

## 2017-06-12 NOTE — Telephone Encounter (Signed)
Attempted to contact patient - NA °

## 2017-06-13 ENCOUNTER — Telehealth: Payer: Self-pay | Admitting: Family Medicine

## 2017-06-13 ENCOUNTER — Other Ambulatory Visit: Payer: Self-pay

## 2017-06-13 DIAGNOSIS — I8390 Asymptomatic varicose veins of unspecified lower extremity: Secondary | ICD-10-CM

## 2017-06-13 NOTE — Telephone Encounter (Signed)
OK to order referral, dx varicose veins. She has referral in from two months ago, is that location ok?

## 2017-06-13 NOTE — Telephone Encounter (Signed)
Referral done

## 2017-06-21 DIAGNOSIS — I83813 Varicose veins of bilateral lower extremities with pain: Secondary | ICD-10-CM | POA: Diagnosis not present

## 2017-06-25 DIAGNOSIS — L120 Bullous pemphigoid: Secondary | ICD-10-CM | POA: Diagnosis not present

## 2017-06-25 DIAGNOSIS — L819 Disorder of pigmentation, unspecified: Secondary | ICD-10-CM | POA: Diagnosis not present

## 2017-06-25 DIAGNOSIS — T364X5A Adverse effect of tetracyclines, initial encounter: Secondary | ICD-10-CM | POA: Diagnosis not present

## 2017-06-26 ENCOUNTER — Encounter: Payer: Self-pay | Admitting: Family Medicine

## 2017-06-26 DIAGNOSIS — L120 Bullous pemphigoid: Secondary | ICD-10-CM | POA: Insufficient documentation

## 2017-07-23 ENCOUNTER — Other Ambulatory Visit: Payer: Self-pay | Admitting: Family Medicine

## 2017-07-24 DIAGNOSIS — I5022 Chronic systolic (congestive) heart failure: Secondary | ICD-10-CM | POA: Diagnosis not present

## 2017-07-24 DIAGNOSIS — I2510 Atherosclerotic heart disease of native coronary artery without angina pectoris: Secondary | ICD-10-CM | POA: Diagnosis not present

## 2017-07-30 ENCOUNTER — Other Ambulatory Visit: Payer: Self-pay | Admitting: Family Medicine

## 2017-08-04 ENCOUNTER — Other Ambulatory Visit: Payer: Self-pay | Admitting: Family Medicine

## 2017-08-11 ENCOUNTER — Other Ambulatory Visit: Payer: Self-pay | Admitting: Family Medicine

## 2017-08-16 ENCOUNTER — Other Ambulatory Visit: Payer: Self-pay | Admitting: Family Medicine

## 2017-08-16 ENCOUNTER — Telehealth: Payer: Self-pay | Admitting: Family Medicine

## 2017-08-16 MED ORDER — METHADONE HCL 10 MG PO TABS: 10.0000 mg | ORAL_TABLET | Freq: Three times a day (TID) | ORAL | 0 refills | Status: DC

## 2017-08-16 NOTE — Telephone Encounter (Signed)
Pt aware Rx for 9 pills till her appt on Monday were sent to pharmacy

## 2017-08-16 NOTE — Telephone Encounter (Signed)
I sent in the requested prescription 

## 2017-08-16 NOTE — Telephone Encounter (Signed)
Appt made for Monday for med refill Pt took last one today Can 3 days worth of Methadone be sent to pharmacy till appt

## 2017-08-16 NOTE — Telephone Encounter (Signed)
PT is needing a refill on methadone (DOLOPHINE) 10 MG tablet(Expired) [facility] [place]

## 2017-08-19 ENCOUNTER — Ambulatory Visit (INDEPENDENT_AMBULATORY_CARE_PROVIDER_SITE_OTHER): Payer: Medicare Other | Admitting: Family Medicine

## 2017-08-19 ENCOUNTER — Encounter: Payer: Self-pay | Admitting: Family Medicine

## 2017-08-19 VITALS — BP 100/55 | HR 83 | Temp 97.3°F | Ht 61.0 in | Wt 140.2 lb

## 2017-08-19 DIAGNOSIS — I2510 Atherosclerotic heart disease of native coronary artery without angina pectoris: Secondary | ICD-10-CM | POA: Diagnosis not present

## 2017-08-19 DIAGNOSIS — R6889 Other general symptoms and signs: Secondary | ICD-10-CM | POA: Diagnosis not present

## 2017-08-19 DIAGNOSIS — I480 Paroxysmal atrial fibrillation: Secondary | ICD-10-CM

## 2017-08-19 DIAGNOSIS — Z79891 Long term (current) use of opiate analgesic: Secondary | ICD-10-CM | POA: Diagnosis not present

## 2017-08-19 DIAGNOSIS — F1120 Opioid dependence, uncomplicated: Secondary | ICD-10-CM | POA: Diagnosis not present

## 2017-08-19 DIAGNOSIS — R600 Localized edema: Secondary | ICD-10-CM | POA: Diagnosis not present

## 2017-08-19 DIAGNOSIS — M25519 Pain in unspecified shoulder: Secondary | ICD-10-CM | POA: Diagnosis not present

## 2017-08-19 DIAGNOSIS — B0229 Other postherpetic nervous system involvement: Secondary | ICD-10-CM

## 2017-08-19 MED ORDER — METHADONE HCL 10 MG PO TABS: 10.0000 mg | ORAL_TABLET | Freq: Three times a day (TID) | ORAL | 0 refills | Status: DC

## 2017-08-19 MED ORDER — LUBIPROSTONE 24 MCG PO CAPS: 24.0000 ug | ORAL_CAPSULE | Freq: Two times a day (BID) | ORAL | 5 refills | Status: DC

## 2017-08-19 NOTE — Progress Notes (Signed)
Subjective:  Patient ID: Sheena Morrow, female    DOB: 07/05/1933  Age: 84 y.o. MRN: [id number]  CC: Medical Management of Chronic Issues   HPI Sheena Morrow presents for Recheck of chronic pain.  Patient states her postherpetic neuralgia is primarily located in the mid back between shoulder blades.  10/10 when she misses methadone.  She states she ran out because she did not know when she was supposed to come back.  Usually her pain is 0-2/10 when taking her 3 times daily dosage which has been unchanged for at least a decade.   Patient in for follow-up of atrial fibrillation. Patient denies any recent bouts of chest pain or palpitations. Additionally, patient is taking anticoagulants. Patient denies any recent excessive bleeding episodes including epistaxis, bleeding from the gums, genitalia, rectal bleeding or hematuria. Additionally there has been no excessive bruising.  Patient in for follow-up of elevated cholesterol. Doing well without complaints on current medication. Denies side effects of statin including myalgia and arthralgia and nausea. Also in today for liver function testing. Currently no chest pain, shortness of breath or other cardiovascular related symptoms noted.  Patient reports that she does have shortness of breath with exertion and her right lower extremity does still swell at times.  In fact most of the time.  Depression screen [facility] 2/9 08/19/2017 04/26/2017 01/15/2017  Decreased Interest 0 0 0  Down, Depressed, Hopeless 0 0 0  PHQ - 2 Score 0 0 0    History Aritza has a past medical history of Clotting disorder ([place]), Constipation due to opioid therapy, Old MI (myocardial infarction) (07/21/2015), Osteoporosis, Postherpetic neuralgia, and Systolic CHF with reduced left ventricular function, NYHA class 2 ([place]) (05/21/2015).   She has no past surgical history on file.   Her family history is not on file.She reports that she quit smoking about 9 years ago. Her smoking use  included cigarettes. She started smoking about 48 years ago. She smoked 0.50 packs per day. She has never used smokeless tobacco. She reports that she does not drink alcohol or use drugs.    ROS Review of Systems  Constitutional: Negative.   HENT: Negative for congestion.   Eyes: Negative for visual disturbance.  Respiratory: Positive for shortness of breath.   Cardiovascular: Positive for leg swelling (Right lower extremity much greater than left lower extremity). Negative for chest pain.  Gastrointestinal: Negative for abdominal pain, constipation, diarrhea, nausea and vomiting.  Genitourinary: Negative for difficulty urinating.  Musculoskeletal: Negative for arthralgias and myalgias.  Neurological: Negative for headaches.       Neuropathic pain of postherpetic neuralgia at the mid back  Psychiatric/Behavioral: Negative for sleep disturbance.    Objective:  BP (!) 100/55   Pulse 83   Temp (!) 97.3 F (36.3 C) (Oral)   Ht '5\' 1"'  (1.549 m)   Wt 140 lb 4 oz (63.6 kg)   BMI 26.50 kg/m    BP Readings from Last 3 Encounters:  08/19/17 (!) 100/55  04/26/17 (!) 99/48  01/15/17 113/63    Wt Readings from Last 3 Encounters:  08/19/17 140 lb 4 oz (63.6 kg)  04/26/17 134 lb 8 oz (61 kg)  10/15/16 135 lb (61.2 kg)     Physical Exam  Constitutional: She is oriented to person, place, and time. She appears well-developed and well-nourished. No distress.  HENT:  Head: Normocephalic and atraumatic.  Eyes: Pupils are equal, round, and reactive to light. Conjunctivae are normal.  Neck: Normal range of motion. Neck supple.  No thyromegaly present.  Cardiovascular: Normal rate, regular rhythm and normal heart sounds.  No murmur heard. Pulmonary/Chest: Effort normal and breath sounds normal. No respiratory distress. She has no wheezes. She has no rales.  Abdominal: Soft. Bowel sounds are normal. She exhibits no distension. There is no tenderness.  Musculoskeletal: Normal range of motion.  She exhibits edema (2+ pretibial right Lower extremity.  Trace to 1+ left lower extremity).  Lymphadenopathy:    She has no cervical adenopathy.  Neurological: She is alert and oriented to person, place, and time.  Skin: Skin is warm and dry.  Psychiatric: She has a normal mood and affect. Her behavior is normal. Thought content normal.      Assessment & Plan:   Koral was seen today for medical management of chronic issues.  Diagnoses and all orders for this visit:  CAD in native artery -     CBC with Differential/Platelet -     CMP14+EGFR -     Lipid panel  Postherpetic neuralgia -     methadone (DOLOPHINE) 10 MG tablet; Take 1 tablet (10 mg total) by mouth 3 (three) times daily. -     methadone (DOLOPHINE) 10 MG tablet; Take 1 tablet (10 mg total) by mouth 3 (three) times daily. -     methadone (DOLOPHINE) 10 MG tablet; Take 1 tablet (10 mg total) by mouth 3 (three) times daily.  Opioid dependence, uncomplicated (HCC) -     ToxASSURE Select 13 (MW), Urine  Paroxysmal atrial fibrillation (HCC)  Other orders -     lubiprostone (AMITIZA) 24 MCG capsule; Take 1 capsule (24 mcg total) by mouth 2 (two) times daily with a meal. For constipation       I have discontinued Adonis Huguenin Debose's lactulose and linaclotide. I am also having her start on lubiprostone. Additionally, I am having her maintain her mometasone, nitroGLYCERIN, triamcinolone cream, minocycline, montelukast, polyethylene glycol powder, atorvastatin, Ferrous Fumarate, gabapentin, Rivaroxaban, metoprolol succinate, lisinopril, meloxicam, omeprazole, methadone, methadone, and methadone.  Allergies as of 08/19/2017   No Known Allergies     Medication List        Accurate as of 08/19/17 12:36 PM. Always use your most recent med list.          atorvastatin 20 MG tablet Commonly known as:  LIPITOR Take 1 tablet (20 mg total) by mouth daily.   Ferrous Fumarate 324 (106 Fe) MG Tabs tablet Commonly known as:   HEMOCYTE - 106 mg FE Take 1 tablet (106 mg of iron total) by mouth 2 (two) times daily. For iron deficiency anemia   gabapentin 800 MG tablet Commonly known as:  NEURONTIN TAKE 1 TABLET EACH MORNING,TAKE 1 TABLET EACH AFTERNOON & 2 TABLETS EACH EVENING   lisinopril 5 MG tablet Commonly known as:  PRINIVIL,ZESTRIL TAKE 1 TABLET BY MOUTH EVERY DAY   lubiprostone 24 MCG capsule Commonly known as:  AMITIZA Take 1 capsule (24 mcg total) by mouth 2 (two) times daily with a meal. For constipation   meloxicam 15 MG tablet Commonly known as:  MOBIC TAKE 1 TABLET BY MOUTH EVERY DAY WITH FOOD FOR BACK PAIN   methadone 10 MG tablet Commonly known as:  DOLOPHINE Take 1 tablet (10 mg total) by mouth 3 (three) times daily.   methadone 10 MG tablet Commonly known as:  DOLOPHINE Take 1 tablet (10 mg total) by mouth 3 (three) times daily. Start taking on:  09/18/2017   methadone 10 MG tablet Commonly known as:  DOLOPHINE Take  1 tablet (10 mg total) by mouth 3 (three) times daily. Start taking on:  10/18/2017   metoprolol succinate 25 MG 24 hr tablet Commonly known as:  TOPROL-XL TAKE 1 TABLET BY MOUTH EVERY DAY   minocycline 100 MG capsule Commonly known as:  MINOCIN Take 1 capsule (100 mg total) by mouth 2 (two) times daily.   mometasone 0.1 % cream Commonly known as:  ELOCON To affected areas   montelukast 10 MG tablet Commonly known as:  SINGULAIR Take 1 tablet (10 mg total) by mouth at bedtime.   nitroGLYCERIN 0.4 MG SL tablet Commonly known as:  NITROSTAT Place under the tongue.   omeprazole 20 MG capsule Commonly known as:  PRILOSEC TAKE 1 CAPSULE BY MOUTH EVERY DAY   polyethylene glycol powder powder Commonly known as:  GLYCOLAX/MIRALAX USE 1 DOSING CUP ORALLY IN 8 OZ. OF WATER DAILY   Rivaroxaban 15 MG Tabs tablet Commonly known as:  XARELTO Take 1 tablet (15 mg total) by mouth daily with supper.   triamcinolone cream 0.1 % Commonly known as:  KENALOG APPLY  TOPICALLY 2 (TWO) TIMES DAILY. TO THE SCALP AS NEEDED        Follow-up: Return in about 3 months (around 11/19/2017).  Claretta Fraise, M.D.

## 2017-08-20 LAB — CMP14+EGFR
ALT: 23 IU/L (ref 0–32)
AST: 58 IU/L — AB (ref 0–40)
Albumin/Globulin Ratio: 1.3 (ref 1.2–2.2)
Albumin: 3.7 g/dL (ref 3.5–4.7)
Alkaline Phosphatase: 96 IU/L (ref 39–117)
BUN/Creatinine Ratio: 16 (ref 12–28)
BUN: 24 mg/dL (ref 8–27)
Bilirubin Total: 0.2 mg/dL (ref 0.0–1.2)
CALCIUM: 9.1 mg/dL (ref 8.7–10.3)
CO2: 24 mmol/L (ref 20–29)
CREATININE: 1.47 mg/dL — AB (ref 0.57–1.00)
Chloride: 103 mmol/L (ref 96–106)
GFR, EST AFRICAN AMERICAN: 38 mL/min/{1.73_m2} — AB (ref >59)
GFR, EST NON AFRICAN AMERICAN: 33 mL/min/{1.73_m2} — AB (ref >59)
GLUCOSE: 89 mg/dL (ref 65–99)
Globulin, Total: 2.9 g/dL (ref 1.5–4.5)
Potassium: 4.2 mmol/L (ref 3.5–5.2)
Sodium: 141 mmol/L (ref 134–144)
TOTAL PROTEIN: 6.6 g/dL (ref 6.0–8.5)

## 2017-08-20 LAB — CBC WITH DIFFERENTIAL/PLATELET
BASOS ABS: 0.1 10*3/uL (ref 0.0–0.2)
Basos: 1 %
EOS (ABSOLUTE): 0.3 10*3/uL (ref 0.0–0.4)
EOS: 5 %
HEMATOCRIT: 28 % — AB (ref 34.0–46.6)
HEMOGLOBIN: 8.2 g/dL — AB (ref 11.1–15.9)
IMMATURE GRANULOCYTES: 0 %
Immature Grans (Abs): 0 10*3/uL (ref 0.0–0.1)
LYMPHS: 30 %
Lymphocytes Absolute: 1.7 10*3/uL (ref 0.7–3.1)
MCH: 29.2 pg (ref 26.6–33.0)
MCHC: 29.3 g/dL — AB (ref 31.5–35.7)
MCV: 100 fL — ABNORMAL HIGH (ref 79–97)
MONOCYTES: 9 %
Monocytes Absolute: 0.5 10*3/uL (ref 0.1–0.9)
NEUTROS PCT: 55 %
Neutrophils Absolute: 3.2 10*3/uL (ref 1.4–7.0)
Platelets: 254 10*3/uL (ref 150–450)
RBC: 2.81 x10E6/uL — AB (ref 3.77–5.28)
RDW: 15.6 % — AB (ref 12.3–15.4)
WBC: 5.7 10*3/uL (ref 3.4–10.8)

## 2017-08-20 LAB — LIPID PANEL
CHOL/HDL RATIO: 2.2 ratio (ref 0.0–4.4)
Cholesterol, Total: 113 mg/dL (ref 100–199)
HDL: 51 mg/dL (ref >39)
LDL CALC: 40 mg/dL (ref 0–99)
TRIGLYCERIDES: 109 mg/dL (ref 0–149)
VLDL CHOLESTEROL CAL: 22 mg/dL (ref 5–40)

## 2017-08-22 LAB — FE+TIBC+FER+B12+FOLIC
FERRITIN: 32 ng/mL (ref 15–150)
Folate: 19 ng/mL (ref >3.0)
IRON SATURATION: 7 % — AB (ref 15–55)
Iron: 24 ug/dL — ABNORMAL LOW (ref 27–139)
TIBC: 351 ug/dL (ref 250–450)
UIBC: 327 ug/dL (ref 118–369)
VITAMIN B 12: 477 pg/mL (ref 232–1245)

## 2017-08-22 LAB — SPECIMEN STATUS REPORT

## 2017-08-22 LAB — TOXASSURE SELECT 13 (MW), URINE

## 2017-08-27 ENCOUNTER — Telehealth: Payer: Self-pay | Admitting: Family Medicine

## 2017-08-27 NOTE — Telephone Encounter (Signed)
Spoke with MMM and she advised the [place] can take 2 weeks or so to start working and could use Miralax with it as well if needed. Advised pt of above and pt voiced understanding.

## 2017-09-25 DIAGNOSIS — L819 Disorder of pigmentation, unspecified: Secondary | ICD-10-CM | POA: Diagnosis not present

## 2017-09-25 DIAGNOSIS — L120 Bullous pemphigoid: Secondary | ICD-10-CM | POA: Diagnosis not present

## 2017-09-25 DIAGNOSIS — T364X5A Adverse effect of tetracyclines, initial encounter: Secondary | ICD-10-CM | POA: Diagnosis not present

## 2017-09-25 DIAGNOSIS — R609 Edema, unspecified: Secondary | ICD-10-CM | POA: Diagnosis not present

## 2017-09-26 DIAGNOSIS — N183 Chronic kidney disease, stage 3 (moderate): Secondary | ICD-10-CM | POA: Diagnosis not present

## 2017-09-26 DIAGNOSIS — N200 Calculus of kidney: Secondary | ICD-10-CM | POA: Diagnosis not present

## 2017-10-03 ENCOUNTER — Other Ambulatory Visit: Payer: Self-pay | Admitting: Family Medicine

## 2017-10-03 ENCOUNTER — Telehealth: Payer: Self-pay | Admitting: Family Medicine

## 2017-10-03 MED ORDER — FUROSEMIDE 20 MG PO TABS: 20.0000 mg | ORAL_TABLET | Freq: Every day | ORAL | 1 refills | Status: DC

## 2017-10-03 NOTE — Telephone Encounter (Signed)
Patient is calling to see if there is anything that can be prescribed for her and/or if you have any suggestions; both of her feet, ankles and the lower part of her legs are swollen; there is no redness or weeping.  She said the swelling has gone down some in the mornings but comes back during the day.  She is keeping her legs propped up.  Please advise.

## 2017-10-03 NOTE — Telephone Encounter (Signed)
I sent in the requested prescription 

## 2017-10-04 NOTE — Telephone Encounter (Signed)
No answer, no voicemail.

## 2017-10-04 NOTE — Telephone Encounter (Signed)
Duplicate encounter

## 2017-10-07 NOTE — Telephone Encounter (Signed)
Patient aware of prescription

## 2017-10-27 ENCOUNTER — Other Ambulatory Visit: Payer: Self-pay | Admitting: Family Medicine

## 2017-10-28 NOTE — Telephone Encounter (Signed)
Ov 11/19/17

## 2017-10-29 ENCOUNTER — Other Ambulatory Visit: Payer: Self-pay | Admitting: Family Medicine

## 2017-11-06 ENCOUNTER — Other Ambulatory Visit: Payer: Self-pay | Admitting: Family Medicine

## 2017-11-07 ENCOUNTER — Other Ambulatory Visit: Payer: Self-pay | Admitting: Family Medicine

## 2017-11-07 DIAGNOSIS — R600 Localized edema: Secondary | ICD-10-CM | POA: Diagnosis not present

## 2017-11-07 DIAGNOSIS — R609 Edema, unspecified: Secondary | ICD-10-CM | POA: Diagnosis not present

## 2017-11-07 DIAGNOSIS — I482 Chronic atrial fibrillation, unspecified: Secondary | ICD-10-CM | POA: Diagnosis not present

## 2017-11-08 DIAGNOSIS — R600 Localized edema: Secondary | ICD-10-CM | POA: Diagnosis not present

## 2017-11-17 ENCOUNTER — Other Ambulatory Visit: Payer: Self-pay | Admitting: Family Medicine

## 2017-11-18 NOTE — Telephone Encounter (Signed)
Patient wants refill on Xeroto completley out.

## 2017-11-19 ENCOUNTER — Ambulatory Visit: Payer: Medicare Other | Admitting: Family Medicine

## 2017-11-19 ENCOUNTER — Ambulatory Visit (INDEPENDENT_AMBULATORY_CARE_PROVIDER_SITE_OTHER): Payer: Medicare Other | Admitting: Family Medicine

## 2017-11-19 ENCOUNTER — Encounter: Payer: Self-pay | Admitting: Family Medicine

## 2017-11-19 VITALS — BP 98/58 | HR 85 | Temp 98.4°F | Ht 61.0 in | Wt 134.6 lb

## 2017-11-19 DIAGNOSIS — Z23 Encounter for immunization: Secondary | ICD-10-CM

## 2017-11-19 DIAGNOSIS — B0229 Other postherpetic nervous system involvement: Secondary | ICD-10-CM

## 2017-11-19 DIAGNOSIS — I2510 Atherosclerotic heart disease of native coronary artery without angina pectoris: Secondary | ICD-10-CM

## 2017-11-19 DIAGNOSIS — R600 Localized edema: Secondary | ICD-10-CM | POA: Diagnosis not present

## 2017-11-19 DIAGNOSIS — F1120 Opioid dependence, uncomplicated: Secondary | ICD-10-CM | POA: Diagnosis not present

## 2017-11-19 MED ORDER — METHADONE HCL 10 MG PO TABS: 10.0000 mg | ORAL_TABLET | Freq: Three times a day (TID) | ORAL | 0 refills | Status: DC

## 2017-11-19 NOTE — Progress Notes (Signed)
Subjective:  Patient ID: Sheena Morrow, female    DOB: Sep 28, 1933  Age: 84 y.o. MRN: [id number]  CC: Coronary Artery Disease (3 mos ckup); Peripheral Neuropathy; Medication Refill; and Leg Swelling (leg swelling & pain, since July)   HPI Sheena Morrow presents for recheck of chronic PH N pain. Occurs in the mid back. Controlled on constant dose of methadone for over a decade. Pain diary returned, reviewed, sent for scan to chart. Show pain to be at minimal levels. Pt. Says much higher if she misses a dose. PMP aware is unremarkable.  Pt. Still constipated but cannot afford linzess, etc. Maintained on miralax.  Depression screen [facility] 2/9 08/19/2017 04/26/2017 01/15/2017  Decreased Interest 0 0 0  Down, Depressed, Hopeless 0 0 0  PHQ - 2 Score 0 0 0    History Sheena Morrow has a past medical history of Clotting disorder ([place]), Constipation due to opioid therapy, Old MI (myocardial infarction) (07/21/2015), Osteoporosis, Postherpetic neuralgia, and Systolic CHF with reduced left ventricular function, NYHA class 2 ([place]) (05/21/2015).   She has no past surgical history on file.   Her family history is not on file.She reports that she quit smoking about 9 years ago. Her smoking use included cigarettes. She started smoking about 48 years ago. She smoked 0.50 packs per day. She has never used smokeless tobacco. She reports that she does not drink alcohol or use drugs.    ROS Review of Systems  Constitutional: Negative.   HENT: Negative.   Eyes: Negative for visual disturbance.  Respiratory: Negative for shortness of breath.   Cardiovascular: Negative for chest pain.  Gastrointestinal: Negative for abdominal pain.  Musculoskeletal: Positive for back pain and myalgias.    Objective:  BP (!) 98/58   Pulse 85   Temp 98.4 F (36.9 C) (Oral)   Ht _0  (1.549 m)   Wt 134 lb 9.6 oz (61.1 kg)   BMI 25.43 kg/m   BP Readings from Last 3 Encounters:  11/19/17 (!) 98/58  08/19/17 (!) 100/55    04/26/17 (!) 99/48    Wt Readings from Last 3 Encounters:  11/19/17 134 lb 9.6 oz (61.1 kg)  08/19/17 140 lb 4 oz (63.6 kg)  04/26/17 134 lb 8 oz (61 kg)     Physical Exam  Constitutional: She is oriented to person, place, and time. She appears well-developed and well-nourished. No distress.  Cardiovascular: Normal rate and regular rhythm.  Pulmonary/Chest: Breath sounds normal.  Abdominal: Soft. She exhibits no distension.  Neurological: She is alert and oriented to person, place, and time.  Skin: Skin is warm and dry.  Psychiatric: She has a normal mood and affect.      Assessment & Plan:   Sheena Morrow was seen today for coronary artery disease, peripheral neuropathy, medication refill and leg swelling.  Diagnoses and all orders for this visit:  Postherpetic neuralgia -     methadone (DOLOPHINE) 10 MG tablet; Take 1 tablet (10 mg total) by mouth 3 (three) times daily. -     methadone (DOLOPHINE) 10 MG tablet; Take 1 tablet (10 mg total) by mouth 3 (three) times daily. -     methadone (DOLOPHINE) 10 MG tablet; Take 1 tablet (10 mg total) by mouth 3 (three) times daily.  Opioid dependence, uncomplicated ([place]) -     ToxASSURE Select 13 (MW), Urine  Encounter for immunization -     Flu vaccine HIGH DOSE PF  Localized edema -     CMP14+EGFR  I have discontinued Sheena Morrow's minocycline. I am also having her maintain her mometasone, nitroGLYCERIN, triamcinolone cream, polyethylene glycol powder, atorvastatin, Ferrous Fumarate, gabapentin, metoprolol succinate, meloxicam, lubiprostone, lisinopril, furosemide, omeprazole, montelukast, XARELTO, methadone, methadone, and methadone.  Allergies as of 11/19/2017   No Known Allergies     Medication List        Accurate as of 11/19/17 11:59 PM. Always use your most recent med list.          atorvastatin 20 MG tablet Commonly known as:  LIPITOR Take 1 tablet (20 mg total) by mouth daily.   Ferrous Fumarate 324  (106 Fe) MG Tabs tablet Commonly known as:  HEMOCYTE - 106 mg FE Take 1 tablet (106 mg of iron total) by mouth 2 (two) times daily. For iron deficiency anemia   furosemide 20 MG tablet Commonly known as:  LASIX TAKE 1 TABLET (20 MG TOTAL) BY MOUTH DAILY. FOR SWELLING   gabapentin 800 MG tablet Commonly known as:  NEURONTIN TAKE 1 TABLET EACH MORNING,TAKE 1 TABLET EACH AFTERNOON & 2 TABLETS EACH EVENING   lisinopril 5 MG tablet Commonly known as:  PRINIVIL,ZESTRIL TAKE 1 TABLET BY MOUTH EVERY DAY   lubiprostone 24 MCG capsule Commonly known as:  AMITIZA Take 1 capsule (24 mcg total) by mouth 2 (two) times daily with a meal. For constipation   meloxicam 15 MG tablet Commonly known as:  MOBIC TAKE 1 TABLET BY MOUTH EVERY DAY WITH FOOD FOR BACK PAIN   methadone 10 MG tablet Commonly known as:  DOLOPHINE Take 1 tablet (10 mg total) by mouth 3 (three) times daily.   methadone 10 MG tablet Commonly known as:  DOLOPHINE Take 1 tablet (10 mg total) by mouth 3 (three) times daily. Start taking on:  12/19/2017   methadone 10 MG tablet Commonly known as:  DOLOPHINE Take 1 tablet (10 mg total) by mouth 3 (three) times daily. Start taking on:  01/18/2018   metoprolol succinate 25 MG 24 hr tablet Commonly known as:  TOPROL-XL TAKE 1 TABLET BY MOUTH EVERY DAY   mometasone 0.1 % cream Commonly known as:  ELOCON To affected areas   montelukast 10 MG tablet Commonly known as:  SINGULAIR TAKE 1 TABLET BY MOUTH EVERYDAY AT BEDTIME   nitroGLYCERIN 0.4 MG SL tablet Commonly known as:  NITROSTAT Place under the tongue.   omeprazole 20 MG capsule Commonly known as:  PRILOSEC TAKE 1 CAPSULE BY MOUTH EVERY DAY   polyethylene glycol powder powder Commonly known as:  GLYCOLAX/MIRALAX USE 1 DOSING CUP ORALLY IN 8 OZ. OF WATER DAILY   triamcinolone cream 0.1 % Commonly known as:  KENALOG APPLY TOPICALLY 2 (TWO) TIMES DAILY. TO THE SCALP AS NEEDED   XARELTO 15 MG Tabs  tablet Generic drug:  Rivaroxaban TAKE 1 TABLET (15 MG TOTAL) BY MOUTH 2 (TWO) TIMES DAILY WITH A MEAL.        Follow-up: Return in about 3 months (around 02/19/2018).  Claretta Fraise, M.D.

## 2017-11-20 LAB — CMP14+EGFR
A/G RATIO: 1.3 (ref 1.2–2.2)
ALT: 14 IU/L (ref 0–32)
AST: 46 IU/L — ABNORMAL HIGH (ref 0–40)
Albumin: 4 g/dL (ref 3.5–4.7)
Alkaline Phosphatase: 103 IU/L (ref 39–117)
BUN/Creatinine Ratio: 25 (ref 12–28)
BUN: 37 mg/dL — AB (ref 8–27)
Bilirubin Total: <0.2 mg/dL (ref 0.0–1.2)
CALCIUM: 9 mg/dL (ref 8.7–10.3)
CO2: 26 mmol/L (ref 20–29)
Chloride: 100 mmol/L (ref 96–106)
Creatinine, Ser: 1.5 mg/dL — ABNORMAL HIGH (ref 0.57–1.00)
GFR calc Af Amer: 37 mL/min/{1.73_m2} — ABNORMAL LOW (ref >59)
GFR, EST NON AFRICAN AMERICAN: 32 mL/min/{1.73_m2} — AB (ref >59)
GLUCOSE: 94 mg/dL (ref 65–99)
Globulin, Total: 3.2 g/dL (ref 1.5–4.5)
POTASSIUM: 4.2 mmol/L (ref 3.5–5.2)
Sodium: 141 mmol/L (ref 134–144)
TOTAL PROTEIN: 7.2 g/dL (ref 6.0–8.5)

## 2017-11-23 LAB — TOXASSURE SELECT 13 (MW), URINE

## 2017-12-06 DIAGNOSIS — R600 Localized edema: Secondary | ICD-10-CM | POA: Diagnosis not present

## 2017-12-20 ENCOUNTER — Other Ambulatory Visit: Payer: Self-pay | Admitting: Family Medicine

## 2017-12-31 DIAGNOSIS — L120 Bullous pemphigoid: Secondary | ICD-10-CM | POA: Diagnosis not present

## 2018-02-02 ENCOUNTER — Other Ambulatory Visit: Payer: Self-pay | Admitting: Family Medicine

## 2018-02-03 NOTE — Telephone Encounter (Signed)
OV 02/19/18

## 2018-02-05 ENCOUNTER — Other Ambulatory Visit: Payer: Self-pay | Admitting: Family Medicine

## 2018-02-05 ENCOUNTER — Telehealth: Payer: Self-pay | Admitting: Family Medicine

## 2018-02-05 MED ORDER — LINACLOTIDE 290 MCG PO CAPS: 290.0000 ug | ORAL_CAPSULE | Freq: Every day | ORAL | 5 refills | Status: DC

## 2018-02-05 NOTE — Telephone Encounter (Signed)
I sent in the requested prescription 

## 2018-02-05 NOTE — Telephone Encounter (Signed)
Can a new script be sent in for patient?

## 2018-02-06 ENCOUNTER — Other Ambulatory Visit: Payer: Self-pay | Admitting: Family Medicine

## 2018-02-12 ENCOUNTER — Other Ambulatory Visit: Payer: Self-pay | Admitting: Family Medicine

## 2018-02-12 NOTE — Telephone Encounter (Signed)
OV 02/19/18

## 2018-02-12 NOTE — Telephone Encounter (Signed)
No response. Script was done.

## 2018-02-19 ENCOUNTER — Encounter: Payer: Self-pay | Admitting: Family Medicine

## 2018-02-19 ENCOUNTER — Ambulatory Visit (INDEPENDENT_AMBULATORY_CARE_PROVIDER_SITE_OTHER): Payer: Medicare HMO | Admitting: Family Medicine

## 2018-02-19 ENCOUNTER — Other Ambulatory Visit: Payer: Self-pay | Admitting: Family Medicine

## 2018-02-19 DIAGNOSIS — B0229 Other postherpetic nervous system involvement: Secondary | ICD-10-CM | POA: Diagnosis not present

## 2018-02-19 MED ORDER — METHADONE HCL 10 MG PO TABS: 10.0000 mg | ORAL_TABLET | Freq: Three times a day (TID) | ORAL | 0 refills | Status: DC

## 2018-02-19 MED ORDER — FERROUS FUMARATE 324 (106 FE) MG PO TABS: 106.0000 mg | ORAL_TABLET | Freq: Two times a day (BID) | ORAL | 5 refills | Status: DC

## 2018-02-19 MED ORDER — MONTELUKAST SODIUM 10 MG PO TABS: ORAL_TABLET | ORAL | 1 refills | Status: DC

## 2018-02-19 MED ORDER — FUROSEMIDE 20 MG PO TABS: 20.0000 mg | ORAL_TABLET | Freq: Every day | ORAL | 1 refills | Status: DC

## 2018-02-19 MED ORDER — MELOXICAM 15 MG PO TABS: ORAL_TABLET | ORAL | 1 refills | Status: DC

## 2018-02-19 MED ORDER — ATORVASTATIN CALCIUM 20 MG PO TABS: 20.0000 mg | ORAL_TABLET | Freq: Every day | ORAL | 1 refills | Status: DC

## 2018-02-19 MED ORDER — METOPROLOL SUCCINATE ER 25 MG PO TB24: 25.0000 mg | ORAL_TABLET | Freq: Every day | ORAL | 1 refills | Status: DC

## 2018-02-19 MED ORDER — LISINOPRIL 5 MG PO TABS: 5.0000 mg | ORAL_TABLET | Freq: Every day | ORAL | 1 refills | Status: DC

## 2018-02-19 MED ORDER — RIVAROXABAN 15 MG PO TABS: 15.0000 mg | ORAL_TABLET | Freq: Every day | ORAL | 1 refills | Status: DC

## 2018-02-20 ENCOUNTER — Telehealth: Payer: Self-pay | Admitting: *Deleted

## 2018-02-20 DIAGNOSIS — K2970 Gastritis, unspecified, without bleeding: Secondary | ICD-10-CM | POA: Diagnosis not present

## 2018-02-20 DIAGNOSIS — N183 Chronic kidney disease, stage 3 unspecified: Secondary | ICD-10-CM | POA: Insufficient documentation

## 2018-02-20 DIAGNOSIS — D509 Iron deficiency anemia, unspecified: Secondary | ICD-10-CM | POA: Diagnosis not present

## 2018-02-20 DIAGNOSIS — I5020 Unspecified systolic (congestive) heart failure: Secondary | ICD-10-CM | POA: Diagnosis not present

## 2018-02-20 DIAGNOSIS — K219 Gastro-esophageal reflux disease without esophagitis: Secondary | ICD-10-CM | POA: Diagnosis not present

## 2018-02-20 DIAGNOSIS — I252 Old myocardial infarction: Secondary | ICD-10-CM | POA: Diagnosis not present

## 2018-02-20 DIAGNOSIS — I482 Chronic atrial fibrillation, unspecified: Secondary | ICD-10-CM | POA: Diagnosis not present

## 2018-02-20 DIAGNOSIS — I272 Pulmonary hypertension, unspecified: Secondary | ICD-10-CM | POA: Diagnosis not present

## 2018-02-20 DIAGNOSIS — J45909 Unspecified asthma, uncomplicated: Secondary | ICD-10-CM | POA: Diagnosis not present

## 2018-02-20 DIAGNOSIS — D631 Anemia in chronic kidney disease: Secondary | ICD-10-CM | POA: Diagnosis not present

## 2018-02-20 DIAGNOSIS — I4891 Unspecified atrial fibrillation: Secondary | ICD-10-CM | POA: Diagnosis not present

## 2018-02-20 DIAGNOSIS — B0229 Other postherpetic nervous system involvement: Secondary | ICD-10-CM | POA: Diagnosis not present

## 2018-02-20 DIAGNOSIS — I2510 Atherosclerotic heart disease of native coronary artery without angina pectoris: Secondary | ICD-10-CM | POA: Diagnosis not present

## 2018-02-20 DIAGNOSIS — D649 Anemia, unspecified: Secondary | ICD-10-CM | POA: Diagnosis not present

## 2018-02-20 DIAGNOSIS — D122 Benign neoplasm of ascending colon: Secondary | ICD-10-CM | POA: Diagnosis not present

## 2018-02-20 LAB — CBC WITH DIFFERENTIAL/PLATELET
Basophils Absolute: 0.1 10*3/uL (ref 0.0–0.2)
Basos: 1 %
EOS (ABSOLUTE): 0.2 10*3/uL (ref 0.0–0.4)
Eos: 4 %
Hematocrit: 24.5 % — ABNORMAL LOW (ref 34.0–46.6)
Hemoglobin: 6.8 g/dL — CL (ref 11.1–15.9)
Immature Grans (Abs): 0 10*3/uL (ref 0.0–0.1)
Immature Granulocytes: 0 %
LYMPHS ABS: 1.7 10*3/uL (ref 0.7–3.1)
Lymphs: 37 %
MCH: 24.7 pg — ABNORMAL LOW (ref 26.6–33.0)
MCHC: 27.8 g/dL — ABNORMAL LOW (ref 31.5–35.7)
MCV: 89 fL (ref 79–97)
MONOCYTES: 10 %
Monocytes Absolute: 0.5 10*3/uL (ref 0.1–0.9)
NEUTROS ABS: 2.2 10*3/uL (ref 1.4–7.0)
Neutrophils: 48 %
Platelets: 266 10*3/uL (ref 150–450)
RBC: 2.75 x10E6/uL — ABNORMAL LOW (ref 3.77–5.28)
RDW: 18.2 % — ABNORMAL HIGH (ref 11.7–15.4)
WBC: 4.6 10*3/uL (ref 3.4–10.8)

## 2018-02-20 LAB — CMP14+EGFR
ALBUMIN: 4.1 g/dL (ref 3.6–4.6)
ALT: 25 IU/L (ref 0–32)
AST: 55 IU/L — ABNORMAL HIGH (ref 0–40)
Albumin/Globulin Ratio: 1.4 (ref 1.2–2.2)
Alkaline Phosphatase: 90 IU/L (ref 39–117)
BUN / CREAT RATIO: 27 (ref 12–28)
BUN: 44 mg/dL — AB (ref 8–27)
Bilirubin Total: <0.2 mg/dL (ref 0.0–1.2)
CO2: 25 mmol/L (ref 20–29)
CREATININE: 1.63 mg/dL — AB (ref 0.57–1.00)
Calcium: 9.4 mg/dL (ref 8.7–10.3)
Chloride: 99 mmol/L (ref 96–106)
GFR calc Af Amer: 34 mL/min/{1.73_m2} — ABNORMAL LOW (ref >59)
GFR calc non Af Amer: 29 mL/min/{1.73_m2} — ABNORMAL LOW (ref >59)
Globulin, Total: 3 g/dL (ref 1.5–4.5)
Glucose: 85 mg/dL (ref 65–99)
Potassium: 4.5 mmol/L (ref 3.5–5.2)
Sodium: 140 mmol/L (ref 134–144)
Total Protein: 7.1 g/dL (ref 6.0–8.5)

## 2018-02-20 NOTE — Telephone Encounter (Signed)
Per Dr Livia Snellen pt was advised to go to ED due to hgb level of 6.8. Pt voiced understanding and says she will go.

## 2018-02-21 DIAGNOSIS — D649 Anemia, unspecified: Secondary | ICD-10-CM | POA: Diagnosis not present

## 2018-02-21 DIAGNOSIS — D509 Iron deficiency anemia, unspecified: Secondary | ICD-10-CM | POA: Diagnosis not present

## 2018-02-22 DIAGNOSIS — K635 Polyp of colon: Secondary | ICD-10-CM | POA: Diagnosis not present

## 2018-02-22 DIAGNOSIS — K5520 Angiodysplasia of colon without hemorrhage: Secondary | ICD-10-CM | POA: Diagnosis not present

## 2018-02-22 DIAGNOSIS — D122 Benign neoplasm of ascending colon: Secondary | ICD-10-CM | POA: Diagnosis not present

## 2018-02-22 DIAGNOSIS — D649 Anemia, unspecified: Secondary | ICD-10-CM | POA: Diagnosis not present

## 2018-02-22 DIAGNOSIS — K2950 Unspecified chronic gastritis without bleeding: Secondary | ICD-10-CM | POA: Diagnosis not present

## 2018-02-22 DIAGNOSIS — R110 Nausea: Secondary | ICD-10-CM | POA: Diagnosis not present

## 2018-02-22 DIAGNOSIS — D509 Iron deficiency anemia, unspecified: Secondary | ICD-10-CM | POA: Diagnosis not present

## 2018-02-23 ENCOUNTER — Encounter: Payer: Self-pay | Admitting: Family Medicine

## 2018-02-23 NOTE — Progress Notes (Signed)
Subjective:  Patient ID: Sheena Morrow, female    DOB: 08-02-1933  Age: 85 y.o. MRN: [id number]  CC: Medical Management of Chronic Issues   HPI Sheena Morrow presents for follow up of her chronic pain. She takes methadone for long term post herpetic neuralgia. It is under adequate control as long as she doesn't miss doses.   Depression screen Sheena Morrow 02/19/2018 08/19/2017 04/26/2017  Decreased Interest 0 0 0  Down, Depressed, Hopeless 0 0 0  PHQ - 2 Score 0 0 0    History Sheena Morrow has a past medical history of Clotting disorder ([place]), Constipation due to opioid therapy, Old MI (myocardial infarction) (07/21/2015), Osteoporosis, Postherpetic neuralgia, and Systolic CHF with reduced left ventricular function, NYHA class 2 ([place]) (05/21/2015).   She has no past surgical history on file.   Her family history is not on file.She reports that she quit smoking about 9 years ago. Her smoking use included cigarettes. She started smoking about 48 years ago. She smoked 0.50 packs per day. She has never used smokeless tobacco. She reports that she does not drink alcohol or use drugs.    ROS Review of Systems  Constitutional: Negative.   HENT: Negative.   Eyes: Negative for visual disturbance.  Respiratory: Negative for shortness of breath.   Cardiovascular: Negative for chest pain.  Gastrointestinal: Negative for abdominal pain.  Musculoskeletal: Negative for arthralgias.    Objective:  BP 115/67   Pulse 83   Temp 97.6 F (36.4 C) (Oral)   Ht '5\' 1"'  (1.549 m)   Wt 132 lb 6 oz (60 kg)   BMI 25.01 kg/m   BP Readings from Last 3 Encounters:  02/19/18 115/67  11/19/17 (!) 98/58  08/19/17 (!) 100/55    Wt Readings from Last 3 Encounters:  02/19/18 132 lb 6 oz (60 kg)  11/19/17 134 lb 9.6 oz (61.1 kg)  08/19/17 140 lb 4 oz (63.6 kg)     Physical Exam Constitutional:      General: She is not in acute distress.    Appearance: She is well-developed. She is ill-appearing.  HENT:   Head: Normocephalic and atraumatic.     Right Ear: External ear normal.     Left Ear: External ear normal.     Nose: Nose normal.  Eyes:     Conjunctiva/sclera: Conjunctivae normal.     Pupils: Pupils are equal, round, and reactive to light.  Neck:     Musculoskeletal: Normal range of motion and neck supple.     Thyroid: No thyromegaly.  Cardiovascular:     Rate and Rhythm: Normal rate and regular rhythm.     Heart sounds: Normal heart sounds. No murmur.  Pulmonary:     Effort: Pulmonary effort is normal. No respiratory distress.     Breath sounds: Normal breath sounds. No wheezing or rales.  Abdominal:     General: Bowel sounds are normal. There is no distension.     Palpations: Abdomen is soft.     Tenderness: There is no abdominal tenderness.  Lymphadenopathy:     Cervical: No cervical adenopathy.  Skin:    General: Skin is warm and dry.  Neurological:     Mental Status: She is alert and oriented to person, place, and time.     Deep Tendon Reflexes: Reflexes are normal and symmetric.  Psychiatric:        Mood and Affect: Mood normal.    Results for orders placed or performed in visit on 02/19/18  CBC with Differential/Platelet  Result Value Ref Range   WBC 4.6 3.4 - 10.8 x10E3/uL   RBC 2.75 (L) 3.77 - 5.28 x10E6/uL   Hemoglobin 6.8 (LL) 11.1 - 15.9 g/dL   Hematocrit 24.5 (L) 34.0 - 46.6 %   MCV 89 79 - 97 fL   MCH 24.7 (L) 26.6 - 33.0 pg   MCHC 27.8 (L) 31.5 - 35.7 g/dL   RDW 18.2 (H) 11.7 - 15.4 %   Platelets 266 150 - 450 x10E3/uL   Neutrophils 48 Not Estab. %   Lymphs 37 Not Estab. %   Monocytes 10 Not Estab. %   Eos 4 Not Estab. %   Basos 1 Not Estab. %   Neutrophils Absolute 2.2 1.4 - 7.0 x10E3/uL   Lymphocytes Absolute 1.7 0.7 - 3.1 x10E3/uL   Monocytes Absolute 0.5 0.1 - 0.9 x10E3/uL   EOS (ABSOLUTE) 0.2 0.0 - 0.4 x10E3/uL   Basophils Absolute 0.1 0.0 - 0.2 x10E3/uL   Immature Granulocytes 0 Not Estab. %   Immature Grans (Abs) 0.0 0.0 - 0.1 x10E3/uL    CMP14+EGFR  Result Value Ref Range   Glucose 85 65 - 99 mg/dL   BUN 44 (H) 8 - 27 mg/dL   Creatinine, Ser 1.63 (H) 0.57 - 1.00 mg/dL   GFR calc non Af Amer 29 (L) >59 mL/min/1.73   GFR calc Af Amer 34 (L) >59 mL/min/1.73   BUN/Creatinine Ratio 27 12 - 28   Sodium 140 134 - 144 mmol/L   Potassium 4.5 3.5 - 5.2 mmol/L   Chloride 99 96 - 106 mmol/L   CO2 25 20 - 29 mmol/L   Calcium 9.4 8.7 - 10.3 mg/dL   Total Protein 7.1 6.0 - 8.5 g/dL   Albumin 4.1 3.6 - 4.6 g/dL   Globulin, Total 3.0 1.5 - 4.5 g/dL   Albumin/Globulin Ratio 1.4 1.2 - 2.2   Bilirubin Total <0.2 0.0 - 1.2 mg/dL   Alkaline Phosphatase 90 39 - 117 IU/L   AST 55 (H) 0 - 40 IU/L   ALT 25 0 - 32 IU/L  ]   Assessment & Plan:   [name] was seen today for medical management of chronic issues.  Diagnoses and all orders for this visit:  Postherpetic neuralgia -     CBC with Differential/Platelet -     CMP14+EGFR -     methadone (DOLOPHINE) 10 MG tablet; Take 1 tablet (10 mg total) by mouth 3 (three) times daily for 30 days. -     methadone (DOLOPHINE) 10 MG tablet; Take 1 tablet (10 mg total) by mouth 3 (three) times daily for 30 days. -     methadone (DOLOPHINE) 10 MG tablet; Take 1 tablet (10 mg total) by mouth 3 (three) times daily for 30 days.  Other orders -     atorvastatin (LIPITOR) 20 MG tablet; Take 1 tablet (20 mg total) by mouth daily. -     Ferrous Fumarate (HEMOCYTE - 106 MG FE) 324 (106 Fe) MG TABS tablet; Take 1 tablet (106 mg of iron total) by mouth 2 (two) times daily. For iron deficiency anemia -     furosemide (LASIX) 20 MG tablet; Take 1 tablet (20 mg total) by mouth daily. For swelling -     lisinopril (PRINIVIL,ZESTRIL) 5 MG tablet; Take 1 tablet (5 mg total) by mouth daily. -     meloxicam (MOBIC) 15 MG tablet; TAKE 1 TABLET BY MOUTH EVERY DAY WITH FOOD FOR BACK PAIN -  metoprolol succinate (TOPROL-XL) 25 MG 24 hr tablet; Take 1 tablet (25 mg total) by mouth daily. -     montelukast  (SINGULAIR) 10 MG tablet; TAKE 1 TABLET BY MOUTH EVERYDAY AT BEDTIME -     Rivaroxaban (XARELTO) 15 MG TABS tablet; Take 1 tablet (15 mg total) by mouth daily.       I have discontinued Sheena Morrow. I have changed her XARELTO to Rivaroxaban. I have also changed her methadone, methadone, methadone, atorvastatin, lisinopril, and metoprolol succinate. Additionally, I am having her maintain her nitroGLYCERIN, triamcinolone cream, polyethylene glycol powder, omeprazole, linaclotide, gabapentin, denosumab, Ferrous Fumarate, furosemide, meloxicam, and montelukast.  Allergies as of 02/19/2018   No Known Allergies     Medication List       Accurate as of February 19, 2018 11:59 PM. Always use your most recent med list.        atorvastatin 20 MG tablet Commonly known as:  LIPITOR Take 1 tablet (20 mg total) by mouth daily.   denosumab 60 MG/ML Sosy injection Commonly known as:  PROLIA Inject into the skin.   Ferrous Fumarate 324 (106 Fe) MG Tabs tablet Commonly known as:  HEMOCYTE - 106 mg FE Take 1 tablet (106 mg of iron total) by mouth 2 (two) times daily. For iron deficiency anemia   furosemide 20 MG tablet Commonly known as:  LASIX Take 1 tablet (20 mg total) by mouth daily. For swelling   gabapentin 800 MG tablet Commonly known as:  NEURONTIN TAKE 1 TABLET EACH MORNING,TAKE 1 TABLET EACH AFTERNOON & 2 TABLETS EACH EVENING   linaclotide 290 MCG Caps capsule Commonly known as:  LINZESS Take 1 capsule (290 mcg total) by mouth daily. To regulate bowel movements   lisinopril 5 MG tablet Commonly known as:  PRINIVIL,ZESTRIL Take 1 tablet (5 mg total) by mouth daily.   meloxicam 15 MG tablet Commonly known as:  MOBIC TAKE 1 TABLET BY MOUTH EVERY DAY WITH FOOD FOR BACK PAIN   methadone 10 MG tablet Commonly known as:  DOLOPHINE Take 1 tablet (10 mg total) by mouth 3 (three) times daily for 30 days.   methadone 10 MG tablet Commonly known as:  DOLOPHINE Take  1 tablet (10 mg total) by mouth 3 (three) times daily for 30 days. Start taking on:  March 21, 2018   methadone 10 MG tablet Commonly known as:  DOLOPHINE Take 1 tablet (10 mg total) by mouth 3 (three) times daily for 30 days. Start taking on:  April 20, 2018   metoprolol succinate 25 MG 24 hr tablet Commonly known as:  TOPROL-XL Take 1 tablet (25 mg total) by mouth daily.   montelukast 10 MG tablet Commonly known as:  SINGULAIR TAKE 1 TABLET BY MOUTH EVERYDAY AT BEDTIME   nitroGLYCERIN 0.4 MG SL tablet Commonly known as:  NITROSTAT Place under the tongue.   omeprazole 20 MG capsule Commonly known as:  PRILOSEC TAKE 1 CAPSULE BY MOUTH EVERY DAY   polyethylene glycol powder powder Commonly known as:  GLYCOLAX/MIRALAX USE 1 DOSING CUP ORALLY IN 8 OZ. OF WATER DAILY   Rivaroxaban 15 MG Tabs tablet Commonly known as:  XARELTO Take 1 tablet (15 mg total) by mouth daily.   triamcinolone cream 0.1 % Commonly known as:  KENALOG APPLY TOPICALLY 2 (TWO) TIMES DAILY. TO THE SCALP AS NEEDED      Results of tests received. One day after office visit. Pt. referred to E.D. due to severe anemia.  Follow-up: Return  in about 3 months (around 05/21/2018).  Claretta Fraise, M.D.

## 2018-02-24 MED ORDER — ATORVASTATIN CALCIUM 20 MG PO TABS
20.00 | ORAL_TABLET | ORAL | Status: DC
Start: 2018-02-22 — End: 2018-02-24

## 2018-02-24 MED ORDER — SODIUM CHLORIDE 0.9 % IV SOLN
10.00 | INTRAVENOUS | Status: DC
Start: ? — End: 2018-02-24

## 2018-02-24 MED ORDER — GENERIC EXTERNAL MEDICATION
650.00 | Status: DC
Start: ? — End: 2018-02-24

## 2018-02-24 MED ORDER — GABAPENTIN 400 MG PO CAPS
800.00 | ORAL_CAPSULE | ORAL | Status: DC
Start: 2018-02-23 — End: 2018-02-24

## 2018-02-24 MED ORDER — LINACLOTIDE 145 MCG PO CAPS
145.00 | ORAL_CAPSULE | ORAL | Status: DC
Start: 2018-02-23 — End: 2018-02-24

## 2018-02-24 MED ORDER — ALUM & MAG HYDROXIDE-SIMETH 200-200-20 MG/5ML PO SUSP
30.00 | ORAL | Status: DC
Start: ? — End: 2018-02-24

## 2018-02-24 MED ORDER — FERROUS SULFATE 325 (65 FE) MG PO TABS
325.00 | ORAL_TABLET | ORAL | Status: DC
Start: 2018-02-23 — End: 2018-02-24

## 2018-02-24 MED ORDER — PANTOPRAZOLE SODIUM 40 MG PO TBEC
40.00 | DELAYED_RELEASE_TABLET | ORAL | Status: DC
Start: 2018-02-22 — End: 2018-02-24

## 2018-02-24 MED ORDER — ALBUTEROL SULFATE (2.5 MG/3ML) 0.083% IN NEBU
2.50 | INHALATION_SOLUTION | RESPIRATORY_TRACT | Status: DC
Start: ? — End: 2018-02-24

## 2018-02-24 MED ORDER — GENERIC EXTERNAL MEDICATION
4.00 | Status: DC
Start: ? — End: 2018-02-24

## 2018-02-24 MED ORDER — POLYETHYLENE GLYCOL 3350 17 G PO PACK
17.00 | PACK | ORAL | Status: DC
Start: 2018-02-22 — End: 2018-02-24

## 2018-02-24 MED ORDER — METOPROLOL SUCCINATE ER 25 MG PO TB24
25.00 | ORAL_TABLET | ORAL | Status: DC
Start: 2018-02-23 — End: 2018-02-24

## 2018-02-24 MED ORDER — GENERIC EXTERNAL MEDICATION
10.00 | Status: DC
Start: ? — End: 2018-02-24

## 2018-02-24 MED ORDER — NITROGLYCERIN 0.4 MG SL SUBL
.40 | SUBLINGUAL_TABLET | SUBLINGUAL | Status: DC
Start: ? — End: 2018-02-24

## 2018-02-24 MED ORDER — SENNA-DOCUSATE SODIUM 8.6-50 MG PO TABS
1.00 | ORAL_TABLET | ORAL | Status: DC
Start: ? — End: 2018-02-24

## 2018-02-24 MED ORDER — FUROSEMIDE 40 MG PO TABS
40.00 | ORAL_TABLET | ORAL | Status: DC
Start: 2018-02-23 — End: 2018-02-24

## 2018-02-24 MED ORDER — METHADONE HCL 10 MG PO TABS
10.00 | ORAL_TABLET | ORAL | Status: DC
Start: 2018-02-22 — End: 2018-02-24

## 2018-02-24 MED ORDER — GABAPENTIN 400 MG PO CAPS
1600.00 | ORAL_CAPSULE | ORAL | Status: DC
Start: 2018-02-22 — End: 2018-02-24

## 2018-02-25 ENCOUNTER — Ambulatory Visit (INDEPENDENT_AMBULATORY_CARE_PROVIDER_SITE_OTHER): Payer: Medicare HMO | Admitting: Family Medicine

## 2018-02-25 ENCOUNTER — Encounter: Payer: Self-pay | Admitting: Family Medicine

## 2018-02-25 VITALS — BP 101/67 | HR 72 | Temp 98.1°F | Ht 61.0 in | Wt 129.0 lb

## 2018-02-25 DIAGNOSIS — D649 Anemia, unspecified: Secondary | ICD-10-CM

## 2018-02-25 LAB — HEMOGLOBIN, FINGERSTICK: Hemoglobin: 9.9 g/dL — ABNORMAL LOW (ref 11.1–15.9)

## 2018-02-25 MED ORDER — PANTOPRAZOLE SODIUM 40 MG PO TBEC: 40.0000 mg | DELAYED_RELEASE_TABLET | Freq: Two times a day (BID) | ORAL | 2 refills | Status: DC

## 2018-02-25 NOTE — Patient Instructions (Signed)
Resume Xarelto tomorrow. Get your blood drawn in 2 weeks at [facility] in [place] [address]

## 2018-02-25 NOTE — Progress Notes (Signed)
Subjective:  Patient ID: Sheena Morrow, female    DOB: January 22, 1934  Age: 85 y.o. MRN: [id number]  CC: Hospitalization Follow-up   HPI Sheena Morrow presents for recheck of her anemia.  She was found to have a hemoglobin of 6 approximately on her routine blood work last week and was sent to [facility].  While there she received 2 red blood cell transfusions and is back today for recheck.  The goal is to determine the source of the anemia.  She has been on blood thinners.  This has been due to atrial fibrillation.  She denies any hematochezia and melena.  However she admits that she does not observe for these things regularly.  She did undergo endoscopy on the 25th.  That showed patchy erythematous mucosa with erosions.  To cold biopsy reports were performed and remain pending.  She underwent colonoscopy as well and 3 sessile adenomatous polyps from 5 to 15 mm in size were removed.  A single large angiectasia without bleeding was also noted.  This was treated with induced coagulation using argon.  Depression screen [facility] 2/9 02/25/2018 02/19/2018 08/19/2017  Decreased Interest 0 0 0  Down, Depressed, Hopeless 0 0 0  PHQ - 2 Score 0 0 0    History Sheena Morrow has a past medical history of Clotting disorder ([place]), Constipation due to opioid therapy, Old MI (myocardial infarction) (07/21/2015), Osteoporosis, Postherpetic neuralgia, and Systolic CHF with reduced left ventricular function, NYHA class 2 ([place]) (05/21/2015).   She has no past surgical history on file.   Her family history is not on file.She reports that she quit smoking about 9 years ago. Her smoking use included cigarettes. She started smoking about 48 years ago. She smoked 0.50 packs per day. She has never used smokeless tobacco. She reports that she does not drink alcohol or use drugs.    ROS Review of Systems  Constitutional: Negative.   HENT: Negative.   Eyes: Negative for visual disturbance.  Respiratory: Negative for shortness of  breath.   Cardiovascular: Negative for chest pain.  Gastrointestinal: Negative for abdominal pain, anal bleeding, blood in stool, constipation, diarrhea, nausea and vomiting.  Musculoskeletal: Negative for arthralgias.    Objective:  BP 101/67   Pulse 72   Temp 98.1 F (36.7 C) (Oral)   Ht 5\' 1"  (1.549 m)   Wt 129 lb (58.5 kg)   BMI 24.37 kg/m   BP Readings from Last 3 Encounters:  02/25/18 101/67  02/19/18 115/67  11/19/17 (!) 98/58    Wt Readings from Last 3 Encounters:  02/25/18 129 lb (58.5 kg)  02/19/18 132 lb 6 oz (60 kg)  11/19/17 134 lb 9.6 oz (61.1 kg)     Physical Exam Constitutional:      General: She is not in acute distress.    Appearance: She is well-developed.  HENT:     Head: Normocephalic and atraumatic.  Eyes:     Conjunctiva/sclera: Conjunctivae normal.     Pupils: Pupils are equal, round, and reactive to light.  Neck:     Musculoskeletal: Normal range of motion and neck supple.     Thyroid: No thyromegaly.  Cardiovascular:     Rate and Rhythm: Normal rate and regular rhythm.     Heart sounds: Normal heart sounds. No murmur.  Pulmonary:     Effort: Pulmonary effort is normal. No respiratory distress.     Breath sounds: Normal breath sounds. No wheezing or rales.  Abdominal:     General: Bowel  sounds are normal. There is no distension.     Palpations: Abdomen is soft.     Tenderness: There is no abdominal tenderness.  Musculoskeletal: Normal range of motion.  Lymphadenopathy:     Cervical: No cervical adenopathy.  Skin:    General: Skin is warm and dry.  Neurological:     Mental Status: She is alert and oriented to person, place, and time.  Psychiatric:        Behavior: Behavior normal.        Thought Content: Thought content normal.        Judgment: Judgment normal.    Results for orders placed or performed in visit on 02/25/18  Hemoglobin, fingerstick  Result Value Ref Range   Hemoglobin 9.9 (L) 11.1 - 15.9 g/dL     Assessment  & Plan:   Sheena Morrow was seen today for hospitalization follow-up.  Diagnoses and all orders for this visit:  Anemia, unspecified type -     Hemoglobin, fingerstick  Other orders -     pantoprazole (PROTONIX) 40 MG tablet; Take 1 tablet (40 mg total) by mouth 2 (two) times daily.   It appears that there is a GI source of her bleeding.  Therefore pantoprazole should be useful.  Will monitor hemoglobin closely.  She should get that rechecked through lab core in about a week.    I have discontinued Chaney Malling omeprazole, meloxicam, and ferrous sulfate. I have also changed her pantoprazole. Additionally, I am having her maintain her nitroGLYCERIN, triamcinolone cream, polyethylene glycol powder, linaclotide, gabapentin, denosumab, methadone, methadone, methadone, atorvastatin, Ferrous Fumarate, furosemide, lisinopril, metoprolol succinate, montelukast, and Rivaroxaban.  Allergies as of 02/25/2018      Reactions   Nsaids Other (See Comments)   GI Bleed      Medication List       Accurate as of February 25, 2018 11:59 PM. Always use your most recent med list.        atorvastatin 20 MG tablet Commonly known as:  LIPITOR Take 1 tablet (20 mg total) by mouth daily.   denosumab 60 MG/ML Sosy injection Commonly known as:  PROLIA Inject into the skin.   Ferrous Fumarate 324 (106 Fe) MG Tabs tablet Commonly known as:  HEMOCYTE - 106 mg FE Take 1 tablet (106 mg of iron total) by mouth 2 (two) times daily. For iron deficiency anemia   furosemide 20 MG tablet Commonly known as:  LASIX Take 1 tablet (20 mg total) by mouth daily. For swelling   gabapentin 800 MG tablet Commonly known as:  NEURONTIN TAKE 1 TABLET EACH MORNING,TAKE 1 TABLET EACH AFTERNOON & 2 TABLETS EACH EVENING   linaclotide 290 MCG Caps capsule Commonly known as:  LINZESS Take 1 capsule (290 mcg total) by mouth daily. To regulate bowel movements   lisinopril 5 MG tablet Commonly known as:   PRINIVIL,ZESTRIL Take 1 tablet (5 mg total) by mouth daily.   methadone 10 MG tablet Commonly known as:  DOLOPHINE Take 1 tablet (10 mg total) by mouth 3 (three) times daily for 30 days.   methadone 10 MG tablet Commonly known as:  DOLOPHINE Take 1 tablet (10 mg total) by mouth 3 (three) times daily for 30 days. Start taking on:  March 21, 2018   methadone 10 MG tablet Commonly known as:  DOLOPHINE Take 1 tablet (10 mg total) by mouth 3 (three) times daily for 30 days. Start taking on:  April 20, 2018   metoprolol succinate 25 MG 24 hr  tablet Commonly known as:  TOPROL-XL Take 1 tablet (25 mg total) by mouth daily.   montelukast 10 MG tablet Commonly known as:  SINGULAIR TAKE 1 TABLET BY MOUTH EVERYDAY AT BEDTIME   nitroGLYCERIN 0.4 MG SL tablet Commonly known as:  NITROSTAT Place under the tongue.   pantoprazole 40 MG tablet Commonly known as:  PROTONIX Take 1 tablet (40 mg total) by mouth 2 (two) times daily.   polyethylene glycol powder powder Commonly known as:  GLYCOLAX/MIRALAX USE 1 DOSING CUP ORALLY IN 8 OZ. OF WATER DAILY   Rivaroxaban 15 MG Tabs tablet Commonly known as:  XARELTO Take 1 tablet (15 mg total) by mouth daily.   triamcinolone cream 0.1 % Commonly known as:  KENALOG APPLY TOPICALLY 2 (TWO) TIMES DAILY. TO THE SCALP AS NEEDED        Follow-up: Return in about 1 month (around 03/28/2018), or if symptoms worsen or fail to improve.  Claretta Fraise, M.D.

## 2018-03-03 ENCOUNTER — Encounter: Payer: Self-pay | Admitting: Family Medicine

## 2018-03-13 ENCOUNTER — Telehealth: Payer: Self-pay | Admitting: *Deleted

## 2018-03-13 ENCOUNTER — Telehealth: Payer: Self-pay | Admitting: Family Medicine

## 2018-03-13 DIAGNOSIS — D649 Anemia, unspecified: Secondary | ICD-10-CM

## 2018-03-13 NOTE — Telephone Encounter (Signed)
Triage ordered a CBC.

## 2018-03-13 NOTE — Telephone Encounter (Signed)
Pt called needing order for repeat CBC per Dr Livia Snellen Order entered in Epic and faxed to [facility]  [phone] per pt request

## 2018-03-17 DIAGNOSIS — D649 Anemia, unspecified: Secondary | ICD-10-CM | POA: Diagnosis not present

## 2018-04-21 ENCOUNTER — Telehealth: Payer: Self-pay | Admitting: Family Medicine

## 2018-04-21 ENCOUNTER — Other Ambulatory Visit: Payer: Self-pay | Admitting: Family Medicine

## 2018-04-21 MED ORDER — ESOMEPRAZOLE MAGNESIUM 40 MG PO CPDR: 40.0000 mg | DELAYED_RELEASE_CAPSULE | Freq: Every day | ORAL | 5 refills | Status: DC

## 2018-04-21 NOTE — Telephone Encounter (Signed)
Patient notified and verbalized understanding. 

## 2018-04-21 NOTE — Telephone Encounter (Signed)
DC pantoprazole. If having reflux still, I sent in Nexium.

## 2018-05-13 ENCOUNTER — Other Ambulatory Visit: Payer: Self-pay | Admitting: Family Medicine

## 2018-05-20 ENCOUNTER — Other Ambulatory Visit: Payer: Self-pay

## 2018-05-20 ENCOUNTER — Encounter: Payer: Self-pay | Admitting: Family Medicine

## 2018-05-20 ENCOUNTER — Ambulatory Visit (INDEPENDENT_AMBULATORY_CARE_PROVIDER_SITE_OTHER): Payer: Medicare HMO | Admitting: Family Medicine

## 2018-05-20 ENCOUNTER — Ambulatory Visit: Payer: Medicare HMO | Admitting: Family Medicine

## 2018-05-20 DIAGNOSIS — I480 Paroxysmal atrial fibrillation: Secondary | ICD-10-CM | POA: Diagnosis not present

## 2018-05-20 DIAGNOSIS — B0229 Other postherpetic nervous system involvement: Secondary | ICD-10-CM

## 2018-05-20 MED ORDER — METHADONE HCL 10 MG PO TABS: 10.0000 mg | ORAL_TABLET | Freq: Three times a day (TID) | ORAL | 0 refills | Status: DC

## 2018-05-20 NOTE — Progress Notes (Signed)
Subjective:    Patient ID: Sheena Morrow, female    DOB: 08/29/1933, 85 y.o.   MRN: [id number]  CC: Atrial Fibrillation and Pain   HPI: Sheena Morrow is a 85 y.o. female presenting for Atrial Fibrillation and Pain  Patient in for follow-up of atrial fibrillation. Patient denies any recent bouts of chest pain or palpitations. Additionally, patient is taking anticoagulants. Patient denies any recent excessive bleeding episodes including epistaxis, bleeding from the gums, genitalia, rectal bleeding or hematuria. Additionally there has been no excessive bruising.  Patient has also been evaluated for heart failure in the past no symptoms recently.  She does continue to take her beta-blocker and ACE inhibitor for prophylaxis.  Pain assessment: Cause of pain- post herpetic neuralgia Pain location- mid back Pain on scale of 1-10- 2 when taking med. Never lets it run out. Several months ago it did and pain wen to 8-10/10 Frequency- constant What increases pain-missing meds What makes pain Better-nothing other than  Effects on ADL - none Any change in general medical condition-none  Current opioids rx- methadone 10 mg TID - this hasn't chnaged in over a decade Effectiveness of current meds-good Adverse reactions form pain meds-constipation Morphine equivalent- 90  Pill count performed-No Last drug screen - 11/19/2017 Urine drug screen today- No Was the [facility] reviewed- yes  If yes were their any concerning findings? - no  No flowsheet data found.   Pain contract signed on:   Depression screen [facility] 2/9 02/25/2018 02/19/2018 08/19/2017 04/26/2017 01/15/2017  Decreased Interest 0 0 0 0 0  Down, Depressed, Hopeless 0 0 0 0 0  PHQ - 2 Score 0 0 0 0 0     Relevant past medical, surgical, family and social history reviewed and updated as indicated.  Interim medical history since our last visit reviewed. Allergies and medications reviewed and updated.  ROS:  Review of Systems   Constitutional: Negative.   HENT: Negative for congestion.   Eyes: Negative for visual disturbance.  Respiratory: Negative for shortness of breath.   Cardiovascular: Negative for chest pain.  Gastrointestinal: Negative for abdominal pain, constipation, diarrhea, nausea and vomiting.  Genitourinary: Negative for difficulty urinating.  Musculoskeletal: Negative for arthralgias and myalgias.  Neurological: Negative for headaches.  Psychiatric/Behavioral: Negative for sleep disturbance.     Social History   Tobacco Use  Smoking Status Former Smoker  . Packs/day: 0.50  . Types: Cigarettes  . Start date: 04/01/1969  . Last attempt to quit: 04/01/2008  . Years since quitting: 10.1  Smokeless Tobacco Never Used       Objective:     Wt Readings from Last 3 Encounters:  02/25/18 129 lb (58.5 kg)  02/19/18 132 lb 6 oz (60 kg)  11/19/17 134 lb 9.6 oz (61.1 kg)     Exam deferred. Pt. Harboring due to COVID 19. Phone visit performed.   Assessment & Plan:   1. Paroxysmal atrial fibrillation (HCC)   2. Postherpetic neuralgia     Meds ordered this encounter  Medications  . methadone (DOLOPHINE) 10 MG tablet    Sig: Take 1 tablet (10 mg total) by mouth 3 (three) times daily for 30 days.    Dispense:  90 tablet    Refill:  0  . methadone (DOLOPHINE) 10 MG tablet    Sig: Take 1 tablet (10 mg total) by mouth 3 (three) times daily for 30 days.    Dispense:  90 tablet    Refill:  0  . methadone (DOLOPHINE)  10 MG tablet    Sig: Take 1 tablet (10 mg total) by mouth 3 (three) times daily for 30 days.    Dispense:  90 tablet    Refill:  0    No orders of the defined types were placed in this encounter.     Sheena Morrow was seen today for atrial fibrillation and pain.  Diagnoses and all orders for this visit:  Paroxysmal atrial fibrillation (HCC)  Postherpetic neuralgia -     methadone (DOLOPHINE) 10 MG tablet; Take 1 tablet (10 mg total) by mouth 3 (three) times daily for 30 days. -      methadone (DOLOPHINE) 10 MG tablet; Take 1 tablet (10 mg total) by mouth 3 (three) times daily for 30 days. -     methadone (DOLOPHINE) 10 MG tablet; Take 1 tablet (10 mg total) by mouth 3 (three) times daily for 30 days.    Virtual Visit via telephone Note  I discussed the limitations, risks, security and privacy concerns of performing an evaluation and management service by telephone and the availability of in person appointments. The patient was identified with two identifiers. Pt.expressed understanding and agreed to proceed. Pt. Is at home. Dr. Livia Snellen is in his home office.  Follow Up Instructions:   I discussed the assessment and treatment plan with the patient. The patient was provided an opportunity to ask questions and all were answered. The patient agreed with the plan and demonstrated an understanding of the instructions.   The patient was advised to call back or seek an in-person evaluation if the symptoms worsen or if the condition fails to improve as anticipated.  Visit started: 2:40 Call ended:  3:13 Total minutes including chart review and phone contact time: 37   Follow up plan: Return in about 3 months (around 08/19/2018).  Claretta Fraise, [facility] 05/20/2018, 3:18 PM

## 2018-05-28 ENCOUNTER — Other Ambulatory Visit: Payer: Self-pay | Admitting: Family Medicine

## 2018-06-19 ENCOUNTER — Other Ambulatory Visit: Payer: Self-pay

## 2018-06-19 ENCOUNTER — Ambulatory Visit (INDEPENDENT_AMBULATORY_CARE_PROVIDER_SITE_OTHER): Payer: Medicare HMO | Admitting: *Deleted

## 2018-06-19 VITALS — Ht 61.0 in | Wt 129.0 lb

## 2018-06-19 DIAGNOSIS — Z0000 Encounter for general adult medical examination without abnormal findings: Secondary | ICD-10-CM | POA: Diagnosis not present

## 2018-06-19 NOTE — Progress Notes (Signed)
MEDICARE ANNUAL WELLNESS VISIT  06/19/2018  Telephone Visit Disclaimer This Medicare AWV was conducted by telephone due to national recommendations for restrictions regarding the COVID-19 Pandemic (e.g. social distancing).  I verified, using two identifiers, that I am speaking with Sheena Morrow or their authorized healthcare agent. I discussed the limitations, risks, security, and privacy concerns of performing an evaluation and management service by telephone and the potential availability of an in-person appointment in the future. The patient expressed understanding and agreed to proceed.   Subjective:  Sheena Morrow is a 85 y.o. female patient of Stacks, Cletus Gash, MD who had a Medicare Annual Wellness Visit today via telephone. Medha is Retired and lives alone. she has  1 child. she reports that she is socially active and does interact with friends/family regularly. she is not physically active and enjoys reading.  Patient Care Team: Claretta Fraise, MD as PCP - General (Family Medicine)  Advanced Directives 06/19/2018  Does Patient Have a Medical Advance Directive? No  Would patient like information on creating a medical advance directive? Yes (MAU/Ambulatory/Procedural Areas - Information given)    Hospital Utilization Over the Past 12 Months: # of hospitalizations or ER visits: 0 # of surgeries: 0  Review of Systems    Patient reports that her overall health is unchanged compared to last year.  Patient Reported Readings (BP, Pulse, CBG, Weight, etc) none  Review of Systems: History obtained from chart review and the patient General ROS: negative  All other systems negative.  Pain Assessment Pain : No/denies pain     Current Medications & Allergies (verified) Allergies as of 06/19/2018      Reactions   Nsaids Other (See Comments)   GI Bleed      Medication List       Accurate as of Jun 19, 2018  4:10 PM. If you have any questions, ask your nurse or doctor.         atorvastatin 20 MG tablet Commonly known as:  LIPITOR Take 1 tablet (20 mg total) by mouth daily.   denosumab 60 MG/ML Sosy injection Commonly known as:  PROLIA Inject into the skin.   esomeprazole 40 MG capsule Commonly known as:  NexIUM Take 1 capsule (40 mg total) by mouth daily.   Ferrous Fumarate 324 (106 Fe) MG Tabs tablet Commonly known as:  HEMOCYTE - 106 mg FE Take 1 tablet (106 mg of iron total) by mouth 2 (two) times daily. For iron deficiency anemia   furosemide 20 MG tablet Commonly known as:  LASIX Take 1 tablet (20 mg total) by mouth daily. For swelling   gabapentin 800 MG tablet Commonly known as:  NEURONTIN TAKE 1 TABLET EACH MORNING,TAKE 1 TABLET EACH AFTERNOON & 2 TABLETS EACH EVENING   linaclotide 290 MCG Caps capsule Commonly known as:  Linzess Take 1 capsule (290 mcg total) by mouth daily. To regulate bowel movements   lisinopril 5 MG tablet Commonly known as:  ZESTRIL Take 1 tablet (5 mg total) by mouth daily.   methadone 10 MG tablet Commonly known as:  Dolophine Take 1 tablet (10 mg total) by mouth 3 (three) times daily for 30 days.   methadone 10 MG tablet Commonly known as:  Dolophine Take 1 tablet (10 mg total) by mouth 3 (three) times daily for 30 days.   methadone 10 MG tablet Commonly known as:  Dolophine Take 1 tablet (10 mg total) by mouth 3 (three) times daily for 30 days. Start taking on:  August 18, 2018   metoprolol succinate 25 MG 24 hr tablet Commonly known as:  TOPROL-XL Take 1 tablet (25 mg total) by mouth daily.   montelukast 10 MG tablet Commonly known as:  SINGULAIR TAKE 1 TABLET BY MOUTH EVERYDAY AT BEDTIME   nitroGLYCERIN 0.4 MG SL tablet Commonly known as:  NITROSTAT Place under the tongue.   polyethylene glycol powder 17 GM/SCOOP powder Commonly known as:  GLYCOLAX/MIRALAX USE 1 DOSING CUP ORALLY IN 8 OZ. OF WATER DAILY   Rivaroxaban 15 MG Tabs tablet Commonly known as:  Xarelto Take 1 tablet (15 mg  total) by mouth daily.   triamcinolone cream 0.1 % Commonly known as:  KENALOG APPLY TOPICALLY 2 (TWO) TIMES DAILY. TO THE SCALP AS NEEDED       History (reviewed): Past Medical History:  Diagnosis Date  . Clotting disorder ([place])   . Constipation due to opioid therapy   . Old MI (myocardial infarction) 07/21/2015  . Osteoporosis   . Postherpetic neuralgia   . Systolic CHF with reduced left ventricular function, NYHA class 2 ([place]) 05/21/2015   No past surgical history on file. No family history on file. Social History   Socioeconomic History  . Marital status: Divorced    Spouse name: Not on file  . Number of children: 1  . Years of education: Not on file  . Highest education level: 11th grade  Occupational History  . Occupation: Retired  Scientific laboratory technician  . Financial resource strain: Not hard at all  . Food insecurity:    Worry: Never true    Inability: Never true  . Transportation needs:    Medical: No    Non-medical: No  Tobacco Use  . Smoking status: Former Smoker    Packs/day: 0.50    Types: Cigarettes    Start date: 04/01/1969    Last attempt to quit: 04/01/2008    Years since quitting: 10.2  . Smokeless tobacco: Never Used  Substance and Sexual Activity  . Alcohol use: No    Alcohol/week: 0.0 standard drinks  . Drug use: No  . Sexual activity: Not Currently  Lifestyle  . Physical activity:    Days per week: 0 days    Minutes per session: 0 min  . Stress: Not at all  Relationships  . Social connections:    Talks on phone: Once a week    Gets together: Once a week    Attends religious service: Never    Active member of club or organization: No    Attends meetings of clubs or organizations: Never    Relationship status: Divorced  Other Topics Concern  . Not on file  Social History Narrative  . Not on file    Activities of Daily Living In your present state of health, do you have any difficulty performing the following activities: 06/19/2018  Hearing? N   Vision? N  Difficulty concentrating or making decisions? N  Walking or climbing stairs? Y  Dressing or bathing? N  Doing errands, shopping? N  Preparing Food and eating ? N  Using the Toilet? N  In the past six months, have you accidently leaked urine? N  Do you have problems with loss of bowel control? N  Managing your Medications? N  Managing your Finances? N  Housekeeping or managing your Housekeeping? N  Some recent data might be hidden    Patient Literacy How often do you need to have someone help you when you read instructions, pamphlets, or other written  materials from your doctor or pharmacy?: 1 - Never What is the last grade level you completed in school?: 11th Grade  Exercise Exercise limited by: None identified  Diet Patient reports consuming 2 meals a day and 1 snack(s) a day Patient reports that her primary diet is: Regular Patient reports that she does have regular access to food.   Depression Screen PHQ 2/9 Scores 06/19/2018 02/25/2018 02/19/2018 08/19/2017 04/26/2017 01/15/2017 10/15/2016  PHQ - 2 Score 0 0 0 0 0 0 0     Fall Risk Fall Risk  06/19/2018 02/25/2018 02/19/2018 08/19/2017 04/26/2017  Falls in the past year? 0 0 0 No No     Objective:  Keren Alverio seemed alert and oriented and she participated appropriately during our telephone visit.  Blood Pressure Weight BMI  BP Readings from Last 3 Encounters:  02/25/18 101/67  02/19/18 115/67  11/19/17 (!) 98/58   Wt Readings from Last 3 Encounters:  06/19/18 129 lb (58.5 kg)  02/25/18 129 lb (58.5 kg)  02/19/18 132 lb 6 oz (60 kg)   BMI Readings from Last 1 Encounters:  06/19/18 24.37 kg/m    *Unable to obtain current vital signs, weight, and BMI due to telephone visit type  Hearing/Vision  . Lilli did not seem to have difficulty with hearing/understanding during the telephone conversation . Reports that she has had a formal eye exam by an eye care professional within the past year . Reports that  she has not had a formal hearing evaluation within the past year *Unable to fully assess hearing and vision during telephone visit type  Cognitive Function: 6CIT Screen 06/19/2018  What Year? 0 points  What month? 0 points  What time? 0 points  Count back from 20 0 points  Months in reverse 0 points  Repeat phrase 0 points  Total Score 0    Normal Cognitive Function Screening: Yes (Normal:0-7, Significant for Dysfunction: >8)  Immunization & Health Maintenance Record Immunization History  Administered Date(s) Administered  . Influenza, High Dose Seasonal PF 10/15/2016, 11/19/2017  . Influenza,inj,Quad PF,6+ Mos 01/04/2015, 11/03/2015  . Zoster Recombinat (Shingrix) 01/15/2017    Health Maintenance  Topic Date Due  . DEXA SCAN  06/19/2019 (Originally 04/10/2016)  . TETANUS/TDAP  06/19/2019 (Originally 05/04/1952)  . PNA vac Low Risk Adult (1 of 2 - PCV13) 06/19/2019 (Originally 05/03/2000)  . INFLUENZA VACCINE  08/30/2018       Assessment  This is a routine wellness examination for Sheena Morrow.  Health Maintenance: Due or Overdue There are no preventive care reminders to display for this patient.  Sheena Morrow does not need a referral for Community Assistance: Care Management:   no Social Work:    no Prescription Assistance:  no Nutrition/Diabetes Education:  no   Plan:  Personalized Goals Goals Addressed            This Visit's Progress   . Exercise 3x per week (30 min per time)      . Have 3 meals a day        Personalized Health Maintenance & Screening Recommendations  Pneumococcal vaccine  Td vaccine Bone densitometry screening  Lung Cancer Screening Recommended: no (Low Dose CT Chest recommended if Age 50-80 years, 30 pack-year currently smoking OR have quit w/in past 15 years) Hepatitis C Screening recommended: no HIV Screening recommended: no  Advanced Directives: Written information was prepared per patient's request.  Referrals & Orders No  orders of the defined types were placed in this encounter.  Follow-up Plan . Follow-up with Claretta Fraise, MD as planned    I have personally reviewed and noted the following in the patient's chart:   . Medical and social history . Use of alcohol, tobacco or illicit drugs  . Current medications and supplements . Functional ability and status . Nutritional status . Physical activity . Advanced directives . List of other physicians . Hospitalizations, surgeries, and ER visits in previous 12 months . Vitals . Screenings to include cognitive, depression, and falls . Referrals and appointments  In addition, I have reviewed and discussed with Sheena Morrow certain preventive protocols, quality metrics, and best practice recommendations. A written personalized care plan for preventive services as well as general preventive health recommendations is available and can be mailed to the patient at her request.      Truett Mainland, LPN signature  [id number]

## 2018-06-19 NOTE — Patient Instructions (Signed)
[name] , Thank you for taking time to come for your Medicare Wellness Visit. I appreciate your ongoing commitment to your health goals. Please review the following plan we discussed and let me know if I can assist you in the future.   These are the goals we discussed: Goals    . Exercise 3x per week (30 min per time)    . Have 3 meals a day       This is a list of the screening recommended for you and due dates:  Health Maintenance  Topic Date Due  . DEXA scan (bone density measurement)  06/19/2019*  . Tetanus Vaccine  06/19/2019*  . Pneumonia vaccines (1 of 2 - PCV13) 06/19/2019*  . Flu Shot  08/30/2018  *Topic was postponed. The date shown is not the original due date.     Advance Directive  Advance directives are legal documents that let you make choices ahead of time about your health care and medical treatment in case you become unable to communicate for yourself. Advance directives are a way for you to communicate your wishes to family, friends, and health care providers. This can help convey your decisions about end-of-life care if you become unable to communicate. Discussing and writing advance directives should happen over time rather than all at once. Advance directives can be changed depending on your situation and what you want, even after you have signed the advance directives. If you do not have an advance directive, some states assign family decision makers to act on your behalf based on how closely you are related to them. Each state has its own laws regarding advance directives. You may want to check with your health care provider, attorney, or state representative about the laws in your state. There are different types of advance directives, such as:  Medical power of attorney.  Living will.  Do not resuscitate (DNR) or do not attempt resuscitation (DNAR) order. Health care proxy and medical power of attorney A health care proxy, also called a health care  agent, is a person who is appointed to make medical decisions for you in cases in which you are unable to make the decisions yourself. Generally, people choose someone they know well and trust to represent their preferences. Make sure to ask this person for an agreement to act as your proxy. A proxy may have to exercise judgment in the event of a medical decision for which your wishes are not known. A medical power of attorney is a legal document that names your health care proxy. Depending on the laws in your state, after the document is written, it may also need to be:  Signed.  Notarized.  Dated.  Copied.  Witnessed.  Incorporated into your medical record. You may also want to appoint someone to manage your financial affairs in a situation in which you are unable to do so. This is called a durable power of attorney for finances. It is a separate legal document from the durable power of attorney for health care. You may choose the same person or someone different from your health care proxy to act as your agent in financial matters. If you do not appoint a proxy, or if there is a concern that the proxy is not acting in your best interests, a court-appointed guardian may be designated to act on your behalf. Living will A living will is a set of instructions documenting your wishes about medical care when you cannot express them yourself. Health  care providers should keep a copy of your living will in your medical record. You may want to give a copy to family members or friends. To alert caregivers in case of an emergency, you can place a card in your wallet to let them know that you have a living will and where they can find it. A living will is used if you become:  Terminally ill.  Incapacitated.  Unable to communicate or make decisions. Items to consider in your living will include:  The use or non-use of life-sustaining equipment, such as dialysis machines and breathing machines  (ventilators).  A DNR or DNAR order, which is the instruction not to use cardiopulmonary resuscitation (CPR) if breathing or heartbeat stops.  The use or non-use of tube feeding.  Withholding of food and fluids.  Comfort (palliative) care when the goal becomes comfort rather than a cure.  Organ and tissue donation. A living will does not give instructions for distributing your money and property if you should pass away. It is recommended that you seek the advice of a lawyer when writing a will. Decisions about taxes, beneficiaries, and asset distribution will be legally binding. This process can relieve your family and friends of any concerns surrounding disputes or questions that may come up about the distribution of your assets. DNR or DNAR A DNR or DNAR order is a request not to have CPR in the event that your heart stops beating or you stop breathing. If a DNR or DNAR order has not been made and shared, a health care provider will try to help any patient whose heart has stopped or who has stopped breathing. If you plan to have surgery, talk with your health care provider about how your DNR or DNAR order will be followed if problems occur. Summary  Advance directives are the legal documents that allow you to make choices ahead of time about your health care and medical treatment in case you become unable to communicate for yourself.  The process of discussing and writing advance directives should happen over time. You can change the advance directives, even after you have signed them.  Advance directives include DNR or DNAR orders, living wills, and designating an agent as your medical power of attorney. This information is not intended to replace advice given to you by your health care provider. Make sure you discuss any questions you have with your health care provider. Document Released: 04/24/2007 Document Revised: 12/05/2015 Document Reviewed: 12/05/2015 Elsevier Interactive Patient  Education  2019 [employer].

## 2018-07-21 ENCOUNTER — Other Ambulatory Visit: Payer: Self-pay | Admitting: Family Medicine

## 2018-07-21 ENCOUNTER — Telehealth: Payer: Self-pay | Admitting: Family Medicine

## 2018-07-21 DIAGNOSIS — B0229 Other postherpetic nervous system involvement: Secondary | ICD-10-CM

## 2018-07-21 MED ORDER — METHADONE HCL 10 MG PO TABS: 10.0000 mg | ORAL_TABLET | Freq: Three times a day (TID) | ORAL | 0 refills | Status: DC

## 2018-07-21 NOTE — Telephone Encounter (Signed)
Please contact the patient I sent in the prescription.

## 2018-07-21 NOTE — Telephone Encounter (Signed)
Patient aware.

## 2018-07-21 NOTE — Telephone Encounter (Signed)
Requesting refill - please advise

## 2018-07-22 ENCOUNTER — Other Ambulatory Visit: Payer: Self-pay | Admitting: *Deleted

## 2018-07-22 DIAGNOSIS — B0229 Other postherpetic nervous system involvement: Secondary | ICD-10-CM

## 2018-07-28 DIAGNOSIS — I2119 ST elevation (STEMI) myocardial infarction involving other coronary artery of inferior wall: Secondary | ICD-10-CM | POA: Diagnosis not present

## 2018-07-28 DIAGNOSIS — I482 Chronic atrial fibrillation, unspecified: Secondary | ICD-10-CM | POA: Diagnosis not present

## 2018-07-28 DIAGNOSIS — I5020 Unspecified systolic (congestive) heart failure: Secondary | ICD-10-CM | POA: Diagnosis not present

## 2018-08-11 ENCOUNTER — Encounter: Payer: Self-pay | Admitting: Family Medicine

## 2018-08-11 ENCOUNTER — Ambulatory Visit (INDEPENDENT_AMBULATORY_CARE_PROVIDER_SITE_OTHER): Payer: Medicare HMO | Admitting: Family Medicine

## 2018-08-11 ENCOUNTER — Other Ambulatory Visit: Payer: Self-pay

## 2018-08-11 DIAGNOSIS — I5020 Unspecified systolic (congestive) heart failure: Secondary | ICD-10-CM | POA: Diagnosis not present

## 2018-08-11 DIAGNOSIS — B0229 Other postherpetic nervous system involvement: Secondary | ICD-10-CM

## 2018-08-11 DIAGNOSIS — I480 Paroxysmal atrial fibrillation: Secondary | ICD-10-CM | POA: Diagnosis not present

## 2018-08-11 DIAGNOSIS — F1120 Opioid dependence, uncomplicated: Secondary | ICD-10-CM | POA: Insufficient documentation

## 2018-08-11 MED ORDER — METHADONE HCL 10 MG PO TABS: 10.0000 mg | ORAL_TABLET | Freq: Three times a day (TID) | ORAL | 0 refills | Status: DC

## 2018-08-11 NOTE — Progress Notes (Signed)
Subjective:    Patient ID: Sheena Morrow, female    DOB: Dec 20, 1933, 85 y.o.   MRN: [id number]   HPI: Sheena Morrow is a 85 y.o. female presenting for follow-up of atrial fibrillation. Patient denies any recent bouts of chest pain or palpitations. Additionally, patient is taking anticoagulants. Patient denies any recent excessive bleeding episodes including epistaxis, bleeding from the gums, genitalia, rectal bleeding or hematuria. Additionally there has been no excessive bruising. She is having occasional spells of   Follow-up postherpetic neuralgia.  Patient has been on methadone for 2 decades now.  She has not changed the dose.  Pain level remained stable at 1-3 as long as she takes her medicine.  She make sure she does not miss it because it is horrific if she does.  Pain is located in the mid back when it occurs.  This is where the herpetic lesion was many years ago.  PDMP review performed.  Morphine milligram equivalent daily is 90.  There are no discrepancies with her prescription fills.  Her last toxassure drug screen was performed in November 19, 2017.  It was unremarkable.  BP 105/57 at home today and HR 78  Depression screen [facility] 2/9 06/19/2018 02/25/2018 02/19/2018 08/19/2017 04/26/2017  Decreased Interest 0 0 0 0 0  Down, Depressed, Hopeless 0 0 0 0 0  PHQ - 2 Score 0 0 0 0 0     Relevant past medical, surgical, family and social history reviewed and updated as indicated.  Interim medical history since our last visit reviewed. Allergies and medications reviewed and updated.  ROS:  Review of Systems  Constitutional: Negative.   HENT: Negative for congestion.   Eyes: Negative for visual disturbance.  Respiratory: Negative for shortness of breath.   Cardiovascular: Negative for chest pain.  Gastrointestinal: Negative for abdominal pain, constipation, diarrhea, nausea and vomiting.  Genitourinary: Negative for difficulty urinating.  Musculoskeletal: Negative for arthralgias and  myalgias.  Neurological: Negative for headaches.  Psychiatric/Behavioral: Negative for sleep disturbance.     Social History   Tobacco Use  Smoking Status Former Smoker  . Packs/day: 0.50  . Types: Cigarettes  . Start date: 04/01/1969  . Quit date: 04/01/2008  . Years since quitting: 10.3  Smokeless Tobacco Never Used       Objective:     Wt Readings from Last 3 Encounters:  06/19/18 129 lb (58.5 kg)  02/25/18 129 lb (58.5 kg)  02/19/18 132 lb 6 oz (60 kg)     Exam deferred. Pt. Harboring due to COVID 19. Phone visit performed.   Assessment & Plan:   1. Postherpetic neuralgia   2. Paroxysmal atrial fibrillation (HCC)   3. Systolic CHF with reduced left ventricular function, NYHA class 2 ([place])   4. Uncomplicated opioid dependence ([place])     Meds ordered this encounter  Medications  . methadone (DOLOPHINE) 10 MG tablet    Sig: Take 1 tablet (10 mg total) by mouth 3 (three) times daily.    Dispense:  90 tablet    Refill:  0  . methadone (DOLOPHINE) 10 MG tablet    Sig: Take 1 tablet (10 mg total) by mouth 3 (three) times daily.    Dispense:  90 tablet    Refill:  0    No orders of the defined types were placed in this encounter.     Diagnoses and all orders for this visit:  Postherpetic neuralgia -     methadone (DOLOPHINE) 10 MG tablet; Take 1  tablet (10 mg total) by mouth 3 (three) times daily. -     methadone (DOLOPHINE) 10 MG tablet; Take 1 tablet (10 mg total) by mouth 3 (three) times daily.  Paroxysmal atrial fibrillation (HCC)  Systolic CHF with reduced left ventricular function, NYHA class 2 ([place])  Uncomplicated opioid dependence ([place])    Virtual Visit via telephone Note  I discussed the limitations, risks, security and privacy concerns of performing an evaluation and management service by telephone and the availability of in person appointments. The patient was identified with two identifiers. Pt.expressed understanding and agreed to proceed. Pt.  Is at home. Dr. Livia Snellen is in his office.  Follow Up Instructions:   I discussed the assessment and treatment plan with the patient. The patient was provided an opportunity to ask questions and all were answered. The patient agreed with the plan and demonstrated an understanding of the instructions.   The patient was advised to call back or seek an in-person evaluation if the symptoms worsen or if the condition fails to improve as anticipated.   Total minutes including chart review, PDMP review and phone contact time: 20   Follow up plan: Return in about 3 months (around 11/11/2018).  Claretta Fraise, MD [place]

## 2018-08-16 ENCOUNTER — Other Ambulatory Visit: Payer: Self-pay | Admitting: Family Medicine

## 2018-09-09 ENCOUNTER — Other Ambulatory Visit: Payer: Self-pay | Admitting: Family Medicine

## 2018-09-14 ENCOUNTER — Other Ambulatory Visit: Payer: Self-pay | Admitting: Family Medicine

## 2018-10-15 ENCOUNTER — Other Ambulatory Visit: Payer: Self-pay | Admitting: Family Medicine

## 2018-11-10 ENCOUNTER — Other Ambulatory Visit: Payer: Self-pay

## 2018-11-11 ENCOUNTER — Encounter: Payer: Self-pay | Admitting: Family Medicine

## 2018-11-11 ENCOUNTER — Ambulatory Visit (INDEPENDENT_AMBULATORY_CARE_PROVIDER_SITE_OTHER): Payer: Medicare HMO | Admitting: Family Medicine

## 2018-11-11 VITALS — BP 119/66 | HR 79 | Temp 95.1°F | Ht 61.0 in | Wt 150.0 lb

## 2018-11-11 DIAGNOSIS — I2510 Atherosclerotic heart disease of native coronary artery without angina pectoris: Secondary | ICD-10-CM

## 2018-11-11 DIAGNOSIS — B0229 Other postherpetic nervous system involvement: Secondary | ICD-10-CM

## 2018-11-11 DIAGNOSIS — F1120 Opioid dependence, uncomplicated: Secondary | ICD-10-CM | POA: Diagnosis not present

## 2018-11-11 DIAGNOSIS — I480 Paroxysmal atrial fibrillation: Secondary | ICD-10-CM

## 2018-11-11 DIAGNOSIS — Z23 Encounter for immunization: Secondary | ICD-10-CM

## 2018-11-11 MED ORDER — ESOMEPRAZOLE MAGNESIUM 40 MG PO CPDR: DELAYED_RELEASE_CAPSULE | ORAL | 1 refills | Status: DC

## 2018-11-11 MED ORDER — METHADONE HCL 10 MG PO TABS: 10.0000 mg | ORAL_TABLET | Freq: Three times a day (TID) | ORAL | 0 refills | Status: DC

## 2018-11-11 MED ORDER — METOPROLOL SUCCINATE ER 25 MG PO TB24: 25.0000 mg | ORAL_TABLET | Freq: Every day | ORAL | 1 refills | Status: DC

## 2018-11-11 MED ORDER — ATORVASTATIN CALCIUM 20 MG PO TABS: 20.0000 mg | ORAL_TABLET | Freq: Every day | ORAL | 1 refills | Status: DC

## 2018-11-11 MED ORDER — MONTELUKAST SODIUM 10 MG PO TABS: ORAL_TABLET | ORAL | 1 refills | Status: DC

## 2018-11-11 MED ORDER — LISINOPRIL 5 MG PO TABS: 5.0000 mg | ORAL_TABLET | Freq: Every day | ORAL | 1 refills | Status: DC

## 2018-11-11 NOTE — Progress Notes (Signed)
Subjective:  Patient ID: Sheena Morrow, female    DOB: 03-31-1933  Age: 85 y.o. MRN: [id number]  CC: Atrial Fibrillation (3 month follow up)   HPI Jessice Madill presents for recheck of her postherpetic neuralgia pain.  She missed a dose of the methadone a few days ago and pain was excruciating.  Patient is not good about rating pain between 1 and 10 but it was definitely quite high.  She did get complete relief as usual when she remembered and took the pill.  PDMP shows that she has complete compliance.  Toxassure to be performed today.   Patient in for follow-up of atrial fibrillation. Patient denies any recent bouts of chest pain or palpitations. Additionally, patient is taking anticoagulants. Patient denies any recent excessive bleeding episodes including epistaxis, bleeding from the gums, genitalia, rectal bleeding or hematuria. Additionally there has been no excessive bruising.  Patient has history of MI and coronary artery disease.  At this time she is not having any anginal symptoms.  Her condition is stable and her medications are in no need of adjustment.  Depression screen [facility] 2/9 11/11/2018 06/19/2018 02/25/2018  Decreased Interest 0 0 0  Down, Depressed, Hopeless 0 0 0  PHQ - 2 Score 0 0 0    History Elva has a past medical history of Clotting disorder ([place]), Constipation due to opioid therapy, Old MI (myocardial infarction) (07/21/2015), Osteoporosis, Postherpetic neuralgia, and Systolic CHF with reduced left ventricular function, NYHA class 2 ([place]) (05/21/2015).   She has no past surgical history on file.   Her family history is not on file.She reports that she quit smoking about 10 years ago. Her smoking use included cigarettes. She started smoking about 49 years ago. She smoked 0.50 packs per day. She has never used smokeless tobacco. She reports that she does not drink alcohol or use drugs.    ROS Review of Systems  Constitutional: Negative.   HENT: Negative for  congestion.   Eyes: Negative for visual disturbance.  Respiratory: Negative for shortness of breath.   Cardiovascular: Negative for chest pain.  Gastrointestinal: Negative for abdominal pain, constipation, diarrhea, nausea and vomiting.  Genitourinary: Negative for difficulty urinating.  Musculoskeletal: Positive for back pain (Between the shoulder blades due to postherpetic neuralgia). Negative for arthralgias and myalgias.  Neurological: Negative for headaches.  Psychiatric/Behavioral: Negative for sleep disturbance.    Objective:  BP 119/66   Pulse 79   Temp (!) 95.1 F (35.1 C) (Temporal)   Ht _0  (1.549 m)   Wt 150 lb (68 kg)   SpO2 98%   BMI 28.34 kg/m   BP Readings from Last 3 Encounters:  11/11/18 119/66  02/25/18 101/67  02/19/18 115/67    Wt Readings from Last 3 Encounters:  11/11/18 150 lb (68 kg)  06/19/18 129 lb (58.5 kg)  02/25/18 129 lb (58.5 kg)     Physical Exam Constitutional:      General: She is not in acute distress.    Appearance: She is well-developed.  HENT:     Head: Normocephalic and atraumatic.     Right Ear: External ear normal.     Left Ear: External ear normal.     Nose: Nose normal.  Eyes:     Conjunctiva/sclera: Conjunctivae normal.     Pupils: Pupils are equal, round, and reactive to light.  Neck:     Musculoskeletal: Normal range of motion and neck supple.     Thyroid: No thyromegaly.  Cardiovascular:  Rate and Rhythm: Normal rate and regular rhythm.     Heart sounds: Normal heart sounds. No murmur.  Pulmonary:     Effort: Pulmonary effort is normal. No respiratory distress.     Breath sounds: Normal breath sounds. No wheezing or rales.  Abdominal:     General: Bowel sounds are normal. There is no distension.     Palpations: Abdomen is soft.     Tenderness: There is no abdominal tenderness.  Lymphadenopathy:     Cervical: No cervical adenopathy.  Skin:    General: Skin is warm and dry.  Neurological:     Mental  Status: She is alert and oriented to person, place, and time.     Deep Tendon Reflexes: Reflexes are normal and symmetric.  Psychiatric:        Behavior: Behavior normal.        Thought Content: Thought content normal.        Judgment: Judgment normal.       Assessment & Plan:   Chelesa was seen today for atrial fibrillation.  Diagnoses and all orders for this visit:  CAD in native artery -     CBC with Differential/Platelet -     CMP14+EGFR -     Lipid panel -     TSH -     ToxASSURE Select 13 (MW), Urine  Postherpetic neuralgia -     methadone (DOLOPHINE) 10 MG tablet; Take 1 tablet (10 mg total) by mouth 3 (three) times daily. -     methadone (DOLOPHINE) 10 MG tablet; Take 1 tablet (10 mg total) by mouth 3 (three) times daily. -     CBC with Differential/Platelet -     CMP14+EGFR -     Lipid panel -     TSH -     ToxASSURE Select 13 (MW), Urine  Uncomplicated opioid dependence (HCC) -     CBC with Differential/Platelet -     CMP14+EGFR -     Lipid panel -     TSH -     ToxASSURE Select 13 (MW), Urine  Paroxysmal atrial fibrillation (HCC) -     CBC with Differential/Platelet -     CMP14+EGFR -     Lipid panel -     TSH -     ToxASSURE Select 13 (MW), Urine  Need for immunization against influenza -     Flu Vaccine QUAD High Dose(Fluad)  Other orders -     atorvastatin (LIPITOR) 20 MG tablet; Take 1 tablet (20 mg total) by mouth daily. -     esomeprazole (NEXIUM) 40 MG capsule; TAKE 1 CAPSULE BY MOUTH EVERY DAY -     lisinopril (ZESTRIL) 5 MG tablet; Take 1 tablet (5 mg total) by mouth daily. -     metoprolol succinate (TOPROL-XL) 25 MG 24 hr tablet; Take 1 tablet (25 mg total) by mouth daily. -     montelukast (SINGULAIR) 10 MG tablet; TAKE 1 TABLET BY MOUTH EVERYDAY AT BEDTIME -     methadone (DOLOPHINE) 10 MG tablet; Take 1 tablet (10 mg total) by mouth 3 (three) times daily.       I have changed Chaney Malling lisinopril and metoprolol succinate. I  am also having her start on methadone. Additionally, I am having her maintain her nitroGLYCERIN, triamcinolone cream, polyethylene glycol powder, denosumab, Ferrous Fumarate, gabapentin, Linzess, Xarelto, atorvastatin, esomeprazole, montelukast, methadone, and methadone.  Allergies as of 11/11/2018      Reactions   Nsaids  Other (See Comments)   GI Bleed      Medication List       Accurate as of November 11, 2018  5:36 PM. If you have any questions, ask your nurse or doctor.        atorvastatin 20 MG tablet Commonly known as: LIPITOR Take 1 tablet (20 mg total) by mouth daily.   denosumab 60 MG/ML Sosy injection Commonly known as: PROLIA Inject into the skin.   esomeprazole 40 MG capsule Commonly known as: [place] 1 CAPSULE BY MOUTH EVERY DAY What changed:   how much to take  how to take this  when to take this  additional instructions Changed by: Claretta Fraise, MD   Ferrous Fumarate 324 (106 Fe) MG Tabs tablet Commonly known as: HEMOCYTE - 106 mg FE Take 1 tablet (106 mg of iron total) by mouth 2 (two) times daily. For iron deficiency anemia   gabapentin 800 MG tablet Commonly known as: NEURONTIN TAKE 1 TABLET EACH MORNING,TAKE 1 TABLET EACH AFTERNOON & 2 TABLETS EACH EVENING   Linzess 290 MCG Caps capsule Generic drug: linaclotide TAKE 1 CAPSULE (290 MCG TOTAL) BY MOUTH DAILY. TO REGULATE BOWEL MOVEMENTS   lisinopril 5 MG tablet Commonly known as: ZESTRIL Take 1 tablet (5 mg total) by mouth daily.   methadone 10 MG tablet Commonly known as: Dolophine Take 1 tablet (10 mg total) by mouth 3 (three) times daily. What changed: You were already taking a medication with the same name, and this prescription was added. Make sure you understand how and when to take each. Changed by: Claretta Fraise, MD   methadone 10 MG tablet Commonly known as: Dolophine Take 1 tablet (10 mg total) by mouth 3 (three) times daily. Start taking on: December 16, 2018 What  changed: These instructions start on December 16, 2018. If you are unsure what to do until then, ask your doctor or other care provider. Changed by: Claretta Fraise, MD   methadone 10 MG tablet Commonly known as: Dolophine Take 1 tablet (10 mg total) by mouth 3 (three) times daily. Start taking on: January 15, 2019 What changed: These instructions start on January 15, 2019. If you are unsure what to do until then, ask your doctor or other care provider. Changed by: Claretta Fraise, MD   metoprolol succinate 25 MG 24 hr tablet Commonly known as: TOPROL-XL Take 1 tablet (25 mg total) by mouth daily.   montelukast 10 MG tablet Commonly known as: SINGULAIR TAKE 1 TABLET BY MOUTH EVERYDAY AT BEDTIME   nitroGLYCERIN 0.4 MG SL tablet Commonly known as: NITROSTAT Place under the tongue.   polyethylene glycol powder 17 GM/SCOOP powder Commonly known as: GLYCOLAX/MIRALAX USE 1 DOSING CUP ORALLY IN 8 OZ. OF WATER DAILY   triamcinolone cream 0.1 % Commonly known as: KENALOG APPLY TOPICALLY 2 (TWO) TIMES DAILY. TO THE SCALP AS NEEDED   Xarelto 15 MG Tabs tablet Generic drug: Rivaroxaban TAKE 1 TABLET BY MOUTH EVERY DAY        Follow-up: Return in about 3 months (around 02/11/2019).  Claretta Fraise, M.D.

## 2018-11-12 ENCOUNTER — Telehealth: Payer: Self-pay | Admitting: Family Medicine

## 2018-11-12 ENCOUNTER — Other Ambulatory Visit: Payer: Self-pay | Admitting: Family Medicine

## 2018-11-12 DIAGNOSIS — D509 Iron deficiency anemia, unspecified: Secondary | ICD-10-CM

## 2018-11-12 LAB — LIPID PANEL
Chol/HDL Ratio: 2.5 ratio (ref 0.0–4.4)
Cholesterol, Total: 128 mg/dL (ref 100–199)
HDL: 52 mg/dL (ref >39)
LDL Chol Calc (NIH): 58 mg/dL (ref 0–99)
Triglycerides: 96 mg/dL (ref 0–149)
VLDL Cholesterol Cal: 18 mg/dL (ref 5–40)

## 2018-11-12 LAB — CMP14+EGFR
ALT: 16 IU/L (ref 0–32)
AST: 39 IU/L (ref 0–40)
Albumin/Globulin Ratio: 1.4 (ref 1.2–2.2)
Albumin: 3.9 g/dL (ref 3.6–4.6)
Alkaline Phosphatase: 88 IU/L (ref 39–117)
BUN/Creatinine Ratio: 17 (ref 12–28)
BUN: 28 mg/dL — ABNORMAL HIGH (ref 8–27)
Bilirubin Total: 0.2 mg/dL (ref 0.0–1.2)
CO2: 23 mmol/L (ref 20–29)
Calcium: 8.9 mg/dL (ref 8.7–10.3)
Chloride: 105 mmol/L (ref 96–106)
Creatinine, Ser: 1.61 mg/dL — ABNORMAL HIGH (ref 0.57–1.00)
GFR calc Af Amer: 34 mL/min/{1.73_m2} — ABNORMAL LOW (ref >59)
GFR calc non Af Amer: 29 mL/min/{1.73_m2} — ABNORMAL LOW (ref >59)
Globulin, Total: 2.7 g/dL (ref 1.5–4.5)
Glucose: 85 mg/dL (ref 65–99)
Potassium: 4.7 mmol/L (ref 3.5–5.2)
Sodium: 141 mmol/L (ref 134–144)
Total Protein: 6.6 g/dL (ref 6.0–8.5)

## 2018-11-12 LAB — CBC WITH DIFFERENTIAL/PLATELET
Basophils Absolute: 0.1 10*3/uL (ref 0.0–0.2)
Basos: 1 %
EOS (ABSOLUTE): 0.1 10*3/uL (ref 0.0–0.4)
Eos: 2 %
Hematocrit: 27.6 % — ABNORMAL LOW (ref 34.0–46.6)
Hemoglobin: 8.7 g/dL — CL (ref 11.1–15.9)
Immature Grans (Abs): 0 10*3/uL (ref 0.0–0.1)
Immature Granulocytes: 0 %
Lymphocytes Absolute: 1.3 10*3/uL (ref 0.7–3.1)
Lymphs: 25 %
MCH: 31.2 pg (ref 26.6–33.0)
MCHC: 31.5 g/dL (ref 31.5–35.7)
MCV: 99 fL — ABNORMAL HIGH (ref 79–97)
Monocytes Absolute: 0.3 10*3/uL (ref 0.1–0.9)
Monocytes: 6 %
Neutrophils Absolute: 3.4 10*3/uL (ref 1.4–7.0)
Neutrophils: 66 %
Platelets: 229 10*3/uL (ref 150–450)
RBC: 2.79 x10E6/uL — ABNORMAL LOW (ref 3.77–5.28)
RDW: 13.1 % (ref 11.7–15.4)
WBC: 5.2 10*3/uL (ref 3.4–10.8)

## 2018-11-12 LAB — TSH: TSH: 1.31 u[IU]/mL (ref 0.450–4.500)

## 2018-11-12 NOTE — Telephone Encounter (Signed)
Called [facility] per their request will fax over referral and office notes and labs they will call patient and se up appt. They will call us back and let us know when it has been set up

## 2018-11-14 LAB — TOXASSURE SELECT 13 (MW), URINE

## 2018-11-20 ENCOUNTER — Other Ambulatory Visit: Payer: Self-pay | Admitting: Family Medicine

## 2018-11-20 DIAGNOSIS — Z87891 Personal history of nicotine dependence: Secondary | ICD-10-CM | POA: Diagnosis not present

## 2018-11-20 DIAGNOSIS — N183 Chronic kidney disease, stage 3 unspecified: Secondary | ICD-10-CM | POA: Diagnosis not present

## 2018-11-20 DIAGNOSIS — I2510 Atherosclerotic heart disease of native coronary artery without angina pectoris: Secondary | ICD-10-CM | POA: Diagnosis not present

## 2018-11-20 DIAGNOSIS — I4891 Unspecified atrial fibrillation: Secondary | ICD-10-CM | POA: Diagnosis not present

## 2018-11-20 DIAGNOSIS — G8929 Other chronic pain: Secondary | ICD-10-CM | POA: Diagnosis not present

## 2018-11-20 DIAGNOSIS — D649 Anemia, unspecified: Secondary | ICD-10-CM | POA: Diagnosis not present

## 2018-11-20 DIAGNOSIS — Z8673 Personal history of transient ischemic attack (TIA), and cerebral infarction without residual deficits: Secondary | ICD-10-CM | POA: Diagnosis not present

## 2018-11-20 DIAGNOSIS — D631 Anemia in chronic kidney disease: Secondary | ICD-10-CM | POA: Diagnosis not present

## 2018-11-20 DIAGNOSIS — D509 Iron deficiency anemia, unspecified: Secondary | ICD-10-CM | POA: Diagnosis not present

## 2018-11-20 DIAGNOSIS — I129 Hypertensive chronic kidney disease with stage 1 through stage 4 chronic kidney disease, or unspecified chronic kidney disease: Secondary | ICD-10-CM | POA: Diagnosis not present

## 2018-11-20 DIAGNOSIS — Z7901 Long term (current) use of anticoagulants: Secondary | ICD-10-CM | POA: Diagnosis not present

## 2018-11-25 DIAGNOSIS — J45909 Unspecified asthma, uncomplicated: Secondary | ICD-10-CM | POA: Diagnosis not present

## 2018-11-25 DIAGNOSIS — F1190 Opioid use, unspecified, uncomplicated: Secondary | ICD-10-CM | POA: Diagnosis not present

## 2018-11-25 DIAGNOSIS — I4891 Unspecified atrial fibrillation: Secondary | ICD-10-CM | POA: Diagnosis not present

## 2018-11-25 DIAGNOSIS — M7989 Other specified soft tissue disorders: Secondary | ICD-10-CM | POA: Diagnosis not present

## 2018-11-25 DIAGNOSIS — I2510 Atherosclerotic heart disease of native coronary artery without angina pectoris: Secondary | ICD-10-CM | POA: Diagnosis not present

## 2018-11-25 DIAGNOSIS — R9082 White matter disease, unspecified: Secondary | ICD-10-CM | POA: Diagnosis not present

## 2018-11-25 DIAGNOSIS — W108XXA Fall (on) (from) other stairs and steps, initial encounter: Secondary | ICD-10-CM | POA: Diagnosis not present

## 2018-11-25 DIAGNOSIS — I6782 Cerebral ischemia: Secondary | ICD-10-CM | POA: Diagnosis not present

## 2018-11-25 DIAGNOSIS — Z87891 Personal history of nicotine dependence: Secondary | ICD-10-CM | POA: Diagnosis not present

## 2018-11-25 DIAGNOSIS — Z79899 Other long term (current) drug therapy: Secondary | ICD-10-CM | POA: Diagnosis not present

## 2018-11-25 DIAGNOSIS — G319 Degenerative disease of nervous system, unspecified: Secondary | ICD-10-CM | POA: Diagnosis not present

## 2018-11-25 DIAGNOSIS — S5002XA Contusion of left elbow, initial encounter: Secondary | ICD-10-CM | POA: Diagnosis not present

## 2018-11-25 DIAGNOSIS — I272 Pulmonary hypertension, unspecified: Secondary | ICD-10-CM | POA: Diagnosis not present

## 2018-11-25 DIAGNOSIS — Z7901 Long term (current) use of anticoagulants: Secondary | ICD-10-CM | POA: Diagnosis not present

## 2018-11-25 DIAGNOSIS — Z8673 Personal history of transient ischemic attack (TIA), and cerebral infarction without residual deficits: Secondary | ICD-10-CM | POA: Diagnosis not present

## 2018-11-25 DIAGNOSIS — G9389 Other specified disorders of brain: Secondary | ICD-10-CM | POA: Diagnosis not present

## 2018-11-28 DIAGNOSIS — I4891 Unspecified atrial fibrillation: Secondary | ICD-10-CM | POA: Diagnosis not present

## 2018-11-28 DIAGNOSIS — D509 Iron deficiency anemia, unspecified: Secondary | ICD-10-CM | POA: Diagnosis not present

## 2018-11-28 DIAGNOSIS — D631 Anemia in chronic kidney disease: Secondary | ICD-10-CM | POA: Diagnosis not present

## 2018-11-28 DIAGNOSIS — I2510 Atherosclerotic heart disease of native coronary artery without angina pectoris: Secondary | ICD-10-CM | POA: Diagnosis not present

## 2018-11-28 DIAGNOSIS — G8929 Other chronic pain: Secondary | ICD-10-CM | POA: Diagnosis not present

## 2018-11-28 DIAGNOSIS — Z87891 Personal history of nicotine dependence: Secondary | ICD-10-CM | POA: Diagnosis not present

## 2018-11-28 DIAGNOSIS — D649 Anemia, unspecified: Secondary | ICD-10-CM | POA: Diagnosis not present

## 2018-11-28 DIAGNOSIS — Z8673 Personal history of transient ischemic attack (TIA), and cerebral infarction without residual deficits: Secondary | ICD-10-CM | POA: Diagnosis not present

## 2018-11-28 DIAGNOSIS — N183 Chronic kidney disease, stage 3 unspecified: Secondary | ICD-10-CM | POA: Diagnosis not present

## 2018-11-28 DIAGNOSIS — Z7901 Long term (current) use of anticoagulants: Secondary | ICD-10-CM | POA: Diagnosis not present

## 2018-11-28 DIAGNOSIS — I129 Hypertensive chronic kidney disease with stage 1 through stage 4 chronic kidney disease, or unspecified chronic kidney disease: Secondary | ICD-10-CM | POA: Diagnosis not present

## 2018-12-05 DIAGNOSIS — D509 Iron deficiency anemia, unspecified: Secondary | ICD-10-CM | POA: Diagnosis not present

## 2018-12-09 DIAGNOSIS — H26493 Other secondary cataract, bilateral: Secondary | ICD-10-CM | POA: Diagnosis not present

## 2019-01-13 ENCOUNTER — Other Ambulatory Visit: Payer: Self-pay | Admitting: Family Medicine

## 2019-01-19 DIAGNOSIS — I10 Essential (primary) hypertension: Secondary | ICD-10-CM | POA: Diagnosis not present

## 2019-01-19 DIAGNOSIS — Z7901 Long term (current) use of anticoagulants: Secondary | ICD-10-CM | POA: Diagnosis not present

## 2019-01-19 DIAGNOSIS — J45909 Unspecified asthma, uncomplicated: Secondary | ICD-10-CM | POA: Diagnosis not present

## 2019-01-19 DIAGNOSIS — Z5181 Encounter for therapeutic drug level monitoring: Secondary | ICD-10-CM | POA: Diagnosis not present

## 2019-01-19 DIAGNOSIS — I2510 Atherosclerotic heart disease of native coronary artery without angina pectoris: Secondary | ICD-10-CM | POA: Diagnosis not present

## 2019-01-19 DIAGNOSIS — D649 Anemia, unspecified: Secondary | ICD-10-CM | POA: Diagnosis not present

## 2019-01-19 DIAGNOSIS — D509 Iron deficiency anemia, unspecified: Secondary | ICD-10-CM | POA: Diagnosis not present

## 2019-01-19 DIAGNOSIS — Z87891 Personal history of nicotine dependence: Secondary | ICD-10-CM | POA: Diagnosis not present

## 2019-01-19 DIAGNOSIS — G8929 Other chronic pain: Secondary | ICD-10-CM | POA: Diagnosis not present

## 2019-01-19 DIAGNOSIS — Z8673 Personal history of transient ischemic attack (TIA), and cerebral infarction without residual deficits: Secondary | ICD-10-CM | POA: Diagnosis not present

## 2019-02-11 ENCOUNTER — Other Ambulatory Visit: Payer: Self-pay

## 2019-02-12 ENCOUNTER — Encounter: Payer: Self-pay | Admitting: Family Medicine

## 2019-02-12 ENCOUNTER — Ambulatory Visit (INDEPENDENT_AMBULATORY_CARE_PROVIDER_SITE_OTHER): Payer: Medicare HMO | Admitting: Family Medicine

## 2019-02-12 VITALS — BP 123/65 | HR 95 | Temp 97.8°F | Ht 61.0 in | Wt 148.2 lb

## 2019-02-12 DIAGNOSIS — I2510 Atherosclerotic heart disease of native coronary artery without angina pectoris: Secondary | ICD-10-CM | POA: Diagnosis not present

## 2019-02-12 DIAGNOSIS — N1831 Chronic kidney disease, stage 3a: Secondary | ICD-10-CM | POA: Diagnosis not present

## 2019-02-12 DIAGNOSIS — I480 Paroxysmal atrial fibrillation: Secondary | ICD-10-CM | POA: Diagnosis not present

## 2019-02-12 DIAGNOSIS — B0229 Other postherpetic nervous system involvement: Secondary | ICD-10-CM

## 2019-02-12 DIAGNOSIS — F1120 Opioid dependence, uncomplicated: Secondary | ICD-10-CM | POA: Diagnosis not present

## 2019-02-12 DIAGNOSIS — L659 Nonscarring hair loss, unspecified: Secondary | ICD-10-CM

## 2019-02-12 DIAGNOSIS — R6889 Other general symptoms and signs: Secondary | ICD-10-CM | POA: Diagnosis not present

## 2019-02-12 MED ORDER — METHADONE HCL 10 MG PO TABS: 10.0000 mg | ORAL_TABLET | Freq: Three times a day (TID) | ORAL | 0 refills | Status: DC

## 2019-02-12 MED ORDER — DILTIAZEM HCL ER COATED BEADS 120 MG PO CP24: 120.0000 mg | ORAL_CAPSULE | Freq: Every day | ORAL | 1 refills | Status: DC

## 2019-02-12 NOTE — Progress Notes (Signed)
Subjective:  Patient ID: Sheena Morrow, female    DOB: Aug 22, 1933  Age: 86 y.o. MRN: [id number]  CC: Follow-up (3 month)   HPI Sheena Morrow presents for follow-up of chronic pain.  She has been maintained on methadone for over a decade.  She has never needed an increase in her dose.  Actually her main concern today is not her pain.  That is well controlled as long as she takes her medication.  PDMP review shows that she is compliant with regard to appropriate use of her medicine and no sign of doctor shopping etc.  Hair coming out by the handful for a while. Little by little for a few years - getting much worse.  Atrial fibrillation follow up. Pt. is treated with rate control and anticoagulation. Pt.  denies palpitations, rapid rate, chest pain, dyspnea and edema. There has been no bleeding from nose or gums. Pt. has not noticed blood with urine or stool.  Although there is routine bruising easily, it is not excessive.   Depression screen [facility] 2/9 02/12/2019 11/11/2018 06/19/2018  Decreased Interest 0 0 0  Down, Depressed, Hopeless 0 0 0  PHQ - 2 Score 0 0 0    History Sheena Morrow has a past medical history of Clotting disorder ([place]), Constipation due to opioid therapy, Old MI (myocardial infarction) (07/21/2015), Osteoporosis, Postherpetic neuralgia, and Systolic CHF with reduced left ventricular function, NYHA class 2 ([place]) (05/21/2015).   She has no past surgical history on file.   Her family history is not on file.She reports that she quit smoking about 10 years ago. Her smoking use included cigarettes. She started smoking about 49 years ago. She smoked 0.50 packs per day. She has never used smokeless tobacco. She reports that she does not drink alcohol or use drugs.    ROS Review of Systems  Constitutional: Negative.   HENT: Negative.   Eyes: Negative for visual disturbance.  Respiratory: Negative for shortness of breath.   Cardiovascular: Negative for chest pain.  Gastrointestinal:  Negative for abdominal pain.  Musculoskeletal: Negative for arthralgias.    Objective:  BP 123/65   Pulse 95   Temp 97.8 F (36.6 C) (Temporal)   Ht 5\' 1"  (1.549 m)   Wt 148 lb 3.2 oz (67.2 kg)   BMI 28.00 kg/m   BP Readings from Last 3 Encounters:  02/12/19 123/65  11/11/18 119/66  02/25/18 101/67    Wt Readings from Last 3 Encounters:  02/12/19 148 lb 3.2 oz (67.2 kg)  11/11/18 150 lb (68 kg)  06/19/18 129 lb (58.5 kg)     Physical Exam Constitutional:      General: She is not in acute distress.    Appearance: She is well-developed. She is ill-appearing (Pt. becoming more frail over time).  HENT:     Head: Normocephalic and atraumatic.  Eyes:     Conjunctiva/sclera: Conjunctivae normal.     Pupils: Pupils are equal, round, and reactive to light.  Neck:     Thyroid: No thyromegaly.  Cardiovascular:     Rate and Rhythm: Normal rate and regular rhythm.     Heart sounds: Normal heart sounds. No murmur.  Pulmonary:     Effort: Pulmonary effort is normal. No respiratory distress.     Breath sounds: Normal breath sounds. No wheezing or rales.  Abdominal:     General: Bowel sounds are normal. There is no distension.     Palpations: Abdomen is soft.     Tenderness: There is no  abdominal tenderness.  Musculoskeletal:        General: Normal range of motion.     Cervical back: Normal range of motion and neck supple.  Lymphadenopathy:     Cervical: No cervical adenopathy.  Skin:    General: Skin is warm and dry.     Comments: Hair is significantly thinner than baseline.  Neurological:     Mental Status: She is alert and oriented to person, place, and time.  Psychiatric:        Behavior: Behavior normal.        Thought Content: Thought content normal.        Judgment: Judgment normal.    Results for orders placed or performed in visit on 02/12/19  CBC  Result Value Ref Range   WBC 5.0 3.4 - 10.8 x10E3/uL   RBC 3.02 (L) 3.77 - 5.28 x10E6/uL   Hemoglobin 10.3  (L) 11.1 - 15.9 g/dL   Hematocrit 31.4 (L) 34.0 - 46.6 %   MCV 104 (H) 79 - 97 fL   MCH 34.1 (H) 26.6 - 33.0 pg   MCHC 32.8 31.5 - 35.7 g/dL   RDW 13.3 11.7 - 15.4 %   Platelets 190 150 - 450 x10E3/uL   Neutrophils 48 Not Estab. %   Lymphs 38 Not Estab. %   Monocytes 8 Not Estab. %   Eos 4 Not Estab. %   Basos 2 Not Estab. %   Neutrophils Absolute 2.4 1.4 - 7.0 x10E3/uL   Lymphocytes Absolute 1.9 0.7 - 3.1 x10E3/uL   Monocytes Absolute 0.4 0.1 - 0.9 x10E3/uL   EOS (ABSOLUTE) 0.2 0.0 - 0.4 x10E3/uL   Basophils Absolute 0.1 0.0 - 0.2 x10E3/uL   Immature Granulocytes 0 Not Estab. %   Immature Grans (Abs) 0.0 0.0 - 0.1 x10E3/uL  CMP  Result Value Ref Range   Glucose 99 65 - 99 mg/dL   BUN 22 8 - 27 mg/dL   Creatinine, Ser 1.39 (H) 0.57 - 1.00 mg/dL   GFR calc non Af Amer 35 (L) >59 mL/min/1.73   GFR calc Af Amer 40 (L) >59 mL/min/1.73   BUN/Creatinine Ratio 16 12 - 28   Sodium 142 134 - 144 mmol/L   Potassium 4.7 3.5 - 5.2 mmol/L   Chloride 105 96 - 106 mmol/L   CO2 25 20 - 29 mmol/L   Calcium 8.8 8.7 - 10.3 mg/dL   Total Protein 6.7 6.0 - 8.5 g/dL   Albumin 3.8 3.6 - 4.6 g/dL   Globulin, Total 2.9 1.5 - 4.5 g/dL   Albumin/Globulin Ratio 1.3 1.2 - 2.2   Bilirubin Total <0.2 0.0 - 1.2 mg/dL   Alkaline Phosphatase 98 39 - 117 IU/L   AST 45 (H) 0 - 40 IU/L   ALT 22 0 - 32 IU/L  Lipid  Result Value Ref Range   Cholesterol, Total 125 100 - 199 mg/dL   Triglycerides 63 0 - 149 mg/dL   HDL 56 >39 mg/dL   VLDL Cholesterol Cal 13 5 - 40 mg/dL   LDL Chol Calc (NIH) 56 0 - 99 mg/dL   Chol/HDL Ratio 2.2 0.0 - 4.4 ratio  TSH + free T4  Result Value Ref Range   TSH 2.340 0.450 - 4.500 uIU/mL   Free T4 1.06 0.82 - 1.77 ng/dL      Assessment & Plan:   Sheena Morrow was seen today for follow-up.  Diagnoses and all orders for this visit:  Alopecia -     CMP -  TSH + free T4 -     Ambulatory referral to Endocrinology  Postherpetic neuralgia -     methadone (DOLOPHINE) 10 MG  tablet; Take 1 tablet (10 mg total) by mouth 3 (three) times daily. -     methadone (DOLOPHINE) 10 MG tablet; Take 1 tablet (10 mg total) by mouth 3 (three) times daily. -     CMP  Paroxysmal atrial fibrillation (HCC) -     CMP -     TSH + free T4  Uncomplicated opioid dependence (HCC) -     CMP  Stage 3a chronic kidney disease -     CMP  CAD in native artery -     CBC -     CMP -     Lipid -     TSH + free T4  Other orders -     methadone (DOLOPHINE) 10 MG tablet; Take 1 tablet (10 mg total) by mouth 3 (three) times daily. -     diltiazem (CARDIZEM CD) 120 MG 24 hr capsule; Take 1 capsule (120 mg total) by mouth daily. For blood pressure    Over time I have seen multiple episodes of hair loss in ladies with the use of beta-blockers.  At this time I had like to give her a trial of Cardizem and see if that slows down the hair loss and keeps her atrial fibrillation under control.  Her other diagnoses including her chronic pain from postherpetic neuralgia, her atrial fibrillation, her CAD are under good control and or symptomatic at this time.  She has a specific request to see a specific endocrinologist regarding her hair loss.  She is very concerned that this could be thyroid related.  Thyroid functions are ordered.   I have discontinued Chaney Malling metoprolol succinate. I am also having her start on diltiazem. Additionally, I am having her maintain her nitroGLYCERIN, triamcinolone cream, polyethylene glycol powder, denosumab, Ferrous Fumarate, gabapentin, atorvastatin, esomeprazole, lisinopril, montelukast, Linzess, Xarelto, Restasis, methadone, methadone, and methadone.  Allergies as of 02/12/2019      Reactions   Nsaids Other (See Comments)   GI Bleed      Medication List       Accurate as of February 12, 2019 11:59 PM. If you have any questions, ask your nurse or doctor.        STOP taking these medications   metoprolol succinate 25 MG 24 hr tablet Commonly known  as: TOPROL-XL Stopped by: Claretta Fraise, MD     TAKE these medications   atorvastatin 20 MG tablet Commonly known as: LIPITOR Take 1 tablet (20 mg total) by mouth daily.   denosumab 60 MG/ML Sosy injection Commonly known as: PROLIA Inject into the skin.   diltiazem 120 MG 24 hr capsule Commonly known as: Cardizem CD Take 1 capsule (120 mg total) by mouth daily. For blood pressure Started by: Claretta Fraise, MD   esomeprazole 40 MG capsule Commonly known as: NEXIUM TAKE 1 CAPSULE BY MOUTH EVERY DAY   Ferrous Fumarate 324 (106 Fe) MG Tabs tablet Commonly known as: HEMOCYTE - 106 mg FE Take 1 tablet (106 mg of iron total) by mouth 2 (two) times daily. For iron deficiency anemia   gabapentin 800 MG tablet Commonly known as: NEURONTIN TAKE 1 TABLET EACH MORNING,TAKE 1 TABLET EACH AFTERNOON & 2 TABLETS EACH EVENING   Linzess 290 MCG Caps capsule Generic drug: linaclotide TAKE 1 CAPSULE (290 MCG TOTAL) BY MOUTH DAILY. TO REGULATE BOWEL MOVEMENTS   lisinopril  5 MG tablet Commonly known as: ZESTRIL Take 1 tablet (5 mg total) by mouth daily.   methadone 10 MG tablet Commonly known as: Dolophine Take 1 tablet (10 mg total) by mouth 3 (three) times daily. What changed: Another medication with the same name was changed. Make sure you understand how and when to take each. Changed by: Claretta Fraise, MD   methadone 10 MG tablet Commonly known as: Dolophine Take 1 tablet (10 mg total) by mouth 3 (three) times daily. Start taking on: March 21, 2019 What changed: These instructions start on March 21, 2019. If you are unsure what to do until then, ask your doctor or other care provider. Changed by: Claretta Fraise, MD   methadone 10 MG tablet Commonly known as: Dolophine Take 1 tablet (10 mg total) by mouth 3 (three) times daily. Start taking on: April 20, 2019 What changed: These instructions start on April 20, 2019. If you are unsure what to do until then, ask your doctor or  other care provider. Changed by: Claretta Fraise, MD   montelukast 10 MG tablet Commonly known as: SINGULAIR TAKE 1 TABLET BY MOUTH EVERYDAY AT BEDTIME   nitroGLYCERIN 0.4 MG SL tablet Commonly known as: NITROSTAT Place under the tongue.   polyethylene glycol powder 17 GM/SCOOP powder Commonly known as: GLYCOLAX/MIRALAX USE 1 DOSING CUP ORALLY IN 8 OZ. OF WATER DAILY   Restasis 0.05 % ophthalmic emulsion Generic drug: cycloSPORINE   triamcinolone cream 0.1 % Commonly known as: KENALOG APPLY TOPICALLY 2 (TWO) TIMES DAILY. TO THE SCALP AS NEEDED   Xarelto 15 MG Tabs tablet Generic drug: Rivaroxaban TAKE 1 TABLET BY MOUTH EVERY DAY      I explained to the patient that dermatology might be able to help her more with this problem and then endocrinology.  However she feels strongly about seeing this physician for this consult based on community recommendations.  Therefore the referral has been made.  Should this not give her the relief of alopecia, along with deseeding the beta-blocker, dermatology referral can be pursued at a later date at her request.  Follow-up: Return in about 3 months (around 05/13/2019).  Claretta Fraise, M.D.

## 2019-02-13 LAB — CBC WITH DIFFERENTIAL/PLATELET
Basophils Absolute: 0.1 10*3/uL (ref 0.0–0.2)
Basos: 2 %
EOS (ABSOLUTE): 0.2 10*3/uL (ref 0.0–0.4)
Eos: 4 %
Hematocrit: 31.4 % — ABNORMAL LOW (ref 34.0–46.6)
Hemoglobin: 10.3 g/dL — ABNORMAL LOW (ref 11.1–15.9)
Immature Grans (Abs): 0 10*3/uL (ref 0.0–0.1)
Immature Granulocytes: 0 %
Lymphocytes Absolute: 1.9 10*3/uL (ref 0.7–3.1)
Lymphs: 38 %
MCH: 34.1 pg — ABNORMAL HIGH (ref 26.6–33.0)
MCHC: 32.8 g/dL (ref 31.5–35.7)
MCV: 104 fL — ABNORMAL HIGH (ref 79–97)
Monocytes Absolute: 0.4 10*3/uL (ref 0.1–0.9)
Monocytes: 8 %
Neutrophils Absolute: 2.4 10*3/uL (ref 1.4–7.0)
Neutrophils: 48 %
Platelets: 190 10*3/uL (ref 150–450)
RBC: 3.02 x10E6/uL — ABNORMAL LOW (ref 3.77–5.28)
RDW: 13.3 % (ref 11.7–15.4)
WBC: 5 10*3/uL (ref 3.4–10.8)

## 2019-02-13 LAB — CMP14+EGFR
ALT: 22 IU/L (ref 0–32)
AST: 45 IU/L — ABNORMAL HIGH (ref 0–40)
Albumin/Globulin Ratio: 1.3 (ref 1.2–2.2)
Albumin: 3.8 g/dL (ref 3.6–4.6)
Alkaline Phosphatase: 98 IU/L (ref 39–117)
BUN/Creatinine Ratio: 16 (ref 12–28)
BUN: 22 mg/dL (ref 8–27)
Bilirubin Total: <0.2 mg/dL (ref 0.0–1.2)
CO2: 25 mmol/L (ref 20–29)
Calcium: 8.8 mg/dL (ref 8.7–10.3)
Chloride: 105 mmol/L (ref 96–106)
Creatinine, Ser: 1.39 mg/dL — ABNORMAL HIGH (ref 0.57–1.00)
GFR calc Af Amer: 40 mL/min/{1.73_m2} — ABNORMAL LOW (ref >59)
GFR calc non Af Amer: 35 mL/min/{1.73_m2} — ABNORMAL LOW (ref >59)
Globulin, Total: 2.9 g/dL (ref 1.5–4.5)
Glucose: 99 mg/dL (ref 65–99)
Potassium: 4.7 mmol/L (ref 3.5–5.2)
Sodium: 142 mmol/L (ref 134–144)
Total Protein: 6.7 g/dL (ref 6.0–8.5)

## 2019-02-13 LAB — LIPID PANEL
Chol/HDL Ratio: 2.2 ratio (ref 0.0–4.4)
Cholesterol, Total: 125 mg/dL (ref 100–199)
HDL: 56 mg/dL (ref >39)
LDL Chol Calc (NIH): 56 mg/dL (ref 0–99)
Triglycerides: 63 mg/dL (ref 0–149)
VLDL Cholesterol Cal: 13 mg/dL (ref 5–40)

## 2019-02-13 LAB — TSH+FREE T4
Free T4: 1.06 ng/dL (ref 0.82–1.77)
TSH: 2.34 u[IU]/mL (ref 0.450–4.500)

## 2019-02-15 ENCOUNTER — Encounter: Payer: Self-pay | Admitting: Family Medicine

## 2019-02-17 LAB — VITAMIN B12: Vitamin B-12: 468 pg/mL (ref 232–1245)

## 2019-02-17 LAB — SPECIMEN STATUS REPORT

## 2019-02-17 LAB — FOLATE: Folate: 17.2 ng/mL (ref >3.0)

## 2019-03-12 ENCOUNTER — Other Ambulatory Visit: Payer: Self-pay | Admitting: Family Medicine

## 2019-03-23 ENCOUNTER — Other Ambulatory Visit: Payer: Self-pay | Admitting: Family Medicine

## 2019-03-23 NOTE — Telephone Encounter (Signed)
Patient was seen on 02/12/19 but the Meloxicam is not on her medication list.

## 2019-04-07 ENCOUNTER — Telehealth: Payer: Self-pay | Admitting: Family Medicine

## 2019-04-07 ENCOUNTER — Other Ambulatory Visit: Payer: Self-pay | Admitting: Family Medicine

## 2019-04-07 MED ORDER — FUROSEMIDE 20 MG PO TABS: 20.0000 mg | ORAL_TABLET | Freq: Every day | ORAL | 1 refills | Status: DC | PRN

## 2019-04-07 NOTE — Telephone Encounter (Signed)
Furosemide 20 has been removed from med list. States legs swelling. Okay to refill?

## 2019-04-07 NOTE — Telephone Encounter (Signed)
I sent in the requested prescription 

## 2019-04-07 NOTE — Telephone Encounter (Signed)
  Medication Request  04/07/2019  What is the name of the medication? Ferosmide 20 mg Don't show on med list. Her legs are swelling  Have you contacted your pharmacy to request a refill? NO   Which pharmacy would you like this sent to? [facility]   Patient notified that their request is being sent to the clinical staff for review and that they should receive a call once it is complete. If they do not receive a call within 24 hours they can check with their pharmacy or our office.

## 2019-04-13 ENCOUNTER — Other Ambulatory Visit: Payer: Self-pay | Admitting: Family Medicine

## 2019-04-22 ENCOUNTER — Other Ambulatory Visit: Payer: Self-pay | Admitting: Family Medicine

## 2019-04-29 ENCOUNTER — Encounter: Payer: Self-pay | Admitting: Family Medicine

## 2019-04-29 DIAGNOSIS — D509 Iron deficiency anemia, unspecified: Secondary | ICD-10-CM | POA: Diagnosis not present

## 2019-04-29 DIAGNOSIS — L659 Nonscarring hair loss, unspecified: Secondary | ICD-10-CM | POA: Diagnosis not present

## 2019-04-29 DIAGNOSIS — D649 Anemia, unspecified: Secondary | ICD-10-CM | POA: Diagnosis not present

## 2019-04-29 DIAGNOSIS — Z7901 Long term (current) use of anticoagulants: Secondary | ICD-10-CM | POA: Diagnosis not present

## 2019-05-01 ENCOUNTER — Other Ambulatory Visit: Payer: Self-pay | Admitting: Family

## 2019-05-01 DIAGNOSIS — B0229 Other postherpetic nervous system involvement: Secondary | ICD-10-CM

## 2019-05-01 MED ORDER — METHADONE HCL 10 MG PO TABS: 10.0000 mg | ORAL_TABLET | Freq: Three times a day (TID) | ORAL | 0 refills | Status: DC

## 2019-05-01 NOTE — Progress Notes (Signed)
Called from pharmacy that they do not have any methadone in stock. Pt is completely out. [facility] [address], [place] does [facility] this in stock. I will send 5 days of rx to be filled until patient's PCP returns.

## 2019-05-04 ENCOUNTER — Other Ambulatory Visit: Payer: Self-pay | Admitting: Family Medicine

## 2019-05-04 MED ORDER — METHADONE HCL 10 MG PO TABS: 10.0000 mg | ORAL_TABLET | Freq: Three times a day (TID) | ORAL | 0 refills | Status: DC

## 2019-05-04 NOTE — Progress Notes (Signed)
I sent in a replacement for the methadone scrip. She will need to be seen by 06/02/19 for any further refills.

## 2019-05-07 ENCOUNTER — Other Ambulatory Visit: Payer: Self-pay | Admitting: Family Medicine

## 2019-05-13 ENCOUNTER — Ambulatory Visit (INDEPENDENT_AMBULATORY_CARE_PROVIDER_SITE_OTHER): Payer: Medicare HMO | Admitting: Family Medicine

## 2019-05-13 ENCOUNTER — Encounter: Payer: Self-pay | Admitting: Family Medicine

## 2019-05-13 ENCOUNTER — Other Ambulatory Visit: Payer: Self-pay

## 2019-05-13 VITALS — BP 106/62 | HR 78 | Temp 97.5°F | Resp 22 | Ht 61.0 in | Wt 152.2 lb

## 2019-05-13 DIAGNOSIS — I252 Old myocardial infarction: Secondary | ICD-10-CM | POA: Diagnosis not present

## 2019-05-13 DIAGNOSIS — B0229 Other postherpetic nervous system involvement: Secondary | ICD-10-CM

## 2019-05-13 DIAGNOSIS — Z0289 Encounter for other administrative examinations: Secondary | ICD-10-CM | POA: Diagnosis not present

## 2019-05-13 DIAGNOSIS — I5020 Unspecified systolic (congestive) heart failure: Secondary | ICD-10-CM | POA: Diagnosis not present

## 2019-05-13 DIAGNOSIS — R0602 Shortness of breath: Secondary | ICD-10-CM

## 2019-05-13 DIAGNOSIS — F1120 Opioid dependence, uncomplicated: Secondary | ICD-10-CM

## 2019-05-13 DIAGNOSIS — I480 Paroxysmal atrial fibrillation: Secondary | ICD-10-CM | POA: Diagnosis not present

## 2019-05-13 DIAGNOSIS — R601 Generalized edema: Secondary | ICD-10-CM

## 2019-05-13 MED ORDER — METHADONE HCL 10 MG PO TABS: 10.0000 mg | ORAL_TABLET | Freq: Three times a day (TID) | ORAL | 0 refills | Status: DC

## 2019-05-13 MED ORDER — RIVAROXABAN 15 MG PO TABS: 15.0000 mg | ORAL_TABLET | Freq: Every day | ORAL | 2 refills | Status: DC

## 2019-05-13 MED ORDER — FUROSEMIDE 40 MG PO TABS: 40.0000 mg | ORAL_TABLET | Freq: Two times a day (BID) | ORAL | 0 refills | Status: DC

## 2019-05-13 NOTE — Progress Notes (Signed)
Subjective:  Patient ID: Sheena Morrow, female    DOB: [birth year]  Age: 86 y.o. MRN: [id number]  CC: Medical Management of Chronic Issues   HPI Sheena Morrow presents for Pain assessment: Cause of pain- post herpetic neuralgia Pain location- midback Pain on scale of 1-10- 10/10 without med Frequency- constant What increases pain-missing med What makes pain Better- only medication Effects on ADL - debilitating Any change in general medical condition-none  Current opioids rx- methadone 10 m TID # meds rx- 90/month Effectiveness of current meds-excellent Adverse reactions form pain meds-none (constipatino in past treated with med) Morphine equivalent- 90  Pill count performed-No Last drug screen - 11/11/2018 ( high risk q50m moderate risk q620mlow risk yearly ) Urine drug screen today- Yes [place] [facility] [place] [place]  If yes were their any concerning findings? - no   Overdose risk: low No flowsheet data found.   Pain contract signed onBS:JGGEZ [facility] 05/13/2019 02/12/2019 11/11/2018  Decreased Interest 0 0 0  Down, Depressed, Hopeless 0 0 0  PHQ - 2 Score 0 0 0    History Vi[place]orias a past medical history of Clotting disorder ([place] Constipation due to opioid therapy, Old MI (myocardial infarction) (07/21/2015), Osteoporosis, Postherpetic neuralgia, and Systolic CHF with reduced left ventricular function, NYHA class 2 ([place](05/21/2015).   She has no past surgical history on file.   Her family history is not on file.She reports that she quit smoking about 11 years ago. Her smoking use included cigarettes. She started smoking about 50 years ago. She smoked 0.50 packs per day. She has never used smokeless tobacco. She reports that she does not drink alcohol or use drugs.    ROS Review of Systems  Constitutional: Negative.   HENT: Negative for congestion.   Eyes: Negative for visual disturbance.  Respiratory: Negative for shortness of breath.    Cardiovascular: Negative for chest pain.  Gastrointestinal: Negative for abdominal pain, constipation, diarrhea, nausea and vomiting.  Genitourinary: Negative for difficulty urinating.  Musculoskeletal: Negative for arthralgias and myalgias.  Neurological: Negative for headaches.  Psychiatric/Behavioral: Negative for sleep disturbance.    Objective:  BP 106/62   Pulse 78   Temp (!) 97.5 F (36.4 C) (Oral)   Resp (!) 22   Ht '5\' 1"'  (1.549 m)   Wt 152 lb 4 oz (69.1 kg)   SpO2 97%   BMI 28.77 kg/m   BP Readings from Last 3 Encounters:  05/13/19 106/62  02/12/19 123/65  11/11/18 119/66    Wt Readings from Last 3 Encounters:  05/13/19 152 lb 4 oz (69.1 kg)  02/12/19 148 lb 3.2 oz (67.2 kg)  11/11/18 150 lb (68 kg)     Physical Exam Constitutional:      General: She is not in acute distress.    Appearance: She is well-developed.  HENT:     Head: Normocephalic and atraumatic.     Right Ear: External ear normal.     Left Ear: External ear normal.  Cardiovascular:     Rate and Rhythm: Normal rate and regular rhythm.     Heart sounds: Normal heart sounds. No murmur.  Pulmonary:     Effort: Pulmonary effort is normal. No respiratory distress.     Breath sounds: Normal breath sounds. No wheezing or rales.  Abdominal:     Palpations: Abdomen is soft.     Tenderness: There is no abdominal tenderness.  Musculoskeletal:        General:  No tenderness. Normal range of motion.     Cervical back: Normal range of motion and neck supple.  Skin:    General: Skin is warm and dry.  Neurological:     Mental Status: She is alert and oriented to person, place, and time.     Motor: No abnormal muscle tone.     Coordination: Coordination normal.  Psychiatric:        Behavior: Behavior normal.       Assessment & Plan:   Sheena Morrow was seen today for medical management of chronic issues.  Diagnoses and all orders for this visit:  Pain medication agreement signed  Uncomplicated  opioid dependence ([place]) -     ToxASSURE Select 13 (MW), Urine  Postherpetic neuralgia -     CBC with Differential/Platelet -     CMP14+EGFR -     ToxASSURE Select 13 (MW), Urine -     methadone (DOLOPHINE) 10 MG tablet; Take 1 tablet (10 mg total) by mouth 3 (three) times daily. -     methadone (DOLOPHINE) 10 MG tablet; Take 1 tablet (10 mg total) by mouth 3 (three) times daily.  History of acute inferior wall MI -     CBC with Differential/Platelet -     CMP14+EGFR  Paroxysmal atrial fibrillation (HCC) -     CBC with Differential/Platelet -     PZW25+ENID  Systolic CHF with reduced left ventricular function, NYHA class 2 (HCC) -     CBC with Differential/Platelet -     CMP14+EGFR -     Brain natriuretic peptide  Generalized edema -     CMP14+EGFR -     Brain natriuretic peptide  Shortness of breath -     Brain natriuretic peptide  Other orders -     methadone (DOLOPHINE) 10 MG tablet; Take 1 tablet (10 mg total) by mouth 3 (three) times daily. -     furosemide (LASIX) 40 MG tablet; Take 1 tablet (40 mg total) by mouth 2 (two) times daily. For swelling -     Rivaroxaban (XARELTO) 15 MG TABS tablet; Take 1 tablet (15 mg total) by mouth daily.   Results for orders placed or performed in visit on 05/13/19  CBC with Differential/Platelet  Result Value Ref Range   WBC 5.4 3.4 - 10.8 x10E3/uL   RBC 1.91 (LL) 3.77 - 5.28 x10E6/uL   Hemoglobin 5.4 (LL) 11.1 - 15.9 g/dL   Hematocrit 18.7 (L) 34.0 - 46.6 %   MCV 98 (H) 79 - 97 fL   MCH 28.3 26.6 - 33.0 pg   MCHC 28.9 (L) 31.5 - 35.7 g/dL   RDW 14.4 11.7 - 15.4 %   Platelets 271 150 - 450 x10E3/uL   Neutrophils 61 Not Estab. %   Lymphs 27 Not Estab. %   Monocytes 8 Not Estab. %   Eos 3 Not Estab. %   Basos 1 Not Estab. %   Neutrophils Absolute 3.3 1.4 - 7.0 x10E3/uL   Lymphocytes Absolute 1.5 0.7 - 3.1 x10E3/uL   Monocytes Absolute 0.4 0.1 - 0.9 x10E3/uL   EOS (ABSOLUTE) 0.2 0.0 - 0.4 x10E3/uL   Basophils Absolute 0.1  0.0 - 0.2 x10E3/uL   Immature Granulocytes 0 Not Estab. %   Immature Grans (Abs) 0.0 0.0 - 0.1 x10E3/uL  CMP14+EGFR  Result Value Ref Range   Glucose 80 65 - 99 mg/dL   BUN 28 (H) 8 - 27 mg/dL   Creatinine, Ser 1.54 (H) 0.57 - 1.00 mg/dL  GFR calc non Af Amer 31 (L) >59 mL/min/1.73   GFR calc Af Amer 35 (L) >59 mL/min/1.73   BUN/Creatinine Ratio 18 12 - 28   Sodium 136 134 - 144 mmol/L   Potassium 3.9 3.5 - 5.2 mmol/L   Chloride 97 96 - 106 mmol/L   CO2 23 20 - 29 mmol/L   Calcium 8.7 8.7 - 10.3 mg/dL   Total Protein 6.5 6.0 - 8.5 g/dL   Albumin 3.7 3.6 - 4.6 g/dL   Globulin, Total 2.8 1.5 - 4.5 g/dL   Albumin/Globulin Ratio 1.3 1.2 - 2.2   Bilirubin Total 0.4 0.0 - 1.2 mg/dL   Alkaline Phosphatase 89 39 - 117 IU/L   AST 36 0 - 40 IU/L   ALT 13 0 - 32 IU/L        I have changed Chaney Malling Xarelto to Rivaroxaban. I have also changed her furosemide. I am also having her maintain her nitroGLYCERIN, triamcinolone cream, polyethylene glycol powder, denosumab, Ferrous Fumarate, atorvastatin, esomeprazole, lisinopril, montelukast, Restasis, diltiazem, Linzess, gabapentin, methadone, methadone, and methadone.  Allergies as of 05/13/2019      Reactions   Nsaids Other (See Comments)   GI Bleed      Medication List       Accurate as of May 13, 2019 11:59 PM. If you have any questions, ask your nurse or doctor.        atorvastatin 20 MG tablet Commonly known as: LIPITOR Take 1 tablet (20 mg total) by mouth daily.   denosumab 60 MG/ML Sosy injection Commonly known as: PROLIA Inject into the skin.   diltiazem 120 MG 24 hr capsule Commonly known as: Cardizem CD Take 1 capsule (120 mg total) by mouth daily. For blood pressure   esomeprazole 40 MG capsule Commonly known as: NEXIUM TAKE 1 CAPSULE BY MOUTH EVERY DAY   Ferrous Fumarate 324 (106 Fe) MG Tabs tablet Commonly known as: HEMOCYTE - 106 mg FE Take 1 tablet (106 mg of iron total) by mouth 2 (two) times  daily. For iron deficiency anemia   furosemide 40 MG tablet Commonly known as: LASIX Take 1 tablet (40 mg total) by mouth 2 (two) times daily. For swelling What changed:   medication strength  how much to take  when to take this  reasons to take this Changed by: Claretta Fraise, MD   gabapentin 800 MG tablet Commonly known as: NEURONTIN TAKE 1 TABLET EACH MORNING,TAKE 1 TABLET EACH AFTERNOON & 2 TABLETS EACH EVENING   Linzess 290 MCG Caps capsule Generic drug: linaclotide TAKE 1 CAPSULE (290 MCG TOTAL) BY MOUTH DAILY. TO REGULATE BOWEL MOVEMENTS   lisinopril 5 MG tablet Commonly known as: ZESTRIL Take 1 tablet (5 mg total) by mouth daily.   methadone 10 MG tablet Commonly known as: Dolophine Take 1 tablet (10 mg total) by mouth 3 (three) times daily. Start taking on: Jun 03, 2019 What changed: These instructions start on Jun 03, 2019. If you are unsure what to do until then, ask your doctor or other care provider. Changed by: Claretta Fraise, MD   methadone 10 MG tablet Commonly known as: Dolophine Take 1 tablet (10 mg total) by mouth 3 (three) times daily. Start taking on: July 03, 2019 What changed: These instructions start on July 03, 2019. If you are unsure what to do until then, ask your doctor or other care provider. Changed by: Claretta Fraise, MD   methadone 10 MG tablet Commonly known as: Dolophine Take 1 tablet (10  mg total) by mouth 3 (three) times daily. Start taking on: August 01, 2019 What changed: These instructions start on August 01, 2019. If you are unsure what to do until then, ask your doctor or other care provider. Changed by: Claretta Fraise, MD   montelukast 10 MG tablet Commonly known as: SINGULAIR TAKE 1 TABLET BY MOUTH EVERYDAY AT BEDTIME   nitroGLYCERIN 0.4 MG SL tablet Commonly known as: NITROSTAT Place under the tongue.   polyethylene glycol powder 17 GM/SCOOP powder Commonly known as: GLYCOLAX/MIRALAX USE 1 DOSING CUP ORALLY IN 8 OZ. OF WATER  DAILY   Restasis 0.05 % ophthalmic emulsion Generic drug: cycloSPORINE   Rivaroxaban 15 MG Tabs tablet Commonly known as: Xarelto Take 1 tablet (15 mg total) by mouth daily. What changed: how much to take Changed by: Claretta Fraise, MD   triamcinolone cream 0.1 % Commonly known as: KENALOG APPLY TOPICALLY 2 (TWO) TIMES DAILY. TO THE SCALP AS NEEDED      4/15 Severe anemia noted on labs. Explains dyspnea. Referred to E.D. urgently  Follow-up: Return in about 3 months (around 08/12/2019).  Claretta Fraise, M.D.

## 2019-05-14 ENCOUNTER — Encounter: Payer: Self-pay | Admitting: Family Medicine

## 2019-05-14 DIAGNOSIS — L120 Bullous pemphigoid: Secondary | ICD-10-CM | POA: Diagnosis not present

## 2019-05-14 DIAGNOSIS — R413 Other amnesia: Secondary | ICD-10-CM | POA: Insufficient documentation

## 2019-05-14 DIAGNOSIS — E8771 Transfusion associated circulatory overload: Secondary | ICD-10-CM | POA: Diagnosis not present

## 2019-05-14 DIAGNOSIS — B0229 Other postherpetic nervous system involvement: Secondary | ICD-10-CM | POA: Diagnosis not present

## 2019-05-14 DIAGNOSIS — N183 Chronic kidney disease, stage 3 unspecified: Secondary | ICD-10-CM | POA: Diagnosis not present

## 2019-05-14 DIAGNOSIS — D649 Anemia, unspecified: Secondary | ICD-10-CM | POA: Diagnosis not present

## 2019-05-14 DIAGNOSIS — I252 Old myocardial infarction: Secondary | ICD-10-CM | POA: Diagnosis not present

## 2019-05-14 DIAGNOSIS — Z20822 Contact with and (suspected) exposure to covid-19: Secondary | ICD-10-CM | POA: Diagnosis not present

## 2019-05-14 DIAGNOSIS — R0602 Shortness of breath: Secondary | ICD-10-CM | POA: Diagnosis not present

## 2019-05-14 DIAGNOSIS — D62 Acute posthemorrhagic anemia: Secondary | ICD-10-CM | POA: Diagnosis not present

## 2019-05-14 DIAGNOSIS — I5022 Chronic systolic (congestive) heart failure: Secondary | ICD-10-CM | POA: Diagnosis not present

## 2019-05-14 DIAGNOSIS — I482 Chronic atrial fibrillation, unspecified: Secondary | ICD-10-CM | POA: Diagnosis not present

## 2019-05-14 DIAGNOSIS — D509 Iron deficiency anemia, unspecified: Secondary | ICD-10-CM | POA: Diagnosis not present

## 2019-05-14 DIAGNOSIS — Z7901 Long term (current) use of anticoagulants: Secondary | ICD-10-CM | POA: Diagnosis not present

## 2019-05-14 DIAGNOSIS — N184 Chronic kidney disease, stage 4 (severe): Secondary | ICD-10-CM | POA: Diagnosis not present

## 2019-05-14 DIAGNOSIS — K31811 Angiodysplasia of stomach and duodenum with bleeding: Secondary | ICD-10-CM | POA: Diagnosis not present

## 2019-05-14 DIAGNOSIS — R000 Tachycardia, unspecified: Secondary | ICD-10-CM | POA: Diagnosis not present

## 2019-05-14 DIAGNOSIS — I110 Hypertensive heart disease with heart failure: Secondary | ICD-10-CM | POA: Diagnosis not present

## 2019-05-14 DIAGNOSIS — D539 Nutritional anemia, unspecified: Secondary | ICD-10-CM | POA: Insufficient documentation

## 2019-05-14 DIAGNOSIS — I5020 Unspecified systolic (congestive) heart failure: Secondary | ICD-10-CM | POA: Diagnosis not present

## 2019-05-14 DIAGNOSIS — R918 Other nonspecific abnormal finding of lung field: Secondary | ICD-10-CM | POA: Diagnosis not present

## 2019-05-14 DIAGNOSIS — J90 Pleural effusion, not elsewhere classified: Secondary | ICD-10-CM | POA: Diagnosis not present

## 2019-05-14 DIAGNOSIS — K222 Esophageal obstruction: Secondary | ICD-10-CM | POA: Diagnosis not present

## 2019-05-14 DIAGNOSIS — K449 Diaphragmatic hernia without obstruction or gangrene: Secondary | ICD-10-CM | POA: Diagnosis not present

## 2019-05-14 DIAGNOSIS — K922 Gastrointestinal hemorrhage, unspecified: Secondary | ICD-10-CM | POA: Diagnosis not present

## 2019-05-14 DIAGNOSIS — I130 Hypertensive heart and chronic kidney disease with heart failure and stage 1 through stage 4 chronic kidney disease, or unspecified chronic kidney disease: Secondary | ICD-10-CM | POA: Diagnosis not present

## 2019-05-14 DIAGNOSIS — I509 Heart failure, unspecified: Secondary | ICD-10-CM | POA: Diagnosis not present

## 2019-05-14 LAB — CBC WITH DIFFERENTIAL/PLATELET
Basophils Absolute: 0.1 10*3/uL (ref 0.0–0.2)
Basos: 1 %
EOS (ABSOLUTE): 0.2 10*3/uL (ref 0.0–0.4)
Eos: 3 %
Hematocrit: 18.7 % — ABNORMAL LOW (ref 34.0–46.6)
Hemoglobin: 5.4 g/dL — CL (ref 11.1–15.9)
Immature Grans (Abs): 0 10*3/uL (ref 0.0–0.1)
Immature Granulocytes: 0 %
Lymphocytes Absolute: 1.5 10*3/uL (ref 0.7–3.1)
Lymphs: 27 %
MCH: 28.3 pg (ref 26.6–33.0)
MCHC: 28.9 g/dL — ABNORMAL LOW (ref 31.5–35.7)
MCV: 98 fL — ABNORMAL HIGH (ref 79–97)
Monocytes Absolute: 0.4 10*3/uL (ref 0.1–0.9)
Monocytes: 8 %
Neutrophils Absolute: 3.3 10*3/uL (ref 1.4–7.0)
Neutrophils: 61 %
Platelets: 271 10*3/uL (ref 150–450)
RBC: 1.91 x10E6/uL — CL (ref 3.77–5.28)
RDW: 14.4 % (ref 11.7–15.4)
WBC: 5.4 10*3/uL (ref 3.4–10.8)

## 2019-05-14 LAB — CMP14+EGFR
ALT: 13 IU/L (ref 0–32)
AST: 36 IU/L (ref 0–40)
Albumin/Globulin Ratio: 1.3 (ref 1.2–2.2)
Albumin: 3.7 g/dL (ref 3.6–4.6)
Alkaline Phosphatase: 89 IU/L (ref 39–117)
BUN/Creatinine Ratio: 18 (ref 12–28)
BUN: 28 mg/dL — ABNORMAL HIGH (ref 8–27)
Bilirubin Total: 0.4 mg/dL (ref 0.0–1.2)
CO2: 23 mmol/L (ref 20–29)
Calcium: 8.7 mg/dL (ref 8.7–10.3)
Chloride: 97 mmol/L (ref 96–106)
Creatinine, Ser: 1.54 mg/dL — ABNORMAL HIGH (ref 0.57–1.00)
GFR calc Af Amer: 35 mL/min/{1.73_m2} — ABNORMAL LOW (ref >59)
GFR calc non Af Amer: 31 mL/min/{1.73_m2} — ABNORMAL LOW (ref >59)
Globulin, Total: 2.8 g/dL (ref 1.5–4.5)
Glucose: 80 mg/dL (ref 65–99)
Potassium: 3.9 mmol/L (ref 3.5–5.2)
Sodium: 136 mmol/L (ref 134–144)
Total Protein: 6.5 g/dL (ref 6.0–8.5)

## 2019-05-14 LAB — BRAIN NATRIURETIC PEPTIDE: BNP: 255.5 pg/mL — ABNORMAL HIGH (ref 0.0–100.0)

## 2019-05-15 LAB — TOXASSURE SELECT 13 (MW), URINE

## 2019-05-16 DIAGNOSIS — I5022 Chronic systolic (congestive) heart failure: Secondary | ICD-10-CM | POA: Diagnosis not present

## 2019-05-16 DIAGNOSIS — D649 Anemia, unspecified: Secondary | ICD-10-CM | POA: Diagnosis not present

## 2019-05-16 DIAGNOSIS — K449 Diaphragmatic hernia without obstruction or gangrene: Secondary | ICD-10-CM | POA: Diagnosis not present

## 2019-05-16 DIAGNOSIS — K222 Esophageal obstruction: Secondary | ICD-10-CM | POA: Diagnosis not present

## 2019-05-16 DIAGNOSIS — I130 Hypertensive heart and chronic kidney disease with heart failure and stage 1 through stage 4 chronic kidney disease, or unspecified chronic kidney disease: Secondary | ICD-10-CM | POA: Diagnosis not present

## 2019-05-16 DIAGNOSIS — Z20822 Contact with and (suspected) exposure to covid-19: Secondary | ICD-10-CM | POA: Diagnosis not present

## 2019-05-16 DIAGNOSIS — I482 Chronic atrial fibrillation, unspecified: Secondary | ICD-10-CM | POA: Diagnosis not present

## 2019-05-16 DIAGNOSIS — I110 Hypertensive heart disease with heart failure: Secondary | ICD-10-CM | POA: Diagnosis not present

## 2019-05-16 DIAGNOSIS — E8771 Transfusion associated circulatory overload: Secondary | ICD-10-CM | POA: Diagnosis not present

## 2019-05-16 DIAGNOSIS — N184 Chronic kidney disease, stage 4 (severe): Secondary | ICD-10-CM | POA: Diagnosis not present

## 2019-05-16 DIAGNOSIS — B0229 Other postherpetic nervous system involvement: Secondary | ICD-10-CM | POA: Diagnosis not present

## 2019-05-16 DIAGNOSIS — I509 Heart failure, unspecified: Secondary | ICD-10-CM | POA: Diagnosis not present

## 2019-05-16 DIAGNOSIS — D62 Acute posthemorrhagic anemia: Secondary | ICD-10-CM | POA: Diagnosis not present

## 2019-05-16 DIAGNOSIS — K31811 Angiodysplasia of stomach and duodenum with bleeding: Secondary | ICD-10-CM | POA: Diagnosis not present

## 2019-05-16 DIAGNOSIS — I252 Old myocardial infarction: Secondary | ICD-10-CM | POA: Diagnosis not present

## 2019-05-16 DIAGNOSIS — I5020 Unspecified systolic (congestive) heart failure: Secondary | ICD-10-CM | POA: Diagnosis not present

## 2019-05-16 DIAGNOSIS — L120 Bullous pemphigoid: Secondary | ICD-10-CM | POA: Diagnosis not present

## 2019-05-18 ENCOUNTER — Telehealth: Payer: Self-pay | Admitting: *Deleted

## 2019-05-18 MED ORDER — BAYER WOMENS 81-300 MG PO TABS
40.00 | ORAL_TABLET | ORAL | Status: DC
Start: 2019-05-17 — End: 2019-05-18

## 2019-05-18 MED ORDER — APAP PO
20.00 | ORAL | Status: DC
Start: 2019-05-17 — End: 2019-05-18

## 2019-05-18 MED ORDER — DILTIAZEM HCL ER BEADS 120 MG PO CP24
120.00 | ORAL_CAPSULE | ORAL | Status: DC
Start: 2019-05-18 — End: 2019-05-18

## 2019-05-18 MED ORDER — NEPHRO-VITE 0.8 MG PO TABS
1.00 | ORAL_TABLET | ORAL | Status: DC
Start: 2019-05-17 — End: 2019-05-18

## 2019-05-18 MED ORDER — LINACLOTIDE 290 MCG PO CAPS
290.00 | ORAL_CAPSULE | ORAL | Status: DC
Start: 2019-05-18 — End: 2019-05-18

## 2019-05-18 MED ORDER — CVS B-6 PO
17.00 | ORAL | Status: DC
Start: 2019-05-17 — End: 2019-05-18

## 2019-05-18 MED ORDER — GENERIC EXTERNAL MEDICATION
Status: DC
Start: ? — End: 2019-05-18

## 2019-05-18 MED ORDER — FE FUM-VIT C-VIT B12-FA 460-60-0.01-1 MG PO CAPS
10.00 | ORAL_CAPSULE | ORAL | Status: DC
Start: 2019-05-17 — End: 2019-05-18

## 2019-05-18 MED ORDER — FUROSEMIDE 40 MG PO TABS
40.00 | ORAL_TABLET | ORAL | Status: DC
Start: 2019-05-18 — End: 2019-05-18

## 2019-05-18 MED ORDER — Medication
1.00 | Status: DC
Start: ? — End: 2019-05-18

## 2019-05-18 MED ORDER — BARO-CAT PO
10.00 | ORAL | Status: DC
Start: ? — End: 2019-05-18

## 2019-05-18 MED ORDER — CHLOROBUTANOL-EUGENOL & APAP
0.40 | Status: DC
Start: ? — End: 2019-05-18

## 2019-05-18 MED ORDER — HORIZON PUMP SYRINGE ADD-ON MISC
10.00 | Status: DC
Start: 2019-05-17 — End: 2019-05-18

## 2019-05-18 MED ORDER — METOPROLOL SUCCINATE ER 25 MG PO TB24
25.00 | ORAL_TABLET | ORAL | Status: DC
Start: 2019-05-18 — End: 2019-05-18

## 2019-05-18 MED ORDER — Medication
Status: DC
Start: ? — End: 2019-05-18

## 2019-05-18 MED ORDER — OATMEAL BATH OILATED EX PACK
200.00 | PACK | CUTANEOUS | Status: DC
Start: ? — End: 2019-05-18

## 2019-05-18 MED ORDER — CHLOROPHYLL EX
20.00 | CUTANEOUS | Status: DC
Start: ? — End: 2019-05-18

## 2019-05-18 MED ORDER — GLYCERIN 50 % PO SOLN
ORAL | Status: DC
Start: 2019-05-17 — End: 2019-05-18

## 2019-05-18 MED ORDER — Medication
400.00 | Status: DC
Start: 2019-05-17 — End: 2019-05-18

## 2019-05-18 NOTE — Telephone Encounter (Signed)
    Transitional Care Management  Contact Attempt Attempt Date:05/18/2019 Attempted By: Zannie Cove, LPN    1st unsuccessful TCM contact attempt.   I reached out to Sheena Morrow on her preferred telephone number to discuss Transitional Care Management, medication reconciliation, and to schedule a TCM hospital follow-up with her PCP at [facility].  Discharge Date: 05/17/19 Location: [facility]   Discharge Dx: Anemia   Recommendations for Outpatient Follow-up:   (insert from discharge summary) In Care everywhere   Plan I left a HIPPA compliant message for her to return my call.  Will attempt to contact again within the 2 business day post discharge window if she does not return my call.

## 2019-05-19 NOTE — Telephone Encounter (Signed)
TRANSITIONAL CARE MANAGEMENT TELEPHONE OUTREACH NOTE   Contact Date: 05/19/2019 Contacted By: [place] INFORMATION Date of Discharge:05/17/2019 Discharge Facility: [facility] Principal Discharge [place]   Outpatient Follow Up Recommendations (copied from discharge summary) Follow up with Primary Care Physician:  As directed  Reason for referral:  iron deficiency anemia, hosptial follow up    Sheena Morrow is a female primary care patient of Claretta Fraise, MD. An outgoing telephone call was made today and I spoke with Sheena Morrow.  Ms. Schalk condition(s) and treatment(s) were discussed. An opportunity to ask questions was provided and all were answered or forwarded as appropriate.    ACTIVITIES OF DAILY LIVING  Sheena Morrow lives with their family(son) and she can perform ADLs independently. her primary caregiver is herself. she is able to depend on her primary caregiver(s) for consistent help. Transportation to appointments, to pick up medications, and to run errands is a problem.  (Consider referral to [facility] if transportation or a consistent caregiver is a problem)   Fall Risk Fall Risk  05/13/2019 02/12/2019  Falls in the past year? 1 0  Number falls in past yr: 0 0  Injury with Fall? 0 0  Risk for fall due to : History of fall(s);Impaired mobility History of fall(s);Impaired balance/gait  Follow up Falls evaluation completed -    low Sheena Morrow Modifications/Assistive Devices Wheelchair: No Cane: Yes Ramp: No Bedside Toilet: No Hospital Bed:  No Other:    [place] she is not receiving home health services.    MEDICATION RECONCILIATION  [name] has been able to pick-up all prescribed discharge medications from the pharmacy.   A post discharge medication reconciliation was performed and the complete medication list was reviewed with the patient/caregiver and is current as of 05/19/2019. Changes highlighted  below.  Discontinued Medications   Current Medication List Allergies as of 05/18/2019      Reactions   Nsaids Other (See Comments)   GI Bleed      Medication List       Accurate as of May 18, 2019 11:59 PM. If you have any questions, ask your nurse or doctor.        atorvastatin 20 MG tablet Commonly known as: LIPITOR Take 1 tablet (20 mg total) by mouth daily.   denosumab 60 MG/ML Sosy injection Commonly known as: PROLIA Inject into the skin.   diltiazem 120 MG 24 hr capsule Commonly known as: Cardizem CD Take 1 capsule (120 mg total) by mouth daily. For blood pressure   esomeprazole 40 MG capsule Commonly known as: NEXIUM TAKE 1 CAPSULE BY MOUTH EVERY DAY   Ferrous Fumarate 324 (106 Fe) MG Tabs tablet Commonly known as: HEMOCYTE - 106 mg FE Take 1 tablet (106 mg of iron total) by mouth 2 (two) times daily. For iron deficiency anemia   furosemide 40 MG tablet Commonly known as: LASIX Take 1 tablet (40 mg total) by mouth 2 (two) times daily. For swelling   gabapentin 800 MG tablet Commonly known as: NEURONTIN TAKE 1 TABLET EACH MORNING,TAKE 1 TABLET EACH AFTERNOON & 2 TABLETS EACH EVENING   Linzess 290 MCG Caps capsule Generic drug: linaclotide TAKE 1 CAPSULE (290 MCG TOTAL) BY MOUTH DAILY. TO REGULATE BOWEL MOVEMENTS   lisinopril 5 MG tablet Commonly known as: ZESTRIL Take 1 tablet (5 mg total) by mouth daily.   methadone 10 MG tablet Commonly known as: Dolophine Take 1 tablet (10 mg total) by  mouth 3 (three) times daily. Start taking on: Jun 03, 2019   methadone 10 MG tablet Commonly known as: Dolophine Take 1 tablet (10 mg total) by mouth 3 (three) times daily. Start taking on: July 03, 2019   methadone 10 MG tablet Commonly known as: Dolophine Take 1 tablet (10 mg total) by mouth 3 (three) times daily. Start taking on: August 01, 2019   montelukast 10 MG tablet Commonly known as: SINGULAIR TAKE 1 TABLET BY MOUTH EVERYDAY AT BEDTIME     nitroGLYCERIN 0.4 MG SL tablet Commonly known as: NITROSTAT Place under the tongue.   polyethylene glycol powder 17 GM/SCOOP powder Commonly known as: GLYCOLAX/MIRALAX USE 1 DOSING CUP ORALLY IN 8 OZ. OF WATER DAILY   Restasis 0.05 % ophthalmic emulsion Generic drug: cycloSPORINE   Rivaroxaban 15 MG Tabs tablet Commonly known as: Xarelto Take 1 tablet (15 mg total) by mouth daily.   triamcinolone cream 0.1 % Commonly known as: KENALOG APPLY TOPICALLY 2 (TWO) TIMES DAILY. TO THE SCALP AS NEEDED        PATIENT EDUCATION & FOLLOW-UP PLAN  An appointment for Transitional Care Management is scheduled with Claretta Fraise, MD on 05/27/2019 at 2:10.  Take all medications as prescribed  Contact our office by calling [phone] if you have any questions or concerns

## 2019-05-27 ENCOUNTER — Other Ambulatory Visit: Payer: Self-pay | Admitting: Family Medicine

## 2019-05-27 ENCOUNTER — Ambulatory Visit: Payer: Medicare HMO | Admitting: Family Medicine

## 2019-05-27 ENCOUNTER — Telehealth: Payer: Self-pay | Admitting: Family Medicine

## 2019-05-27 MED ORDER — DEXILANT 60 MG PO CPDR
60.0000 mg | DELAYED_RELEASE_CAPSULE | Freq: Every day | ORAL | 5 refills | Status: DC
Start: 2019-05-27 — End: 2019-06-24

## 2019-05-27 NOTE — Telephone Encounter (Signed)
I can change it to something else. Scrip wil be sent to her pharmacy.

## 2019-05-27 NOTE — Telephone Encounter (Signed)
Pt wants to let Dr Livia Snellen know that she cant take the Pantoprazole Rx because it makes her stomach hurt and makes her feel bloated. Wants advice.

## 2019-05-27 NOTE — Telephone Encounter (Signed)
Lmtcb.

## 2019-05-28 NOTE — Telephone Encounter (Signed)
Pt aware that new med was called into pharmacy. She will pick up today and will let us know if she has any side effects from it

## 2019-05-30 ENCOUNTER — Other Ambulatory Visit: Payer: Self-pay | Admitting: Family Medicine

## 2019-06-04 ENCOUNTER — Other Ambulatory Visit: Payer: Self-pay | Admitting: Family Medicine

## 2019-06-09 ENCOUNTER — Ambulatory Visit: Payer: Medicare HMO | Admitting: Family Medicine

## 2019-06-10 ENCOUNTER — Other Ambulatory Visit: Payer: Self-pay | Admitting: Family Medicine

## 2019-06-16 ENCOUNTER — Other Ambulatory Visit: Payer: Self-pay | Admitting: Family Medicine

## 2019-06-22 ENCOUNTER — Ambulatory Visit: Payer: Medicare HMO | Admitting: Family Medicine

## 2019-06-24 ENCOUNTER — Ambulatory Visit (INDEPENDENT_AMBULATORY_CARE_PROVIDER_SITE_OTHER): Payer: Medicare HMO | Admitting: Family Medicine

## 2019-06-24 ENCOUNTER — Encounter: Payer: Self-pay | Admitting: Family Medicine

## 2019-06-24 ENCOUNTER — Other Ambulatory Visit: Payer: Self-pay

## 2019-06-24 VITALS — BP 119/58 | HR 87 | Temp 97.4°F | Resp 20 | Ht 61.0 in | Wt 134.2 lb

## 2019-06-24 DIAGNOSIS — I480 Paroxysmal atrial fibrillation: Secondary | ICD-10-CM | POA: Diagnosis not present

## 2019-06-24 DIAGNOSIS — D509 Iron deficiency anemia, unspecified: Secondary | ICD-10-CM | POA: Diagnosis not present

## 2019-06-24 DIAGNOSIS — I252 Old myocardial infarction: Secondary | ICD-10-CM | POA: Diagnosis not present

## 2019-06-24 DIAGNOSIS — D500 Iron deficiency anemia secondary to blood loss (chronic): Secondary | ICD-10-CM | POA: Diagnosis not present

## 2019-06-24 DIAGNOSIS — I2510 Atherosclerotic heart disease of native coronary artery without angina pectoris: Secondary | ICD-10-CM | POA: Diagnosis not present

## 2019-06-24 DIAGNOSIS — I5020 Unspecified systolic (congestive) heart failure: Secondary | ICD-10-CM | POA: Diagnosis not present

## 2019-06-24 MED ORDER — ASPIRIN 81 MG PO TBEC: 81.0000 mg | DELAYED_RELEASE_TABLET | Freq: Every day | ORAL | 11 refills | Status: DC

## 2019-06-24 MED ORDER — RABEPRAZOLE SODIUM 20 MG PO TBEC: 20.0000 mg | DELAYED_RELEASE_TABLET | Freq: Every day | ORAL | 1 refills | Status: DC

## 2019-06-24 NOTE — Progress Notes (Signed)
Subjective:  Patient ID: Sheena Morrow, female    DOB: [birth year]  Age: 86 y.o. MRN: [id number]  CC: Hospitalization Follow-up   HPI Sheena Morrow presents for follow-up on her recent hospitalization for blood loss anemia due to GI bleed.  Of note is that she has been taking Xarelto for atrial fibrillation along with aspirin.  She has had an MI in the past and is currently treated for atrial fibrillation with rate control and anticoagulation.  She was seen in the office a few weeks ago.  She complained of weakness.  She was noted to be pale.  Subsequently her hemoglobin was found to be approximately 5.0 and she was called and sent to the emergency room.  She was admitted and received transfusions.  She is in today for follow-up of the anemia to recheck her hemoglobin.  She denies hematemesis or hematochezia and she denies melena.  She states her strength is improving although she remains weak  Depression screen [facility] 2/9 06/24/2019 05/13/2019 02/12/2019  Decreased Interest 0 0 0  Down, Depressed, Hopeless 0 0 0  PHQ - 2 Score 0 0 0    History Sheena Morrow has a past medical history of Clotting disorder ([place]), Constipation due to opioid therapy, Old MI (myocardial infarction) (07/21/2015), Osteoporosis, Postherpetic neuralgia, and Systolic CHF with reduced left ventricular function, NYHA class 2 ([place]) (05/21/2015).   She has no past surgical history on file.   Her family history is not on file.She reports that she quit smoking about 11 years ago. Her smoking use included cigarettes. She started smoking about 50 years ago. She smoked 0.50 packs per day. She has never used smokeless tobacco. She reports that she does not drink alcohol or use drugs.    ROS Review of Systems  Constitutional: Positive for activity change and fatigue.  HENT: Negative for congestion.   Eyes: Negative for visual disturbance.  Respiratory: Negative for shortness of breath.   Cardiovascular: Negative for chest pain.   Gastrointestinal: Negative for abdominal pain, constipation, diarrhea, nausea and vomiting.  Genitourinary: Negative for difficulty urinating.  Musculoskeletal: Negative for arthralgias and myalgias.  Neurological: Positive for weakness (nonfocal). Negative for headaches.  Psychiatric/Behavioral: Negative for sleep disturbance.    Objective:  BP (!) 119/58   Pulse 87   Temp (!) 97.4 F (36.3 C) (Temporal)   Resp 20   Ht '5\' 1"'  (1.549 m)   Wt 134 lb 4 oz (60.9 kg)   SpO2 97%   BMI 25.37 kg/m   BP Readings from Last 3 Encounters:  06/24/19 (!) 119/58  05/13/19 106/62  02/12/19 123/65    Wt Readings from Last 3 Encounters:  06/24/19 134 lb 4 oz (60.9 kg)  05/13/19 152 lb 4 oz (69.1 kg)  02/12/19 148 lb 3.2 oz (67.2 kg)     Physical Exam Constitutional:      General: She is not in acute distress.    Appearance: She is well-developed.  HENT:     Head: Normocephalic and atraumatic.  Eyes:     Conjunctiva/sclera: Conjunctivae normal.     Pupils: Pupils are equal, round, and reactive to light.  Neck:     Thyroid: No thyromegaly.  Cardiovascular:     Rate and Rhythm: Normal rate and regular rhythm.     Heart sounds: Normal heart sounds. No murmur.  Pulmonary:     Effort: Pulmonary effort is normal. No respiratory distress.     Breath sounds: Normal breath sounds. No wheezing or rales.  Abdominal:  General: Bowel sounds are normal. There is no distension.     Palpations: Abdomen is soft.     Tenderness: There is no abdominal tenderness.  Musculoskeletal:        General: Normal range of motion.     Cervical back: Normal range of motion and neck supple.  Lymphadenopathy:     Cervical: No cervical adenopathy.  Skin:    General: Skin is warm and dry.  Neurological:     Mental Status: She is alert and oriented to person, place, and time.  Psychiatric:        Behavior: Behavior normal.        Thought Content: Thought content normal.        Judgment: Judgment  normal.       Assessment & Plan:   Tejal was seen today for hospitalization follow-up.  Diagnoses and all orders for this visit:  Paroxysmal atrial fibrillation ([place]) -     CMP14+EGFR -     CBC with Differential/Platelet  Systolic CHF with reduced left ventricular function, NYHA class 2 (H[facility]) -     CMP14+EGFR -     CBC with Differential/Platelet  Iron deficiency anemia due to chronic blood loss -     CMP14+EGFR -     CBC with Differential/Platelet  CAD in native artery  History of acute inferior wall MI  Other orders -     RABEprazole (ACIPHEX) 20 MG tablet; Take 1 tablet (20 mg total) by mouth daily. -     aspirin EC 81 MG EC tablet; Take 1 tablet (81 mg total) by mouth daily.   She has trouble tolerating PPI medications.  She has not been trialed on AcipHex.  She tells me today that the Dexilant caused diarrhea.  Both Nexium and pantoprazole have caused nausea bloating and GI distress.  I like for her to try AcipHex due to the recent GI bleed.  However under the circumstances I think it is important that she discontinue the rivaroxaban.  Based on the severity of the anemia the only blood thinner I can recommend is low-dose aspirin.  Even with this will need to follow her hemoglobin closely.    I have discontinued Chaney Malling Rivaroxaban and Dexilant. I am also having her start on RABEprazole and aspirin. Additionally, I am having her maintain her nitroGLYCERIN, triamcinolone cream, polyethylene glycol powder, denosumab, Ferrous Fumarate, esomeprazole, lisinopril, montelukast, Restasis, diltiazem, Linzess, gabapentin, methadone, methadone, methadone, atorvastatin, and furosemide.  Allergies as of 06/24/2019      Reactions   Nsaids Other (See Comments)   GI Bleed   Dexilant [dexlansoprazole] Diarrhea   Pantoprazole       Medication List       Accurate as of Jun 24, 2019 11:59 PM. If you have any questions, ask your nurse or doctor.        STOP taking these  medications   Dexilant 60 MG capsule Generic drug: dexlansoprazole Stopped by: Claretta Fraise, MD   Rivaroxaban 15 MG Tabs tablet Commonly known as: Xarelto Stopped by: Claretta Fraise, MD     TAKE these medications   aspirin 81 MG EC tablet Take 1 tablet (81 mg total) by mouth daily. Started by: Claretta Fraise, MD   atorvastatin 20 MG tablet Commonly known as: LIPITOR TAKE 1 TABLET BY MOUTH EVERY DAY   denosumab 60 MG/ML Sosy injection Commonly known as: PROLIA Inject into the skin.   diltiazem 120 MG 24 hr capsule Commonly known as: Cardizem CD Take 1  capsule (120 mg total) by mouth daily. For blood pressure   esomeprazole 40 MG capsule Commonly known as: NEXIUM TAKE 1 CAPSULE BY MOUTH EVERY DAY   Ferrous Fumarate 324 (106 Fe) MG Tabs tablet Commonly known as: HEMOCYTE - 106 mg FE Take 1 tablet (106 mg of iron total) by mouth 2 (two) times daily. For iron deficiency anemia   furosemide 40 MG tablet Commonly known as: LASIX TAKE 1 TABLET BY MOUTH 2 TIMES DAILY FOR SWELLING   gabapentin 800 MG tablet Commonly known as: NEURONTIN TAKE 1 TABLET EACH MORNING,TAKE 1 TABLET EACH AFTERNOON & 2 TABLETS EACH EVENING   Linzess 290 MCG Caps capsule Generic drug: linaclotide TAKE 1 CAPSULE (290 MCG TOTAL) BY MOUTH DAILY. TO REGULATE BOWEL MOVEMENTS   lisinopril 5 MG tablet Commonly known as: ZESTRIL Take 1 tablet (5 mg total) by mouth daily.   methadone 10 MG tablet Commonly known as: Dolophine Take 1 tablet (10 mg total) by mouth 3 (three) times daily.   methadone 10 MG tablet Commonly known as: Dolophine Take 1 tablet (10 mg total) by mouth 3 (three) times daily. Start taking on: July 03, 2019   methadone 10 MG tablet Commonly known as: Dolophine Take 1 tablet (10 mg total) by mouth 3 (three) times daily. Start taking on: August 01, 2019   montelukast 10 MG tablet Commonly known as: SINGULAIR TAKE 1 TABLET BY MOUTH EVERYDAY AT BEDTIME   nitroGLYCERIN 0.4 MG SL  tablet Commonly known as: NITROSTAT Place under the tongue.   polyethylene glycol powder 17 GM/SCOOP powder Commonly known as: GLYCOLAX/MIRALAX USE 1 DOSING CUP ORALLY IN 8 OZ. OF WATER DAILY   RABEprazole 20 MG tablet Commonly known as: ACIPHEX Take 1 tablet (20 mg total) by mouth daily. Started by: Claretta Fraise, MD   Restasis 0.05 % ophthalmic emulsion Generic drug: cycloSPORINE   triamcinolone cream 0.1 % Commonly known as: KENALOG APPLY TOPICALLY 2 (TWO) TIMES DAILY. TO THE SCALP AS NEEDED        Follow-up: Return in about 2 months (around 08/24/2019).  Claretta Fraise, M.D.

## 2019-06-25 ENCOUNTER — Other Ambulatory Visit: Payer: Self-pay | Admitting: Family Medicine

## 2019-06-25 DIAGNOSIS — D509 Iron deficiency anemia, unspecified: Secondary | ICD-10-CM

## 2019-06-25 LAB — CBC WITH DIFFERENTIAL/PLATELET
Basophils Absolute: 0.1 10*3/uL (ref 0.0–0.2)
Basos: 1 %
EOS (ABSOLUTE): 0.4 10*3/uL (ref 0.0–0.4)
Eos: 7 %
Hematocrit: 28.6 % — ABNORMAL LOW (ref 34.0–46.6)
Hemoglobin: 8.8 g/dL — CL (ref 11.1–15.9)
Immature Grans (Abs): 0 10*3/uL (ref 0.0–0.1)
Immature Granulocytes: 0 %
Lymphocytes Absolute: 1.8 10*3/uL (ref 0.7–3.1)
Lymphs: 34 %
MCH: 29.4 pg (ref 26.6–33.0)
MCHC: 30.8 g/dL — ABNORMAL LOW (ref 31.5–35.7)
MCV: 96 fL (ref 79–97)
Monocytes Absolute: 0.6 10*3/uL (ref 0.1–0.9)
Monocytes: 11 %
Neutrophils Absolute: 2.4 10*3/uL (ref 1.4–7.0)
Neutrophils: 47 %
Platelets: 247 10*3/uL (ref 150–450)
RBC: 2.99 x10E6/uL — ABNORMAL LOW (ref 3.77–5.28)
RDW: 20.1 % — ABNORMAL HIGH (ref 11.7–15.4)
WBC: 5.2 10*3/uL (ref 3.4–10.8)

## 2019-06-25 LAB — CMP14+EGFR
ALT: 17 IU/L (ref 0–32)
AST: 44 IU/L — ABNORMAL HIGH (ref 0–40)
Albumin/Globulin Ratio: 1.3 (ref 1.2–2.2)
Albumin: 3.8 g/dL (ref 3.6–4.6)
Alkaline Phosphatase: 87 IU/L (ref 48–121)
BUN/Creatinine Ratio: 17 (ref 12–28)
BUN: 27 mg/dL (ref 8–27)
Bilirubin Total: 0.3 mg/dL (ref 0.0–1.2)
CO2: 31 mmol/L — ABNORMAL HIGH (ref 20–29)
Calcium: 8.9 mg/dL (ref 8.7–10.3)
Chloride: 97 mmol/L (ref 96–106)
Creatinine, Ser: 1.56 mg/dL — ABNORMAL HIGH (ref 0.57–1.00)
GFR calc Af Amer: 35 mL/min/{1.73_m2} — ABNORMAL LOW (ref >59)
GFR calc non Af Amer: 30 mL/min/{1.73_m2} — ABNORMAL LOW (ref >59)
Globulin, Total: 3 g/dL (ref 1.5–4.5)
Glucose: 88 mg/dL (ref 65–99)
Potassium: 3.5 mmol/L (ref 3.5–5.2)
Sodium: 141 mmol/L (ref 134–144)
Total Protein: 6.8 g/dL (ref 6.0–8.5)

## 2019-06-26 ENCOUNTER — Encounter: Payer: Self-pay | Admitting: Family Medicine

## 2019-06-26 DIAGNOSIS — D500 Iron deficiency anemia secondary to blood loss (chronic): Secondary | ICD-10-CM | POA: Insufficient documentation

## 2019-07-01 ENCOUNTER — Ambulatory Visit (INDEPENDENT_AMBULATORY_CARE_PROVIDER_SITE_OTHER): Payer: Medicare HMO | Admitting: *Deleted

## 2019-07-01 DIAGNOSIS — Z0000 Encounter for general adult medical examination without abnormal findings: Secondary | ICD-10-CM | POA: Diagnosis not present

## 2019-07-01 NOTE — Progress Notes (Signed)
MEDICARE ANNUAL WELLNESS VISIT  07/01/2019  Telephone Visit Disclaimer This Medicare AWV was conducted by telephone due to national recommendations for restrictions regarding the COVID-19 Pandemic (e.g. social distancing).  I verified, using two identifiers, that I am speaking with Sheena Morrow or their authorized healthcare agent. I discussed the limitations, risks, security, and privacy concerns of performing an evaluation and management service by telephone and the potential availability of an in-person appointment in the future. The patient expressed understanding and agreed to proceed.   Subjective:  Sheena Morrow is a 86 y.o. female patient of Stacks, Cletus Gash, MD who had a Medicare Annual Wellness Visit today via telephone. Sheena Morrow is retired. She has one son that resides with her.  She reports that she is socially active and does interact with friends/family regularly.  She is not physically active and enjoys reading.  Patient Care Team: Claretta Fraise, MD as PCP - General (Family Medicine)  Advanced Directives 07/01/2019 06/19/2018  Does Patient Have a Medical Advance Directive? No No  Would patient like information on creating a medical advance directive? No - Patient declined Yes (MAU/Ambulatory/Procedural Areas - Information given)    Hospital Utilization Over the Past 12 Months: # of hospitalizations or ER visits: 2 # of surgeries: 0  Review of Systems    Patient reports that her overall health is worse compared to last year.  History obtained from the patient.  Patient Reported Readings (BP, Pulse, CBG, Weight, etc) none  Pain Assessment Pain : No/denies pain     Current Medications & Allergies (verified) Allergies as of 07/01/2019      Reactions   Nsaids Other (See Comments)   GI Bleed   Dexilant [dexlansoprazole] Diarrhea   Pantoprazole       Medication List       Accurate as of July 01, 2019 11:04 AM. If you have any questions, ask your nurse or doctor.          STOP taking these medications   denosumab 60 MG/ML Sosy injection Commonly known as: PROLIA   triamcinolone cream 0.1 % Commonly known as: KENALOG     TAKE these medications   aspirin 81 MG EC tablet Take 1 tablet (81 mg total) by mouth daily.   atorvastatin 20 MG tablet Commonly known as: LIPITOR TAKE 1 TABLET BY MOUTH EVERY DAY   Biotin 10000 MCG Tabs Take by mouth.   diltiazem 120 MG 24 hr capsule Commonly known as: Cardizem CD Take 1 capsule (120 mg total) by mouth daily. For blood pressure   esomeprazole 40 MG capsule Commonly known as: NEXIUM TAKE 1 CAPSULE BY MOUTH EVERY DAY   Ferrous Fumarate 324 (106 Fe) MG Tabs tablet Commonly known as: HEMOCYTE - 106 mg FE Take 1 tablet (106 mg of iron total) by mouth 2 (two) times daily. For iron deficiency anemia   furosemide 40 MG tablet Commonly known as: LASIX TAKE 1 TABLET BY MOUTH 2 TIMES DAILY FOR SWELLING   gabapentin 800 MG tablet Commonly known as: NEURONTIN TAKE 1 TABLET EACH MORNING,TAKE 1 TABLET EACH AFTERNOON & 2 TABLETS EACH EVENING   Linzess 290 MCG Caps capsule Generic drug: linaclotide TAKE 1 CAPSULE (290 MCG TOTAL) BY MOUTH DAILY. TO REGULATE BOWEL MOVEMENTS   lisinopril 5 MG tablet Commonly known as: ZESTRIL Take 1 tablet (5 mg total) by mouth daily.   methadone 10 MG tablet Commonly known as: Dolophine Take 1 tablet (10 mg total) by mouth 3 (three) times daily.  methadone 10 MG tablet Commonly known as: Dolophine Take 1 tablet (10 mg total) by mouth 3 (three) times daily. Start taking on: July 03, 2019   methadone 10 MG tablet Commonly known as: Dolophine Take 1 tablet (10 mg total) by mouth 3 (three) times daily. Start taking on: August 01, 2019   montelukast 10 MG tablet Commonly known as: SINGULAIR TAKE 1 TABLET BY MOUTH EVERYDAY AT BEDTIME   multivitamin tablet Take 1 tablet by mouth daily.   nitroGLYCERIN 0.4 MG SL tablet Commonly known as: NITROSTAT Place under the  tongue.   polyethylene glycol powder 17 GM/SCOOP powder Commonly known as: GLYCOLAX/MIRALAX USE 1 DOSING CUP ORALLY IN 8 OZ. OF WATER DAILY   RABEprazole 20 MG tablet Commonly known as: ACIPHEX Take 1 tablet (20 mg total) by mouth daily.   Restasis 0.05 % ophthalmic emulsion Generic drug: cycloSPORINE       History (reviewed): Past Medical History:  Diagnosis Date  . Clotting disorder ([place])   . Constipation due to opioid therapy   . Old MI (myocardial infarction) 07/21/2015  . Osteoporosis   . Postherpetic neuralgia   . Systolic CHF with reduced left ventricular function, NYHA class 2 ([place]) 05/21/2015   Past Surgical History:  Procedure Laterality Date  . HIP SURGERY     History reviewed. No pertinent family history. Social History   Socioeconomic History  . Marital status: Divorced    Spouse name: Not on file  . Number of children: 1  . Years of education: 12  . Highest education level: 11th grade  Occupational History  . Occupation: Retired  Tobacco Use  . Smoking status: Former Smoker    Packs/day: 0.50    Types: Cigarettes    Start date: 04/01/1969    Quit date: 04/01/2008    Years since quitting: 11.2  . Smokeless tobacco: Never Used  Substance and Sexual Activity  . Alcohol use: No    Alcohol/week: 0.0 standard drinks  . Drug use: No  . Sexual activity: Not Currently  Other Topics Concern  . Not on file  Social History Narrative  . Not on file   Social Determinants of Health   Financial Resource Strain:   . Difficulty of Paying Living Expenses:   Food Insecurity:   . Worried About Charity fundraiser in the Last Year:   . Arboriculturist in the Last Year:   Transportation Needs:   . Film/video editor (Medical):   Marland Kitchen Lack of Transportation (Non-Medical):   Physical Activity:   . Days of Exercise per Week:   . Minutes of Exercise per Session:   Stress:   . Feeling of Stress :   Social Connections:   . Frequency of Communication with Friends  and Family:   . Frequency of Social Gatherings with Friends and Family:   . Attends Religious Services:   . Active Member of Clubs or Organizations:   . Attends Archivist Meetings:   Marland Kitchen Marital Status:     Activities of Daily Living In your present state of health, do you have any difficulty performing the following activities: 07/01/2019  Hearing? Y  Vision? N  Difficulty concentrating or making decisions? N  Walking or climbing stairs? Y  Comment gives out of breath with stairs  Dressing or bathing? N  Doing errands, shopping? N  Preparing Food and eating ? N  Using the Toilet? N  In the past six months, have you accidently leaked urine? N  Do you have problems with loss of bowel control? N  Managing your Medications? N  Managing your Finances? N  Housekeeping or managing your Housekeeping? N  Some recent data might be hidden    Patient Education/ Literacy How often do you need to have someone help you when you read instructions, pamphlets, or other written materials from your doctor or pharmacy?: 1 - Never What is the last grade level you completed in school?: 11th  Exercise Current Exercise Habits: The patient does not participate in regular exercise at present, Exercise limited by: None identified  Diet Patient reports consuming 2 meals a day and 0 snack(s) a day Patient reports that her primary diet is: Regular Patient reports that she does have regular access to food.   Depression Screen PHQ 2/9 Scores 07/01/2019 06/24/2019 05/13/2019 02/12/2019 11/11/2018 06/19/2018 02/25/2018  PHQ - 2 Score 0 0 0 0 0 0 0     Fall Risk Fall Risk  07/01/2019 06/24/2019 05/13/2019 02/12/2019 11/11/2018  Falls in the past year? 1 1 1  0 0  Number falls in past yr: 0 0 0 0 -  Injury with Fall? 0 0 0 0 -  Risk for fall due to : History of fall(s);Impaired mobility History of fall(s);Impaired mobility History of fall(s);Impaired mobility History of fall(s);Impaired balance/gait -    Follow up Falls evaluation completed Falls evaluation completed Falls evaluation completed - -     Objective:  Sheena Morrow seemed alert and oriented and she participated appropriately during our telephone visit.  Blood Pressure Weight BMI  BP Readings from Last 3 Encounters:  06/24/19 (!) 119/58  05/13/19 106/62  02/12/19 123/65   Wt Readings from Last 3 Encounters:  06/24/19 134 lb 4 oz (60.9 kg)  05/13/19 152 lb 4 oz (69.1 kg)  02/12/19 148 lb 3.2 oz (67.2 kg)   BMI Readings from Last 1 Encounters:  06/24/19 25.37 kg/m    *Unable to obtain current vital signs, weight, and BMI due to telephone visit type  Hearing/Vision  . Sheena Morrow did not seem to have difficulty with hearing/understanding during the telephone conversation . Reports that she has had a formal eye exam by an eye care professional within the past year . Reports that she has not had a formal hearing evaluation within the past year *Unable to fully assess hearing and vision during telephone visit type  Cognitive Function: 6CIT Screen 07/01/2019 06/19/2018  What Year? 0 points 0 points  What month? 0 points 0 points  What time? 0 points 0 points  Count back from 20 0 points 0 points  Months in reverse 0 points 0 points  Repeat phrase 2 points 0 points  Total Score 2 0   (Normal:0-7, Significant for Dysfunction: >8)  Normal Cognitive Function Screening: Yes   Immunization & Health Maintenance Record Immunization History  Administered Date(s) Administered  . Fluad Quad(high Dose 65+) 11/11/2018  . Influenza, High Dose Seasonal PF 10/15/2016, 11/19/2017  . Influenza,inj,Quad PF,6+ Mos 01/04/2015, 11/03/2015  . PFIZER SARS-COV-2 Vaccination 04/23/2019, 05/31/2019  . Zoster Recombinat (Shingrix) 01/15/2017    Health Maintenance  Topic Date Due  . TETANUS/TDAP  Never done  . PNA vac Low Risk Adult (1 of 2 - PCV13) Never done  . DEXA SCAN  04/10/2016  . INFLUENZA VACCINE  08/30/2019  . COVID-19 Vaccine   Completed       Assessment  This is a routine wellness examination for Sheena Morrow.  Health Maintenance: Due or Overdue Health Maintenance Due  Topic Date Due  . TETANUS/TDAP  Never done  . PNA vac Low Risk Adult (1 of 2 - PCV13) Never done  . DEXA SCAN  04/10/2016    Sheena Morrow does not need a referral for Community Assistance: Care Management:   no Social Work:    no Prescription Assistance:  no Nutrition/Diabetes Education:  no   Plan:  Personalized Goals Goals Addressed            This Visit's Progress   . Patient Stated       07/01/2019 AWV Goal: Keep All Scheduled Appointments  Over the next year, patient will attend all scheduled appointments with their PCP and any specialists that they see.       Personalized Health Maintenance & Screening Recommendations  Pneumococcal vaccine , Tdap, Dexa scan  Lung Cancer Screening Recommended: no (Low Dose CT Chest recommended if Age 63-80 years, 30 pack-year currently smoking OR have quit w/in past 15 years) Hepatitis C Screening recommended: no HIV Screening recommended: no  Advanced Directives: Written information was not prepared per patient's request.  Referrals & Orders No orders of the defined types were placed in this encounter.   Follow-up Plan . Follow-up with Claretta Fraise, MD as planned   I have personally reviewed and noted the following in the patient's chart:   . Medical and social history . Use of alcohol, tobacco or illicit drugs  . Current medications and supplements . Functional ability and status . Nutritional status . Physical activity . Advanced directives . List of other physicians . Hospitalizations, surgeries, and ER visits in previous 12 months . Vitals . Screenings to include cognitive, depression, and falls . Referrals and appointments  In addition, I have reviewed and discussed with Sheena Morrow certain preventive protocols, quality metrics, and best practice  recommendations. A written personalized care plan for preventive services as well as general preventive health recommendations is available and can be mailed to the patient at her request.      Baldomero Lamy, LPN  02/04/7114

## 2019-07-10 DIAGNOSIS — D509 Iron deficiency anemia, unspecified: Secondary | ICD-10-CM | POA: Diagnosis not present

## 2019-07-10 LAB — CBC WITH DIFFERENTIAL/PLATELET
Basophils Absolute: 0.1 10*3/uL (ref 0.0–0.2)
Basos: 2 %
EOS (ABSOLUTE): 0.3 10*3/uL (ref 0.0–0.4)
Eos: 7 %
Hematocrit: 30.8 % — ABNORMAL LOW (ref 34.0–46.6)
Hemoglobin: 9.6 g/dL — ABNORMAL LOW (ref 11.1–15.9)
Immature Grans (Abs): 0 10*3/uL (ref 0.0–0.1)
Immature Granulocytes: 0 %
Lymphocytes Absolute: 1.4 10*3/uL (ref 0.7–3.1)
Lymphs: 30 %
MCH: 30.7 pg (ref 26.6–33.0)
MCHC: 31.2 g/dL — ABNORMAL LOW (ref 31.5–35.7)
MCV: 98 fL — ABNORMAL HIGH (ref 79–97)
Monocytes Absolute: 0.4 10*3/uL (ref 0.1–0.9)
Monocytes: 9 %
Neutrophils Absolute: 2.5 10*3/uL (ref 1.4–7.0)
Neutrophils: 52 %
Platelets: 261 10*3/uL (ref 150–450)
RBC: 3.13 x10E6/uL — ABNORMAL LOW (ref 3.77–5.28)
RDW: 18.6 % — ABNORMAL HIGH (ref 11.7–15.4)
WBC: 4.8 10*3/uL (ref 3.4–10.8)

## 2019-07-10 NOTE — Addendum Note (Signed)
Addended by: Earlene Plater on: 07/10/2019 12:50 PM   Modules accepted: Orders

## 2019-07-27 DIAGNOSIS — I130 Hypertensive heart and chronic kidney disease with heart failure and stage 1 through stage 4 chronic kidney disease, or unspecified chronic kidney disease: Secondary | ICD-10-CM | POA: Diagnosis not present

## 2019-07-27 DIAGNOSIS — I4891 Unspecified atrial fibrillation: Secondary | ICD-10-CM | POA: Diagnosis not present

## 2019-07-27 DIAGNOSIS — I5020 Unspecified systolic (congestive) heart failure: Secondary | ICD-10-CM | POA: Diagnosis not present

## 2019-07-27 DIAGNOSIS — N183 Chronic kidney disease, stage 3 unspecified: Secondary | ICD-10-CM | POA: Diagnosis not present

## 2019-07-27 DIAGNOSIS — D649 Anemia, unspecified: Secondary | ICD-10-CM | POA: Diagnosis not present

## 2019-07-27 DIAGNOSIS — I2510 Atherosclerotic heart disease of native coronary artery without angina pectoris: Secondary | ICD-10-CM | POA: Diagnosis not present

## 2019-07-27 DIAGNOSIS — Z7982 Long term (current) use of aspirin: Secondary | ICD-10-CM | POA: Diagnosis not present

## 2019-07-27 DIAGNOSIS — G8929 Other chronic pain: Secondary | ICD-10-CM | POA: Diagnosis not present

## 2019-07-27 DIAGNOSIS — Z79899 Other long term (current) drug therapy: Secondary | ICD-10-CM | POA: Diagnosis not present

## 2019-08-09 ENCOUNTER — Other Ambulatory Visit: Payer: Self-pay | Admitting: Family Medicine

## 2019-08-11 ENCOUNTER — Other Ambulatory Visit: Payer: Self-pay | Admitting: Family Medicine

## 2019-08-12 ENCOUNTER — Ambulatory Visit (INDEPENDENT_AMBULATORY_CARE_PROVIDER_SITE_OTHER): Payer: Medicare HMO | Admitting: Family Medicine

## 2019-08-12 ENCOUNTER — Encounter: Payer: Self-pay | Admitting: Family Medicine

## 2019-08-12 ENCOUNTER — Other Ambulatory Visit: Payer: Self-pay

## 2019-08-12 VITALS — BP 98/60 | HR 68 | Temp 98.1°F | Resp 20 | Ht 61.0 in | Wt 128.0 lb

## 2019-08-12 DIAGNOSIS — I2510 Atherosclerotic heart disease of native coronary artery without angina pectoris: Secondary | ICD-10-CM

## 2019-08-12 DIAGNOSIS — B0229 Other postherpetic nervous system involvement: Secondary | ICD-10-CM | POA: Diagnosis not present

## 2019-08-12 DIAGNOSIS — I5020 Unspecified systolic (congestive) heart failure: Secondary | ICD-10-CM | POA: Diagnosis not present

## 2019-08-12 DIAGNOSIS — F1120 Opioid dependence, uncomplicated: Secondary | ICD-10-CM

## 2019-08-12 DIAGNOSIS — N1832 Chronic kidney disease, stage 3b: Secondary | ICD-10-CM | POA: Diagnosis not present

## 2019-08-12 DIAGNOSIS — D500 Iron deficiency anemia secondary to blood loss (chronic): Secondary | ICD-10-CM

## 2019-08-12 DIAGNOSIS — I480 Paroxysmal atrial fibrillation: Secondary | ICD-10-CM | POA: Diagnosis not present

## 2019-08-12 DIAGNOSIS — Z23 Encounter for immunization: Secondary | ICD-10-CM | POA: Diagnosis not present

## 2019-08-12 MED ORDER — METHADONE HCL 10 MG PO TABS: 10.0000 mg | ORAL_TABLET | Freq: Three times a day (TID) | ORAL | 0 refills | Status: DC

## 2019-08-12 NOTE — Progress Notes (Signed)
Subjective:  Patient ID: Sheena Morrow, female    DOB: 1933/05/03  Age: 86 y.o. MRN: [id number]  CC: Medical Management of Chronic Issues and pain management   HPI [name] presents for follow-up of her chronic postherpetic neuralgia pain.  She continues to use methadone.  Her dose has been stable for over a decade.  She cannot function without it because the pain rebounds from 1-2/10 up to 9-10/10 if he misses a single dose.  PDMP shows that she has no discrepancies in use of her medicine.  She is at 90 morphine milligram equivalents per day.  Atrial fibrillation follow up. Pt. is treated with rate control and anticoagulation. Pt.  denies palpitations, rapid rate, chest pain, dyspnea and edema. There has been no bleeding from nose or gums. Pt. has not noticed blood with urine or stool.  Although there is routine bruising easily, it is not excessive.   Depression screen [facility] 2/9 08/12/2019 07/01/2019 06/24/2019  Decreased Interest 0 0 0  Down, Depressed, Hopeless 0 0 0  PHQ - 2 Score 0 0 0    History [name] has a past medical history of Clotting disorder ([place]), Constipation due to opioid therapy, Old MI (myocardial infarction) (07/21/2015), Osteoporosis, Postherpetic neuralgia, and Systolic CHF with reduced left ventricular function, NYHA class 2 ([place]) (05/21/2015).   She has a past surgical history that includes Hip surgery.   Her family history is not on file.She reports that she quit smoking about 11 years ago. Her smoking use included cigarettes. She started smoking about 50 years ago. She smoked 0.50 packs per day. She has never used smokeless tobacco. She reports that she does not drink alcohol and does not use drugs.    ROS Review of Systems  Constitutional: Negative.   HENT: Negative.   Eyes: Negative for visual disturbance.  Respiratory: Negative for shortness of breath.   Cardiovascular: Negative for chest pain.  Gastrointestinal: Negative for abdominal pain.   Musculoskeletal: Positive for back pain (Midline mid back pain burning in nature). Negative for arthralgias.    Objective:  BP 98/60    Pulse 68    Temp 98.1 F (36.7 C)    Resp 20    Ht '5\' 1"'  (1.549 m)    Wt 128 lb (58.1 kg)    SpO2 96%    BMI 24.19 kg/m   BP Readings from Last 3 Encounters:  08/12/19 98/60  06/24/19 (!) 119/58  05/13/19 106/62    Wt Readings from Last 3 Encounters:  08/12/19 128 lb (58.1 kg)  06/24/19 134 lb 4 oz (60.9 kg)  05/13/19 152 lb 4 oz (69.1 kg)     Physical Exam Constitutional:      General: She is not in acute distress.    Appearance: She is well-developed.  HENT:     Head: Normocephalic and atraumatic.  Eyes:     Conjunctiva/sclera: Conjunctivae normal.     Pupils: Pupils are equal, round, and reactive to light.  Neck:     Thyroid: No thyromegaly.  Cardiovascular:     Rate and Rhythm: Normal rate and regular rhythm.     Heart sounds: Normal heart sounds. No murmur heard.   Pulmonary:     Effort: Pulmonary effort is normal. No respiratory distress.     Breath sounds: Normal breath sounds. No wheezing or rales.  Abdominal:     Palpations: Abdomen is soft.     Tenderness: There is no abdominal tenderness.  Musculoskeletal:  General: Normal range of motion.     Cervical back: Normal range of motion and neck supple.  Lymphadenopathy:     Cervical: No cervical adenopathy.  Skin:    General: Skin is warm and dry.  Neurological:     Mental Status: She is alert and oriented to person, place, and time.  Psychiatric:        Behavior: Behavior normal.        Thought Content: Thought content normal.        Judgment: Judgment normal.       Assessment & Plan:   Balinda was seen today for medical management of chronic issues and pain management.  Diagnoses and all orders for this visit:  Iron deficiency anemia due to chronic blood loss -     CBC with Differential/Platelet -     ENI77+OEUM  Uncomplicated opioid dependence  (HCC) -     CBC with Differential/Platelet -     CMP14+EGFR  Stage 3b chronic kidney disease -     CBC with Differential/Platelet -     CMP14+EGFR  CAD in native artery -     CBC with Differential/Platelet -     CMP14+EGFR  Paroxysmal atrial fibrillation (HCC) -     CBC with Differential/Platelet -     PNT61+WERX  Systolic CHF with reduced left ventricular function, NYHA class 2 (HCC) -     CBC with Differential/Platelet -     CMP14+EGFR  Postherpetic neuralgia -     CBC with Differential/Platelet -     CMP14+EGFR -     methadone (DOLOPHINE) 10 MG tablet; Take 1 tablet (10 mg total) by mouth 3 (three) times daily. -     methadone (DOLOPHINE) 10 MG tablet; Take 1 tablet (10 mg total) by mouth 3 (three) times daily.  Other orders -     methadone (DOLOPHINE) 10 MG tablet; Take 1 tablet (10 mg total) by mouth 3 (three) times daily. -     Pneumococcal conjugate vaccine 13-valent       I have discontinued [name]'s lisinopril. I am also having her maintain her nitroGLYCERIN, polyethylene glycol powder, Ferrous Fumarate, esomeprazole, montelukast, Restasis, Linzess, atorvastatin, furosemide, RABEprazole, aspirin, multivitamin, Biotin, gabapentin, diltiazem, methadone, methadone, and methadone.  Allergies as of 08/12/2019      Reactions   Nsaids Other (See Comments)   GI Bleed   Dexilant [dexlansoprazole] Diarrhea   Pantoprazole       Medication List       Accurate as of August 12, 2019 11:59 PM. If you have any questions, ask your nurse or doctor.        STOP taking these medications   lisinopril 5 MG tablet Commonly known as: ZESTRIL Stopped by: Claretta Fraise, MD     TAKE these medications   aspirin 81 MG EC tablet Take 1 tablet (81 mg total) by mouth daily.   atorvastatin 20 MG tablet Commonly known as: LIPITOR TAKE 1 TABLET BY MOUTH EVERY DAY   Biotin 10000 MCG Tabs Take by mouth.   diltiazem 120 MG 24 hr capsule Commonly known as: CARDIZEM  CD TAKE 1 CAPSULE (120 MG TOTAL) BY MOUTH DAILY. FOR BLOOD PRESSURE   esomeprazole 40 MG capsule Commonly known as: NEXIUM TAKE 1 CAPSULE BY MOUTH EVERY DAY   Ferrous Fumarate 324 (106 Fe) MG Tabs tablet Commonly known as: HEMOCYTE - 106 mg FE Take 1 tablet (106 mg of iron total) by mouth 2 (two) times daily. For iron deficiency  anemia   furosemide 40 MG tablet Commonly known as: LASIX TAKE 1 TABLET BY MOUTH 2 TIMES DAILY FOR SWELLING   gabapentin 800 MG tablet Commonly known as: NEURONTIN TAKE 1 TABLET EACH MORNING,TAKE 1 TABLET EACH AFTERNOON & 2 TABLETS EACH EVENING   Linzess 290 MCG Caps capsule Generic drug: linaclotide TAKE 1 CAPSULE (290 MCG TOTAL) BY MOUTH DAILY. TO REGULATE BOWEL MOVEMENTS   methadone 10 MG tablet Commonly known as: Dolophine Take 1 tablet (10 mg total) by mouth 3 (three) times daily. What changed: Another medication with the same name was changed. Make sure you understand how and when to take each. Changed by: Claretta Fraise, MD   methadone 10 MG tablet Commonly known as: Dolophine Take 1 tablet (10 mg total) by mouth 3 (three) times daily. Start taking on: September 12, 2019 What changed: These instructions start on September 12, 2019. If you are unsure what to do until then, ask your doctor or other care provider. Changed by: Claretta Fraise, MD   methadone 10 MG tablet Commonly known as: Dolophine Take 1 tablet (10 mg total) by mouth 3 (three) times daily. Start taking on: October 11, 2019 What changed: These instructions start on October 11, 2019. If you are unsure what to do until then, ask your doctor or other care provider. Changed by: Claretta Fraise, MD   montelukast 10 MG tablet Commonly known as: SINGULAIR TAKE 1 TABLET BY MOUTH EVERYDAY AT BEDTIME   multivitamin tablet Take 1 tablet by mouth daily.   nitroGLYCERIN 0.4 MG SL tablet Commonly known as: NITROSTAT Place under the tongue.   polyethylene glycol powder 17 GM/SCOOP  powder Commonly known as: GLYCOLAX/MIRALAX USE 1 DOSING CUP ORALLY IN 8 OZ. OF WATER DAILY   RABEprazole 20 MG tablet Commonly known as: ACIPHEX Take 1 tablet (20 mg total) by mouth daily.   Restasis 0.05 % ophthalmic emulsion Generic drug: cycloSPORINE        Follow-up: No follow-ups on file.  Claretta Fraise, M.D.

## 2019-08-13 LAB — CBC WITH DIFFERENTIAL/PLATELET
Basophils Absolute: 0.1 10*3/uL (ref 0.0–0.2)
Basos: 1 %
EOS (ABSOLUTE): 0.2 10*3/uL (ref 0.0–0.4)
Eos: 3 %
Hematocrit: 34.6 % (ref 34.0–46.6)
Hemoglobin: 11.2 g/dL (ref 11.1–15.9)
Immature Grans (Abs): 0 10*3/uL (ref 0.0–0.1)
Immature Granulocytes: 0 %
Lymphocytes Absolute: 1.9 10*3/uL (ref 0.7–3.1)
Lymphs: 39 %
MCH: 31.8 pg (ref 26.6–33.0)
MCHC: 32.4 g/dL (ref 31.5–35.7)
MCV: 98 fL — ABNORMAL HIGH (ref 79–97)
Monocytes Absolute: 0.4 10*3/uL (ref 0.1–0.9)
Monocytes: 9 %
Neutrophils Absolute: 2.3 10*3/uL (ref 1.4–7.0)
Neutrophils: 48 %
Platelets: 216 10*3/uL (ref 150–450)
RBC: 3.52 x10E6/uL — ABNORMAL LOW (ref 3.77–5.28)
RDW: 17.2 % — ABNORMAL HIGH (ref 11.7–15.4)
WBC: 4.9 10*3/uL (ref 3.4–10.8)

## 2019-08-13 LAB — CMP14+EGFR
ALT: 17 IU/L (ref 0–32)
AST: 36 IU/L (ref 0–40)
Albumin/Globulin Ratio: 1.1 — ABNORMAL LOW (ref 1.2–2.2)
Albumin: 3.7 g/dL (ref 3.6–4.6)
Alkaline Phosphatase: 98 IU/L (ref 48–121)
BUN/Creatinine Ratio: 18 (ref 12–28)
BUN: 25 mg/dL (ref 8–27)
Bilirubin Total: 0.3 mg/dL (ref 0.0–1.2)
CO2: 31 mmol/L — ABNORMAL HIGH (ref 20–29)
Calcium: 9.2 mg/dL (ref 8.7–10.3)
Chloride: 99 mmol/L (ref 96–106)
Creatinine, Ser: 1.41 mg/dL — ABNORMAL HIGH (ref 0.57–1.00)
GFR calc Af Amer: 39 mL/min/{1.73_m2} — ABNORMAL LOW (ref >59)
GFR calc non Af Amer: 34 mL/min/{1.73_m2} — ABNORMAL LOW (ref >59)
Globulin, Total: 3.3 g/dL (ref 1.5–4.5)
Glucose: 103 mg/dL — ABNORMAL HIGH (ref 65–99)
Potassium: 3.4 mmol/L — ABNORMAL LOW (ref 3.5–5.2)
Sodium: 142 mmol/L (ref 134–144)
Total Protein: 7 g/dL (ref 6.0–8.5)

## 2019-08-13 NOTE — Progress Notes (Signed)
Hello Sheena Morrow,  Your lab result is normal and/or stable.Some minor variations that are not significant are commonly marked abnormal, but do not represent any medical problem for you.  Best regards, Claretta Fraise, M.D.

## 2019-08-16 ENCOUNTER — Encounter: Payer: Self-pay | Admitting: Family Medicine

## 2019-08-21 ENCOUNTER — Other Ambulatory Visit: Payer: Self-pay | Admitting: Family Medicine

## 2019-08-27 ENCOUNTER — Other Ambulatory Visit: Payer: Self-pay | Admitting: Family Medicine

## 2019-08-31 ENCOUNTER — Other Ambulatory Visit: Payer: Self-pay | Admitting: *Deleted

## 2019-08-31 MED ORDER — FERROUS FUMARATE 324 (106 FE) MG PO TABS: 106.0000 mg | ORAL_TABLET | Freq: Two times a day (BID) | ORAL | 5 refills | Status: DC

## 2019-09-30 ENCOUNTER — Other Ambulatory Visit: Payer: Self-pay | Admitting: Family Medicine

## 2019-10-13 ENCOUNTER — Other Ambulatory Visit: Payer: Self-pay | Admitting: Family Medicine

## 2019-10-15 ENCOUNTER — Other Ambulatory Visit: Payer: Self-pay | Admitting: Family Medicine

## 2019-10-23 ENCOUNTER — Other Ambulatory Visit: Payer: Self-pay | Admitting: Family Medicine

## 2019-11-05 ENCOUNTER — Encounter: Payer: Self-pay | Admitting: Family Medicine

## 2019-11-05 ENCOUNTER — Other Ambulatory Visit: Payer: Self-pay

## 2019-11-05 ENCOUNTER — Ambulatory Visit (INDEPENDENT_AMBULATORY_CARE_PROVIDER_SITE_OTHER): Payer: Medicare HMO | Admitting: Family Medicine

## 2019-11-05 VITALS — BP 130/67 | HR 79 | Temp 97.3°F | Resp 20 | Ht 61.0 in | Wt 131.0 lb

## 2019-11-05 DIAGNOSIS — B0229 Other postherpetic nervous system involvement: Secondary | ICD-10-CM

## 2019-11-05 DIAGNOSIS — N1832 Chronic kidney disease, stage 3b: Secondary | ICD-10-CM

## 2019-11-05 DIAGNOSIS — Z23 Encounter for immunization: Secondary | ICD-10-CM | POA: Diagnosis not present

## 2019-11-05 DIAGNOSIS — D500 Iron deficiency anemia secondary to blood loss (chronic): Secondary | ICD-10-CM | POA: Diagnosis not present

## 2019-11-05 DIAGNOSIS — F1120 Opioid dependence, uncomplicated: Secondary | ICD-10-CM | POA: Diagnosis not present

## 2019-11-05 MED ORDER — METHADONE HCL 10 MG PO TABS: 10.0000 mg | ORAL_TABLET | Freq: Three times a day (TID) | ORAL | 0 refills | Status: DC

## 2019-11-05 MED ORDER — METHADONE HCL 10 MG PO TABS
10.0000 mg | ORAL_TABLET | Freq: Three times a day (TID) | ORAL | 0 refills | Status: DC
Start: 2019-12-14 — End: 2020-02-23

## 2019-11-05 NOTE — Progress Notes (Signed)
Subjective:  Patient ID: Sheena Morrow, female    DOB: 06-Apr-1933  Age: 86 y.o. MRN: [id number]  CC: Medical Management of Chronic Issues   HPI [name] presents for follow-up on her chronic pain.  She has been taking methadone for over a decade without changing the dose at all.  This is due to postherpetic neuralgia.  Her pain is 10/10 when she misses the medication.  It is 0-2/10 with the medication.  The pain is primarily in the mid back between the shoulder blades when it occurs.  She denies any problems with the medication other than chronic constipation which is adequately relieved by her Linzess.  The pain is mitigated somewhat by regular use of gabapentin as well.  PDMP review shows no prescriptions being filled that are inappropriate  Patient in for follow-up of elevated cholesterol. Doing well without complaints on current medication. Denies side effects of statin including myalgia and arthralgia and nausea. Also in today for liver function testing. Currently no chest pain, shortness of breath or other cardiovascular related symptoms noted.   Patient in for follow-up of atrial fibrillation. Patient denies any recent bouts of chest pain or palpitations. Additionally, patient is taking aspirin for anticoagulation.  Rate control with diltiazem. Depression screen [facility] 2/9 11/05/2019 08/12/2019 07/01/2019  Decreased Interest 0 0 0  Down, Depressed, Hopeless 0 0 0  PHQ - 2 Score 0 0 0    History Suetta has a past medical history of Clotting disorder ([place]), Constipation due to opioid therapy, Old MI (myocardial infarction) (07/21/2015), Osteoporosis, Postherpetic neuralgia, and Systolic CHF with reduced left ventricular function, NYHA class 2 ([place]) (05/21/2015).   She has a past surgical history that includes Hip surgery.   Her family history is not on file.She reports that she quit smoking about 11 years ago. Her smoking use included cigarettes. She started smoking about 50 years ago. She  smoked 0.50 packs per day. She has never used smokeless tobacco. She reports that she does not drink alcohol and does not use drugs.    ROS Review of Systems  Constitutional: Negative.   HENT: Negative.   Eyes: Negative for visual disturbance.  Respiratory: Negative for shortness of breath.   Cardiovascular: Negative for chest pain.  Gastrointestinal: Negative for abdominal pain.  Musculoskeletal: Negative for arthralgias.    Objective:  BP 130/67   Pulse 79   Temp (!) 97.3 F (36.3 C) (Temporal)   Resp 20   Ht 5' 1" (1.549 m)   Wt 131 lb (59.4 kg)   SpO2 97%   BMI 24.75 kg/m   BP Readings from Last 3 Encounters:  11/05/19 130/67  08/12/19 98/60  06/24/19 (!) 119/58    Wt Readings from Last 3 Encounters:  11/05/19 131 lb (59.4 kg)  08/12/19 128 lb (58.1 kg)  06/24/19 134 lb 4 oz (60.9 kg)     Physical Exam Constitutional:      General: She is not in acute distress.    Appearance: She is well-developed.  HENT:     Head: Normocephalic and atraumatic.  Eyes:     Conjunctiva/sclera: Conjunctivae normal.     Pupils: Pupils are equal, round, and reactive to light.  Neck:     Thyroid: No thyromegaly.  Cardiovascular:     Rate and Rhythm: Normal rate and regular rhythm.     Heart sounds: Normal heart sounds. No murmur heard.   Pulmonary:     Effort: Pulmonary effort is normal. No respiratory distress.  Breath sounds: Normal breath sounds. No wheezing or rales.  Abdominal:     General: Bowel sounds are normal. There is no distension.     Palpations: Abdomen is soft.     Tenderness: There is no abdominal tenderness.  Musculoskeletal:        General: Normal range of motion.     Cervical back: Normal range of motion and neck supple.  Lymphadenopathy:     Cervical: No cervical adenopathy.  Skin:    General: Skin is warm and dry.  Neurological:     Mental Status: She is alert and oriented to person, place, and time.  Psychiatric:        Behavior: Behavior  normal.        Thought Content: Thought content normal.        Judgment: Judgment normal.       Assessment & Plan:   Evolett was seen today for medical management of chronic issues.  Diagnoses and all orders for this visit:  Postherpetic neuralgia -     CBC with Differential/Platelet -     CMP14+EGFR -     methadone (DOLOPHINE) 10 MG tablet; Take 1 tablet (10 mg total) by mouth 3 (three) times daily. -     methadone (DOLOPHINE) 10 MG tablet; Take 1 tablet (10 mg total) by mouth 3 (three) times daily.  Need for immunization against influenza -     Flu Vaccine QUAD High Dose(Fluad)  Iron deficiency anemia due to chronic blood loss -     CBC with Differential/Platelet  Uncomplicated opioid dependence (HCC)  Stage 3b chronic kidney disease (HCC) -     CMP14+EGFR  Other orders -     methadone (DOLOPHINE) 10 MG tablet; Take 1 tablet (10 mg total) by mouth 3 (three) times daily.       I am having Sheena Morrow maintain her nitroGLYCERIN, polyethylene glycol powder, esomeprazole, Restasis, furosemide, RABEprazole, aspirin, multivitamin, Biotin, gabapentin, Ferrous Fumarate, atorvastatin, montelukast, Linzess, diltiazem, methadone, methadone, and methadone.  Allergies as of 11/05/2019      Reactions   Nsaids Other (See Comments)   GI Bleed   Dexilant [dexlansoprazole] Diarrhea   Pantoprazole       Medication List       Accurate as of November 05, 2019 11:59 PM. If you have any questions, ask your nurse or doctor.        aspirin 81 MG EC tablet Take 1 tablet (81 mg total) by mouth daily.   atorvastatin 20 MG tablet Commonly known as: LIPITOR TAKE 1 TABLET BY MOUTH EVERY DAY   Biotin 10000 MCG Tabs Take by mouth.   diltiazem 120 MG 24 hr capsule Commonly known as: CARDIZEM CD TAKE 1 CAPSULE (120 MG TOTAL) BY MOUTH DAILY. FOR BLOOD PRESSURE   esomeprazole 40 MG capsule Commonly known as: NEXIUM TAKE 1 CAPSULE BY MOUTH EVERY DAY   Ferrous Fumarate 324 (106 Fe)  MG Tabs tablet Commonly known as: HEMOCYTE - 106 mg FE Take 1 tablet (106 mg of iron total) by mouth 2 (two) times daily. For iron deficiency anemia   furosemide 40 MG tablet Commonly known as: LASIX TAKE 1 TABLET BY MOUTH 2 TIMES DAILY FOR SWELLING   gabapentin 800 MG tablet Commonly known as: NEURONTIN TAKE 1 TABLET EACH MORNING,TAKE 1 TABLET EACH AFTERNOON & 2 TABLETS EACH EVENING   Linzess 290 MCG Caps capsule Generic drug: linaclotide TAKE 1 CAPSULE (290 MCG TOTAL) BY MOUTH DAILY. TO REGULATE BOWEL MOVEMENTS  methadone 10 MG tablet Commonly known as: Dolophine Take 1 tablet (10 mg total) by mouth 3 (three) times daily. What changed: Another medication with the same name was changed. Make sure you understand how and when to take each. Changed by: Claretta Fraise, MD   methadone 10 MG tablet Commonly known as: Dolophine Take 1 tablet (10 mg total) by mouth 3 (three) times daily. Start taking on: December 14, 2019 What changed: These instructions start on December 14, 2019. If you are unsure what to do until then, ask your doctor or other care provider. Changed by: Claretta Fraise, MD   methadone 10 MG tablet Commonly known as: Dolophine Take 1 tablet (10 mg total) by mouth 3 (three) times daily. Start taking on: January 13, 2020 What changed: These instructions start on January 13, 2020. If you are unsure what to do until then, ask your doctor or other care provider. Changed by: Claretta Fraise, MD   montelukast 10 MG tablet Commonly known as: SINGULAIR TAKE 1 TABLET BY MOUTH EVERYDAY AT BEDTIME   multivitamin tablet Take 1 tablet by mouth daily.   nitroGLYCERIN 0.4 MG SL tablet Commonly known as: NITROSTAT Place under the tongue.   polyethylene glycol powder 17 GM/SCOOP powder Commonly known as: GLYCOLAX/MIRALAX USE 1 DOSING CUP ORALLY IN 8 OZ. OF WATER DAILY   RABEprazole 20 MG tablet Commonly known as: ACIPHEX Take 1 tablet (20 mg total) by mouth daily.    Restasis 0.05 % ophthalmic emulsion Generic drug: cycloSPORINE        Follow-up: Return in about 3 months (around 02/05/2020).  Claretta Fraise, M.D.

## 2019-11-06 LAB — CBC WITH DIFFERENTIAL/PLATELET
Basophils Absolute: 0.1 10*3/uL (ref 0.0–0.2)
Basos: 1 %
EOS (ABSOLUTE): 0.3 10*3/uL (ref 0.0–0.4)
Eos: 5 %
Hematocrit: 38.4 % (ref 34.0–46.6)
Hemoglobin: 12.6 g/dL (ref 11.1–15.9)
Immature Grans (Abs): 0 10*3/uL (ref 0.0–0.1)
Immature Granulocytes: 0 %
Lymphocytes Absolute: 1.8 10*3/uL (ref 0.7–3.1)
Lymphs: 32 %
MCH: 33.2 pg — ABNORMAL HIGH (ref 26.6–33.0)
MCHC: 32.8 g/dL (ref 31.5–35.7)
MCV: 101 fL — ABNORMAL HIGH (ref 79–97)
Monocytes Absolute: 0.4 10*3/uL (ref 0.1–0.9)
Monocytes: 8 %
Neutrophils Absolute: 3 10*3/uL (ref 1.4–7.0)
Neutrophils: 54 %
Platelets: 219 10*3/uL (ref 150–450)
RBC: 3.79 x10E6/uL (ref 3.77–5.28)
RDW: 13.3 % (ref 11.7–15.4)
WBC: 5.6 10*3/uL (ref 3.4–10.8)

## 2019-11-06 LAB — CMP14+EGFR
ALT: 27 IU/L (ref 0–32)
AST: 48 IU/L — ABNORMAL HIGH (ref 0–40)
Albumin/Globulin Ratio: 1.3 (ref 1.2–2.2)
Albumin: 4.1 g/dL (ref 3.6–4.6)
Alkaline Phosphatase: 104 IU/L (ref 44–121)
BUN/Creatinine Ratio: 17 (ref 12–28)
BUN: 21 mg/dL (ref 8–27)
Bilirubin Total: 0.4 mg/dL (ref 0.0–1.2)
CO2: 31 mmol/L — ABNORMAL HIGH (ref 20–29)
Calcium: 9.2 mg/dL (ref 8.7–10.3)
Chloride: 100 mmol/L (ref 96–106)
Creatinine, Ser: 1.22 mg/dL — ABNORMAL HIGH (ref 0.57–1.00)
GFR calc Af Amer: 47 mL/min/{1.73_m2} — ABNORMAL LOW (ref >59)
GFR calc non Af Amer: 41 mL/min/{1.73_m2} — ABNORMAL LOW (ref >59)
Globulin, Total: 3.2 g/dL (ref 1.5–4.5)
Glucose: 85 mg/dL (ref 65–99)
Potassium: 4 mmol/L (ref 3.5–5.2)
Sodium: 143 mmol/L (ref 134–144)
Total Protein: 7.3 g/dL (ref 6.0–8.5)

## 2019-11-07 NOTE — Progress Notes (Signed)
Hello [name],  Your lab result is normal and/or stable.Some minor variations that are not significant are commonly marked abnormal, but do not represent any medical problem for you.  Best regards, Claretta Fraise, M.D.

## 2019-11-10 ENCOUNTER — Ambulatory Visit: Payer: Medicare HMO | Admitting: Family Medicine

## 2019-11-15 ENCOUNTER — Encounter: Payer: Self-pay | Admitting: Family Medicine

## 2019-11-16 ENCOUNTER — Other Ambulatory Visit: Payer: Self-pay | Admitting: Family Medicine

## 2019-11-18 ENCOUNTER — Other Ambulatory Visit: Payer: Self-pay | Admitting: Family Medicine

## 2019-11-24 ENCOUNTER — Telehealth: Payer: Self-pay | Admitting: Family Medicine

## 2019-11-24 NOTE — Telephone Encounter (Signed)
Pt is wanting to know if she still needs to be taking 81 mg of aspirin

## 2019-11-26 NOTE — Telephone Encounter (Signed)
She doesn't have to since she is taking another blood thinner

## 2019-11-26 NOTE — Telephone Encounter (Signed)
PATIENT AWARE

## 2019-12-19 ENCOUNTER — Other Ambulatory Visit: Payer: Self-pay | Admitting: Family Medicine

## 2019-12-29 DIAGNOSIS — I272 Pulmonary hypertension, unspecified: Secondary | ICD-10-CM | POA: Diagnosis not present

## 2019-12-29 DIAGNOSIS — S82122A Displaced fracture of lateral condyle of left tibia, initial encounter for closed fracture: Secondary | ICD-10-CM | POA: Diagnosis not present

## 2019-12-29 DIAGNOSIS — S80919A Unspecified superficial injury of unspecified knee, initial encounter: Secondary | ICD-10-CM | POA: Diagnosis not present

## 2019-12-29 DIAGNOSIS — Z961 Presence of intraocular lens: Secondary | ICD-10-CM | POA: Diagnosis not present

## 2019-12-29 DIAGNOSIS — S82832A Other fracture of upper and lower end of left fibula, initial encounter for closed fracture: Secondary | ICD-10-CM | POA: Diagnosis not present

## 2019-12-29 DIAGNOSIS — M25562 Pain in left knee: Secondary | ICD-10-CM | POA: Diagnosis not present

## 2019-12-29 DIAGNOSIS — R52 Pain, unspecified: Secondary | ICD-10-CM | POA: Diagnosis not present

## 2019-12-29 DIAGNOSIS — G894 Chronic pain syndrome: Secondary | ICD-10-CM | POA: Diagnosis not present

## 2019-12-29 DIAGNOSIS — H04123 Dry eye syndrome of bilateral lacrimal glands: Secondary | ICD-10-CM | POA: Diagnosis not present

## 2019-12-29 DIAGNOSIS — S82142A Displaced bicondylar fracture of left tibia, initial encounter for closed fracture: Secondary | ICD-10-CM | POA: Insufficient documentation

## 2019-12-29 DIAGNOSIS — Z23 Encounter for immunization: Secondary | ICD-10-CM | POA: Diagnosis not present

## 2019-12-29 DIAGNOSIS — W01198A Fall on same level from slipping, tripping and stumbling with subsequent striking against other object, initial encounter: Secondary | ICD-10-CM | POA: Diagnosis not present

## 2019-12-29 DIAGNOSIS — I5020 Unspecified systolic (congestive) heart failure: Secondary | ICD-10-CM | POA: Diagnosis not present

## 2019-12-29 DIAGNOSIS — I959 Hypotension, unspecified: Secondary | ICD-10-CM | POA: Diagnosis not present

## 2019-12-29 DIAGNOSIS — D539 Nutritional anemia, unspecified: Secondary | ICD-10-CM | POA: Diagnosis not present

## 2019-12-29 DIAGNOSIS — N1831 Chronic kidney disease, stage 3a: Secondary | ICD-10-CM | POA: Diagnosis not present

## 2019-12-29 DIAGNOSIS — K219 Gastro-esophageal reflux disease without esophagitis: Secondary | ICD-10-CM | POA: Diagnosis not present

## 2019-12-29 DIAGNOSIS — D631 Anemia in chronic kidney disease: Secondary | ICD-10-CM | POA: Diagnosis not present

## 2019-12-29 DIAGNOSIS — R339 Retention of urine, unspecified: Secondary | ICD-10-CM | POA: Diagnosis not present

## 2019-12-29 DIAGNOSIS — I2510 Atherosclerotic heart disease of native coronary artery without angina pectoris: Secondary | ICD-10-CM | POA: Diagnosis not present

## 2019-12-29 DIAGNOSIS — J45909 Unspecified asthma, uncomplicated: Secondary | ICD-10-CM | POA: Diagnosis not present

## 2019-12-29 DIAGNOSIS — I252 Old myocardial infarction: Secondary | ICD-10-CM | POA: Diagnosis not present

## 2019-12-29 DIAGNOSIS — N183 Chronic kidney disease, stage 3 unspecified: Secondary | ICD-10-CM | POA: Diagnosis not present

## 2019-12-29 DIAGNOSIS — M25552 Pain in left hip: Secondary | ICD-10-CM | POA: Diagnosis not present

## 2019-12-29 DIAGNOSIS — W19XXXA Unspecified fall, initial encounter: Secondary | ICD-10-CM | POA: Diagnosis not present

## 2019-12-29 DIAGNOSIS — I482 Chronic atrial fibrillation, unspecified: Secondary | ICD-10-CM | POA: Diagnosis not present

## 2019-12-29 DIAGNOSIS — B0229 Other postherpetic nervous system involvement: Secondary | ICD-10-CM | POA: Diagnosis not present

## 2019-12-29 DIAGNOSIS — I4891 Unspecified atrial fibrillation: Secondary | ICD-10-CM | POA: Diagnosis not present

## 2019-12-29 DIAGNOSIS — S82115A Nondisplaced fracture of left tibial spine, initial encounter for closed fracture: Secondary | ICD-10-CM | POA: Diagnosis not present

## 2019-12-30 DIAGNOSIS — W01198A Fall on same level from slipping, tripping and stumbling with subsequent striking against other object, initial encounter: Secondary | ICD-10-CM | POA: Diagnosis not present

## 2019-12-30 DIAGNOSIS — S82142A Displaced bicondylar fracture of left tibia, initial encounter for closed fracture: Secondary | ICD-10-CM | POA: Diagnosis not present

## 2019-12-30 DIAGNOSIS — I5020 Unspecified systolic (congestive) heart failure: Secondary | ICD-10-CM | POA: Diagnosis not present

## 2019-12-30 DIAGNOSIS — D539 Nutritional anemia, unspecified: Secondary | ICD-10-CM | POA: Diagnosis not present

## 2019-12-30 DIAGNOSIS — I482 Chronic atrial fibrillation, unspecified: Secondary | ICD-10-CM | POA: Diagnosis not present

## 2019-12-30 DIAGNOSIS — B0229 Other postherpetic nervous system involvement: Secondary | ICD-10-CM | POA: Diagnosis not present

## 2019-12-30 DIAGNOSIS — N1831 Chronic kidney disease, stage 3a: Secondary | ICD-10-CM | POA: Diagnosis not present

## 2019-12-31 DIAGNOSIS — I482 Chronic atrial fibrillation, unspecified: Secondary | ICD-10-CM | POA: Diagnosis not present

## 2019-12-31 DIAGNOSIS — N1831 Chronic kidney disease, stage 3a: Secondary | ICD-10-CM | POA: Diagnosis not present

## 2019-12-31 DIAGNOSIS — G894 Chronic pain syndrome: Secondary | ICD-10-CM | POA: Insufficient documentation

## 2019-12-31 DIAGNOSIS — I252 Old myocardial infarction: Secondary | ICD-10-CM | POA: Diagnosis not present

## 2019-12-31 DIAGNOSIS — S82142A Displaced bicondylar fracture of left tibia, initial encounter for closed fracture: Secondary | ICD-10-CM | POA: Diagnosis not present

## 2020-01-01 DIAGNOSIS — I482 Chronic atrial fibrillation, unspecified: Secondary | ICD-10-CM | POA: Diagnosis not present

## 2020-01-01 DIAGNOSIS — N1831 Chronic kidney disease, stage 3a: Secondary | ICD-10-CM | POA: Diagnosis not present

## 2020-01-01 DIAGNOSIS — I252 Old myocardial infarction: Secondary | ICD-10-CM | POA: Diagnosis not present

## 2020-01-01 DIAGNOSIS — G894 Chronic pain syndrome: Secondary | ICD-10-CM | POA: Diagnosis not present

## 2020-01-01 DIAGNOSIS — S82142A Displaced bicondylar fracture of left tibia, initial encounter for closed fracture: Secondary | ICD-10-CM | POA: Diagnosis not present

## 2020-01-02 DIAGNOSIS — S82142A Displaced bicondylar fracture of left tibia, initial encounter for closed fracture: Secondary | ICD-10-CM | POA: Diagnosis not present

## 2020-01-02 DIAGNOSIS — I482 Chronic atrial fibrillation, unspecified: Secondary | ICD-10-CM | POA: Diagnosis not present

## 2020-01-02 DIAGNOSIS — R339 Retention of urine, unspecified: Secondary | ICD-10-CM | POA: Insufficient documentation

## 2020-01-02 DIAGNOSIS — N1831 Chronic kidney disease, stage 3a: Secondary | ICD-10-CM | POA: Diagnosis not present

## 2020-01-02 DIAGNOSIS — G894 Chronic pain syndrome: Secondary | ICD-10-CM | POA: Diagnosis not present

## 2020-01-02 DIAGNOSIS — I252 Old myocardial infarction: Secondary | ICD-10-CM | POA: Diagnosis not present

## 2020-01-02 DIAGNOSIS — R338 Other retention of urine: Secondary | ICD-10-CM | POA: Insufficient documentation

## 2020-01-03 DIAGNOSIS — I252 Old myocardial infarction: Secondary | ICD-10-CM | POA: Diagnosis not present

## 2020-01-03 DIAGNOSIS — R339 Retention of urine, unspecified: Secondary | ICD-10-CM | POA: Diagnosis not present

## 2020-01-03 DIAGNOSIS — S82142A Displaced bicondylar fracture of left tibia, initial encounter for closed fracture: Secondary | ICD-10-CM | POA: Diagnosis not present

## 2020-01-03 DIAGNOSIS — I482 Chronic atrial fibrillation, unspecified: Secondary | ICD-10-CM | POA: Diagnosis not present

## 2020-01-03 DIAGNOSIS — G894 Chronic pain syndrome: Secondary | ICD-10-CM | POA: Diagnosis not present

## 2020-01-03 DIAGNOSIS — N1831 Chronic kidney disease, stage 3a: Secondary | ICD-10-CM | POA: Diagnosis not present

## 2020-01-04 DIAGNOSIS — G894 Chronic pain syndrome: Secondary | ICD-10-CM | POA: Diagnosis not present

## 2020-01-04 DIAGNOSIS — R339 Retention of urine, unspecified: Secondary | ICD-10-CM | POA: Diagnosis not present

## 2020-01-04 DIAGNOSIS — N1831 Chronic kidney disease, stage 3a: Secondary | ICD-10-CM | POA: Diagnosis not present

## 2020-01-04 DIAGNOSIS — S82142A Displaced bicondylar fracture of left tibia, initial encounter for closed fracture: Secondary | ICD-10-CM | POA: Diagnosis not present

## 2020-01-04 DIAGNOSIS — W01198A Fall on same level from slipping, tripping and stumbling with subsequent striking against other object, initial encounter: Secondary | ICD-10-CM | POA: Diagnosis not present

## 2020-01-04 DIAGNOSIS — I252 Old myocardial infarction: Secondary | ICD-10-CM | POA: Diagnosis not present

## 2020-01-04 DIAGNOSIS — I482 Chronic atrial fibrillation, unspecified: Secondary | ICD-10-CM | POA: Diagnosis not present

## 2020-01-05 DIAGNOSIS — I482 Chronic atrial fibrillation, unspecified: Secondary | ICD-10-CM | POA: Diagnosis not present

## 2020-01-05 DIAGNOSIS — I252 Old myocardial infarction: Secondary | ICD-10-CM | POA: Diagnosis not present

## 2020-01-05 DIAGNOSIS — G894 Chronic pain syndrome: Secondary | ICD-10-CM | POA: Diagnosis not present

## 2020-01-05 DIAGNOSIS — R339 Retention of urine, unspecified: Secondary | ICD-10-CM | POA: Diagnosis not present

## 2020-01-05 DIAGNOSIS — N1831 Chronic kidney disease, stage 3a: Secondary | ICD-10-CM | POA: Diagnosis not present

## 2020-01-05 DIAGNOSIS — S82142A Displaced bicondylar fracture of left tibia, initial encounter for closed fracture: Secondary | ICD-10-CM | POA: Diagnosis not present

## 2020-01-06 DIAGNOSIS — B0229 Other postherpetic nervous system involvement: Secondary | ICD-10-CM | POA: Diagnosis not present

## 2020-01-06 DIAGNOSIS — N1831 Chronic kidney disease, stage 3a: Secondary | ICD-10-CM | POA: Diagnosis not present

## 2020-01-06 DIAGNOSIS — D539 Nutritional anemia, unspecified: Secondary | ICD-10-CM | POA: Diagnosis not present

## 2020-01-06 DIAGNOSIS — I5020 Unspecified systolic (congestive) heart failure: Secondary | ICD-10-CM | POA: Diagnosis not present

## 2020-01-06 DIAGNOSIS — S82142A Displaced bicondylar fracture of left tibia, initial encounter for closed fracture: Secondary | ICD-10-CM | POA: Diagnosis not present

## 2020-01-06 DIAGNOSIS — I482 Chronic atrial fibrillation, unspecified: Secondary | ICD-10-CM | POA: Diagnosis not present

## 2020-01-07 DIAGNOSIS — R29898 Other symptoms and signs involving the musculoskeletal system: Secondary | ICD-10-CM | POA: Diagnosis not present

## 2020-01-07 DIAGNOSIS — D539 Nutritional anemia, unspecified: Secondary | ICD-10-CM | POA: Diagnosis not present

## 2020-01-07 DIAGNOSIS — S82142D Displaced bicondylar fracture of left tibia, subsequent encounter for closed fracture with routine healing: Secondary | ICD-10-CM | POA: Diagnosis not present

## 2020-01-07 DIAGNOSIS — Z87891 Personal history of nicotine dependence: Secondary | ICD-10-CM | POA: Diagnosis not present

## 2020-01-07 DIAGNOSIS — W19XXXD Unspecified fall, subsequent encounter: Secondary | ICD-10-CM | POA: Diagnosis not present

## 2020-01-07 DIAGNOSIS — J45909 Unspecified asthma, uncomplicated: Secondary | ICD-10-CM | POA: Diagnosis not present

## 2020-01-07 DIAGNOSIS — I482 Chronic atrial fibrillation, unspecified: Secondary | ICD-10-CM | POA: Diagnosis not present

## 2020-01-07 DIAGNOSIS — I2510 Atherosclerotic heart disease of native coronary artery without angina pectoris: Secondary | ICD-10-CM | POA: Diagnosis not present

## 2020-01-07 DIAGNOSIS — K5909 Other constipation: Secondary | ICD-10-CM | POA: Diagnosis not present

## 2020-01-07 DIAGNOSIS — Z4689 Encounter for fitting and adjustment of other specified devices: Secondary | ICD-10-CM | POA: Diagnosis not present

## 2020-01-07 DIAGNOSIS — I480 Paroxysmal atrial fibrillation: Secondary | ICD-10-CM | POA: Diagnosis not present

## 2020-01-07 DIAGNOSIS — G8929 Other chronic pain: Secondary | ICD-10-CM | POA: Diagnosis not present

## 2020-01-07 DIAGNOSIS — D638 Anemia in other chronic diseases classified elsewhere: Secondary | ICD-10-CM | POA: Diagnosis not present

## 2020-01-07 DIAGNOSIS — M25562 Pain in left knee: Secondary | ICD-10-CM | POA: Diagnosis not present

## 2020-01-07 DIAGNOSIS — R279 Unspecified lack of coordination: Secondary | ICD-10-CM | POA: Diagnosis not present

## 2020-01-07 DIAGNOSIS — K219 Gastro-esophageal reflux disease without esophagitis: Secondary | ICD-10-CM | POA: Diagnosis not present

## 2020-01-07 DIAGNOSIS — Z743 Need for continuous supervision: Secondary | ICD-10-CM | POA: Diagnosis not present

## 2020-01-07 DIAGNOSIS — Z7901 Long term (current) use of anticoagulants: Secondary | ICD-10-CM | POA: Diagnosis not present

## 2020-01-07 DIAGNOSIS — I25118 Atherosclerotic heart disease of native coronary artery with other forms of angina pectoris: Secondary | ICD-10-CM | POA: Diagnosis not present

## 2020-01-07 DIAGNOSIS — Z23 Encounter for immunization: Secondary | ICD-10-CM | POA: Diagnosis not present

## 2020-01-07 DIAGNOSIS — I5020 Unspecified systolic (congestive) heart failure: Secondary | ICD-10-CM | POA: Diagnosis not present

## 2020-01-07 DIAGNOSIS — M25462 Effusion, left knee: Secondary | ICD-10-CM | POA: Diagnosis not present

## 2020-01-07 DIAGNOSIS — B0229 Other postherpetic nervous system involvement: Secondary | ICD-10-CM | POA: Diagnosis not present

## 2020-01-07 DIAGNOSIS — I4891 Unspecified atrial fibrillation: Secondary | ICD-10-CM | POA: Diagnosis not present

## 2020-01-07 DIAGNOSIS — R52 Pain, unspecified: Secondary | ICD-10-CM | POA: Diagnosis not present

## 2020-01-07 DIAGNOSIS — Z79899 Other long term (current) drug therapy: Secondary | ICD-10-CM | POA: Diagnosis not present

## 2020-01-07 DIAGNOSIS — N1832 Chronic kidney disease, stage 3b: Secondary | ICD-10-CM | POA: Diagnosis not present

## 2020-01-07 DIAGNOSIS — S82142A Displaced bicondylar fracture of left tibia, initial encounter for closed fracture: Secondary | ICD-10-CM | POA: Diagnosis not present

## 2020-01-07 DIAGNOSIS — I272 Pulmonary hypertension, unspecified: Secondary | ICD-10-CM | POA: Diagnosis not present

## 2020-01-07 DIAGNOSIS — W01198A Fall on same level from slipping, tripping and stumbling with subsequent striking against other object, initial encounter: Secondary | ICD-10-CM | POA: Diagnosis not present

## 2020-01-07 DIAGNOSIS — Z978 Presence of other specified devices: Secondary | ICD-10-CM | POA: Diagnosis not present

## 2020-01-07 DIAGNOSIS — N183 Chronic kidney disease, stage 3 unspecified: Secondary | ICD-10-CM | POA: Diagnosis not present

## 2020-01-07 DIAGNOSIS — T402X5A Adverse effect of other opioids, initial encounter: Secondary | ICD-10-CM | POA: Diagnosis not present

## 2020-01-07 DIAGNOSIS — N1831 Chronic kidney disease, stage 3a: Secondary | ICD-10-CM | POA: Diagnosis not present

## 2020-01-07 DIAGNOSIS — W19XXXA Unspecified fall, initial encounter: Secondary | ICD-10-CM | POA: Diagnosis not present

## 2020-01-07 DIAGNOSIS — D631 Anemia in chronic kidney disease: Secondary | ICD-10-CM | POA: Diagnosis not present

## 2020-01-08 ENCOUNTER — Other Ambulatory Visit: Payer: Self-pay | Admitting: Family Medicine

## 2020-01-12 DIAGNOSIS — I272 Pulmonary hypertension, unspecified: Secondary | ICD-10-CM | POA: Diagnosis not present

## 2020-01-12 DIAGNOSIS — T402X5A Adverse effect of other opioids, initial encounter: Secondary | ICD-10-CM | POA: Diagnosis not present

## 2020-01-12 DIAGNOSIS — N1832 Chronic kidney disease, stage 3b: Secondary | ICD-10-CM | POA: Diagnosis not present

## 2020-01-12 DIAGNOSIS — D638 Anemia in other chronic diseases classified elsewhere: Secondary | ICD-10-CM | POA: Diagnosis not present

## 2020-01-12 DIAGNOSIS — S82142D Displaced bicondylar fracture of left tibia, subsequent encounter for closed fracture with routine healing: Secondary | ICD-10-CM | POA: Diagnosis not present

## 2020-01-12 DIAGNOSIS — B0229 Other postherpetic nervous system involvement: Secondary | ICD-10-CM | POA: Diagnosis not present

## 2020-01-12 DIAGNOSIS — I25118 Atherosclerotic heart disease of native coronary artery with other forms of angina pectoris: Secondary | ICD-10-CM | POA: Diagnosis not present

## 2020-01-12 DIAGNOSIS — K5909 Other constipation: Secondary | ICD-10-CM | POA: Diagnosis not present

## 2020-01-12 DIAGNOSIS — I480 Paroxysmal atrial fibrillation: Secondary | ICD-10-CM | POA: Diagnosis not present

## 2020-01-13 DIAGNOSIS — J45909 Unspecified asthma, uncomplicated: Secondary | ICD-10-CM | POA: Insufficient documentation

## 2020-01-13 DIAGNOSIS — B351 Tinea unguium: Secondary | ICD-10-CM | POA: Insufficient documentation

## 2020-01-13 DIAGNOSIS — M25562 Pain in left knee: Secondary | ICD-10-CM | POA: Diagnosis not present

## 2020-01-13 DIAGNOSIS — E785 Hyperlipidemia, unspecified: Secondary | ICD-10-CM | POA: Insufficient documentation

## 2020-01-13 DIAGNOSIS — K5900 Constipation, unspecified: Secondary | ICD-10-CM | POA: Insufficient documentation

## 2020-01-13 DIAGNOSIS — K219 Gastro-esophageal reflux disease without esophagitis: Secondary | ICD-10-CM | POA: Insufficient documentation

## 2020-01-18 ENCOUNTER — Telehealth: Payer: Self-pay

## 2020-01-18 DIAGNOSIS — Z978 Presence of other specified devices: Secondary | ICD-10-CM | POA: Diagnosis not present

## 2020-01-18 DIAGNOSIS — G8929 Other chronic pain: Secondary | ICD-10-CM | POA: Diagnosis not present

## 2020-01-18 DIAGNOSIS — S82142D Displaced bicondylar fracture of left tibia, subsequent encounter for closed fracture with routine healing: Secondary | ICD-10-CM | POA: Diagnosis not present

## 2020-01-18 NOTE — Telephone Encounter (Signed)
The rehab facility will manage her medication while she is there. She should make an appointment with [place] once she is released.

## 2020-01-18 NOTE — Telephone Encounter (Signed)
Step daughter Hinton Dyer is calling-- Pt is at [facility] rehab in [place] Staff will not administer last dosage of methadone (DOLOPHINE) 10 MG tablet. The staff was giving but now is saying they need an order from her doctor. Please call and tell them to administer this medication.

## 2020-01-18 NOTE — Telephone Encounter (Signed)
Covering PCP- please advise  

## 2020-01-18 NOTE — Telephone Encounter (Signed)
Aware and verbalizes understanding.  

## 2020-01-27 DIAGNOSIS — I480 Paroxysmal atrial fibrillation: Secondary | ICD-10-CM | POA: Diagnosis not present

## 2020-01-27 DIAGNOSIS — S82142D Displaced bicondylar fracture of left tibia, subsequent encounter for closed fracture with routine healing: Secondary | ICD-10-CM | POA: Diagnosis not present

## 2020-01-29 DIAGNOSIS — D638 Anemia in other chronic diseases classified elsewhere: Secondary | ICD-10-CM | POA: Diagnosis not present

## 2020-01-29 DIAGNOSIS — I480 Paroxysmal atrial fibrillation: Secondary | ICD-10-CM | POA: Diagnosis not present

## 2020-01-29 DIAGNOSIS — S82142D Displaced bicondylar fracture of left tibia, subsequent encounter for closed fracture with routine healing: Secondary | ICD-10-CM | POA: Diagnosis not present

## 2020-01-29 DIAGNOSIS — G8929 Other chronic pain: Secondary | ICD-10-CM | POA: Diagnosis not present

## 2020-01-29 DIAGNOSIS — N1832 Chronic kidney disease, stage 3b: Secondary | ICD-10-CM | POA: Diagnosis not present

## 2020-01-31 DIAGNOSIS — L89311 Pressure ulcer of right buttock, stage 1: Secondary | ICD-10-CM | POA: Diagnosis not present

## 2020-01-31 DIAGNOSIS — I272 Pulmonary hypertension, unspecified: Secondary | ICD-10-CM | POA: Diagnosis not present

## 2020-01-31 DIAGNOSIS — S82142D Displaced bicondylar fracture of left tibia, subsequent encounter for closed fracture with routine healing: Secondary | ICD-10-CM | POA: Diagnosis not present

## 2020-01-31 DIAGNOSIS — L89321 Pressure ulcer of left buttock, stage 1: Secondary | ICD-10-CM | POA: Diagnosis not present

## 2020-01-31 DIAGNOSIS — D631 Anemia in chronic kidney disease: Secondary | ICD-10-CM | POA: Diagnosis not present

## 2020-01-31 DIAGNOSIS — I129 Hypertensive chronic kidney disease with stage 1 through stage 4 chronic kidney disease, or unspecified chronic kidney disease: Secondary | ICD-10-CM | POA: Diagnosis not present

## 2020-01-31 DIAGNOSIS — I25118 Atherosclerotic heart disease of native coronary artery with other forms of angina pectoris: Secondary | ICD-10-CM | POA: Diagnosis not present

## 2020-01-31 DIAGNOSIS — N1832 Chronic kidney disease, stage 3b: Secondary | ICD-10-CM | POA: Diagnosis not present

## 2020-01-31 DIAGNOSIS — S82832D Other fracture of upper and lower end of left fibula, subsequent encounter for closed fracture with routine healing: Secondary | ICD-10-CM | POA: Diagnosis not present

## 2020-02-01 ENCOUNTER — Other Ambulatory Visit: Payer: Self-pay | Admitting: Family Medicine

## 2020-02-02 ENCOUNTER — Other Ambulatory Visit: Payer: Self-pay

## 2020-02-02 ENCOUNTER — Telehealth: Payer: Self-pay

## 2020-02-02 DIAGNOSIS — I272 Pulmonary hypertension, unspecified: Secondary | ICD-10-CM | POA: Diagnosis not present

## 2020-02-02 DIAGNOSIS — L89311 Pressure ulcer of right buttock, stage 1: Secondary | ICD-10-CM | POA: Diagnosis not present

## 2020-02-02 DIAGNOSIS — S82142D Displaced bicondylar fracture of left tibia, subsequent encounter for closed fracture with routine healing: Secondary | ICD-10-CM | POA: Diagnosis not present

## 2020-02-02 DIAGNOSIS — L89321 Pressure ulcer of left buttock, stage 1: Secondary | ICD-10-CM | POA: Diagnosis not present

## 2020-02-02 DIAGNOSIS — S82832D Other fracture of upper and lower end of left fibula, subsequent encounter for closed fracture with routine healing: Secondary | ICD-10-CM | POA: Diagnosis not present

## 2020-02-02 DIAGNOSIS — D631 Anemia in chronic kidney disease: Secondary | ICD-10-CM | POA: Diagnosis not present

## 2020-02-02 DIAGNOSIS — I25118 Atherosclerotic heart disease of native coronary artery with other forms of angina pectoris: Secondary | ICD-10-CM | POA: Diagnosis not present

## 2020-02-02 DIAGNOSIS — I129 Hypertensive chronic kidney disease with stage 1 through stage 4 chronic kidney disease, or unspecified chronic kidney disease: Secondary | ICD-10-CM | POA: Diagnosis not present

## 2020-02-02 DIAGNOSIS — N1832 Chronic kidney disease, stage 3b: Secondary | ICD-10-CM | POA: Diagnosis not present

## 2020-02-02 NOTE — Patient Outreach (Signed)
[facility]) Care Management  02/02/2020  Sheena Morrow [birth year] [id number]     Transition of Care Referral  Referral Date: 02/02/2020 Referral Source: [facility] Discharge Report Date of Discharge: 01/29/2020 Facility: [place]: [facility]    Referral received. Transition of care calls being completed via EMMI-automated calls. RN CM will outreach patient for any red flags received.     Plan: RN CM will close case at this time.    Enzo Montgomery, RN,BSN,CCM [facility] Telephonic Care Management Coordinator Direct Phone: [phone] Toll Free: [phone] Fax: [phone]

## 2020-02-02 NOTE — Telephone Encounter (Signed)
Please advise 

## 2020-02-02 NOTE — Telephone Encounter (Signed)
Appointment changed. Daughter aware

## 2020-02-02 NOTE — Telephone Encounter (Signed)
Please convert to televisit

## 2020-02-04 ENCOUNTER — Telehealth: Payer: Self-pay

## 2020-02-04 DIAGNOSIS — L89321 Pressure ulcer of left buttock, stage 1: Secondary | ICD-10-CM | POA: Diagnosis not present

## 2020-02-04 DIAGNOSIS — S82142D Displaced bicondylar fracture of left tibia, subsequent encounter for closed fracture with routine healing: Secondary | ICD-10-CM | POA: Diagnosis not present

## 2020-02-04 DIAGNOSIS — L89311 Pressure ulcer of right buttock, stage 1: Secondary | ICD-10-CM | POA: Diagnosis not present

## 2020-02-04 DIAGNOSIS — M25462 Effusion, left knee: Secondary | ICD-10-CM | POA: Diagnosis not present

## 2020-02-04 DIAGNOSIS — S82141A Displaced bicondylar fracture of right tibia, initial encounter for closed fracture: Secondary | ICD-10-CM | POA: Diagnosis not present

## 2020-02-04 DIAGNOSIS — N1832 Chronic kidney disease, stage 3b: Secondary | ICD-10-CM | POA: Diagnosis not present

## 2020-02-04 DIAGNOSIS — I25118 Atherosclerotic heart disease of native coronary artery with other forms of angina pectoris: Secondary | ICD-10-CM | POA: Diagnosis not present

## 2020-02-04 DIAGNOSIS — S82832D Other fracture of upper and lower end of left fibula, subsequent encounter for closed fracture with routine healing: Secondary | ICD-10-CM | POA: Diagnosis not present

## 2020-02-04 DIAGNOSIS — D631 Anemia in chronic kidney disease: Secondary | ICD-10-CM | POA: Diagnosis not present

## 2020-02-04 DIAGNOSIS — I129 Hypertensive chronic kidney disease with stage 1 through stage 4 chronic kidney disease, or unspecified chronic kidney disease: Secondary | ICD-10-CM | POA: Diagnosis not present

## 2020-02-04 DIAGNOSIS — I272 Pulmonary hypertension, unspecified: Secondary | ICD-10-CM | POA: Diagnosis not present

## 2020-02-04 NOTE — Telephone Encounter (Signed)
Diane called with [facility] stating that they need initial PT order for patient to have PT 1x per week for 5wks, with start date of 02/02/2020.  Can call Diane at [phone]

## 2020-02-04 NOTE — Telephone Encounter (Signed)
LMOVM giving VO for PT

## 2020-02-05 DIAGNOSIS — I25118 Atherosclerotic heart disease of native coronary artery with other forms of angina pectoris: Secondary | ICD-10-CM | POA: Diagnosis not present

## 2020-02-05 DIAGNOSIS — S82142D Displaced bicondylar fracture of left tibia, subsequent encounter for closed fracture with routine healing: Secondary | ICD-10-CM | POA: Diagnosis not present

## 2020-02-05 DIAGNOSIS — L89321 Pressure ulcer of left buttock, stage 1: Secondary | ICD-10-CM | POA: Diagnosis not present

## 2020-02-05 DIAGNOSIS — S82832D Other fracture of upper and lower end of left fibula, subsequent encounter for closed fracture with routine healing: Secondary | ICD-10-CM | POA: Diagnosis not present

## 2020-02-05 DIAGNOSIS — I129 Hypertensive chronic kidney disease with stage 1 through stage 4 chronic kidney disease, or unspecified chronic kidney disease: Secondary | ICD-10-CM | POA: Diagnosis not present

## 2020-02-05 DIAGNOSIS — D631 Anemia in chronic kidney disease: Secondary | ICD-10-CM | POA: Diagnosis not present

## 2020-02-05 DIAGNOSIS — L89311 Pressure ulcer of right buttock, stage 1: Secondary | ICD-10-CM | POA: Diagnosis not present

## 2020-02-05 DIAGNOSIS — I272 Pulmonary hypertension, unspecified: Secondary | ICD-10-CM | POA: Diagnosis not present

## 2020-02-05 DIAGNOSIS — N1832 Chronic kidney disease, stage 3b: Secondary | ICD-10-CM | POA: Diagnosis not present

## 2020-02-08 ENCOUNTER — Ambulatory Visit: Payer: Medicare HMO | Admitting: Family Medicine

## 2020-02-09 DIAGNOSIS — I25118 Atherosclerotic heart disease of native coronary artery with other forms of angina pectoris: Secondary | ICD-10-CM | POA: Diagnosis not present

## 2020-02-09 DIAGNOSIS — S82142D Displaced bicondylar fracture of left tibia, subsequent encounter for closed fracture with routine healing: Secondary | ICD-10-CM | POA: Diagnosis not present

## 2020-02-09 DIAGNOSIS — S82832D Other fracture of upper and lower end of left fibula, subsequent encounter for closed fracture with routine healing: Secondary | ICD-10-CM | POA: Diagnosis not present

## 2020-02-09 DIAGNOSIS — L89321 Pressure ulcer of left buttock, stage 1: Secondary | ICD-10-CM | POA: Diagnosis not present

## 2020-02-09 DIAGNOSIS — I272 Pulmonary hypertension, unspecified: Secondary | ICD-10-CM | POA: Diagnosis not present

## 2020-02-09 DIAGNOSIS — I129 Hypertensive chronic kidney disease with stage 1 through stage 4 chronic kidney disease, or unspecified chronic kidney disease: Secondary | ICD-10-CM | POA: Diagnosis not present

## 2020-02-09 DIAGNOSIS — D631 Anemia in chronic kidney disease: Secondary | ICD-10-CM | POA: Diagnosis not present

## 2020-02-09 DIAGNOSIS — L89311 Pressure ulcer of right buttock, stage 1: Secondary | ICD-10-CM | POA: Diagnosis not present

## 2020-02-09 DIAGNOSIS — N1832 Chronic kidney disease, stage 3b: Secondary | ICD-10-CM | POA: Diagnosis not present

## 2020-02-10 DIAGNOSIS — R6889 Other general symptoms and signs: Secondary | ICD-10-CM | POA: Diagnosis not present

## 2020-02-10 DIAGNOSIS — M25562 Pain in left knee: Secondary | ICD-10-CM | POA: Diagnosis not present

## 2020-02-11 DIAGNOSIS — I25118 Atherosclerotic heart disease of native coronary artery with other forms of angina pectoris: Secondary | ICD-10-CM | POA: Diagnosis not present

## 2020-02-11 DIAGNOSIS — S82832D Other fracture of upper and lower end of left fibula, subsequent encounter for closed fracture with routine healing: Secondary | ICD-10-CM | POA: Diagnosis not present

## 2020-02-11 DIAGNOSIS — I129 Hypertensive chronic kidney disease with stage 1 through stage 4 chronic kidney disease, or unspecified chronic kidney disease: Secondary | ICD-10-CM | POA: Diagnosis not present

## 2020-02-11 DIAGNOSIS — L89311 Pressure ulcer of right buttock, stage 1: Secondary | ICD-10-CM | POA: Diagnosis not present

## 2020-02-11 DIAGNOSIS — I272 Pulmonary hypertension, unspecified: Secondary | ICD-10-CM | POA: Diagnosis not present

## 2020-02-11 DIAGNOSIS — N1832 Chronic kidney disease, stage 3b: Secondary | ICD-10-CM | POA: Diagnosis not present

## 2020-02-11 DIAGNOSIS — D631 Anemia in chronic kidney disease: Secondary | ICD-10-CM | POA: Diagnosis not present

## 2020-02-11 DIAGNOSIS — S82142D Displaced bicondylar fracture of left tibia, subsequent encounter for closed fracture with routine healing: Secondary | ICD-10-CM | POA: Diagnosis not present

## 2020-02-11 DIAGNOSIS — L89321 Pressure ulcer of left buttock, stage 1: Secondary | ICD-10-CM | POA: Diagnosis not present

## 2020-02-12 DIAGNOSIS — N1832 Chronic kidney disease, stage 3b: Secondary | ICD-10-CM | POA: Diagnosis not present

## 2020-02-12 DIAGNOSIS — S82142D Displaced bicondylar fracture of left tibia, subsequent encounter for closed fracture with routine healing: Secondary | ICD-10-CM | POA: Diagnosis not present

## 2020-02-12 DIAGNOSIS — D631 Anemia in chronic kidney disease: Secondary | ICD-10-CM | POA: Diagnosis not present

## 2020-02-12 DIAGNOSIS — S82832D Other fracture of upper and lower end of left fibula, subsequent encounter for closed fracture with routine healing: Secondary | ICD-10-CM | POA: Diagnosis not present

## 2020-02-12 DIAGNOSIS — L89311 Pressure ulcer of right buttock, stage 1: Secondary | ICD-10-CM | POA: Diagnosis not present

## 2020-02-12 DIAGNOSIS — I25118 Atherosclerotic heart disease of native coronary artery with other forms of angina pectoris: Secondary | ICD-10-CM | POA: Diagnosis not present

## 2020-02-12 DIAGNOSIS — I272 Pulmonary hypertension, unspecified: Secondary | ICD-10-CM | POA: Diagnosis not present

## 2020-02-12 DIAGNOSIS — L89321 Pressure ulcer of left buttock, stage 1: Secondary | ICD-10-CM | POA: Diagnosis not present

## 2020-02-12 DIAGNOSIS — I129 Hypertensive chronic kidney disease with stage 1 through stage 4 chronic kidney disease, or unspecified chronic kidney disease: Secondary | ICD-10-CM | POA: Diagnosis not present

## 2020-02-16 DIAGNOSIS — E785 Hyperlipidemia, unspecified: Secondary | ICD-10-CM | POA: Diagnosis not present

## 2020-02-16 DIAGNOSIS — Z9181 History of falling: Secondary | ICD-10-CM | POA: Diagnosis not present

## 2020-02-16 DIAGNOSIS — D649 Anemia, unspecified: Secondary | ICD-10-CM | POA: Diagnosis not present

## 2020-02-16 DIAGNOSIS — S82115A Nondisplaced fracture of left tibial spine, initial encounter for closed fracture: Secondary | ICD-10-CM | POA: Diagnosis not present

## 2020-02-16 DIAGNOSIS — Z7901 Long term (current) use of anticoagulants: Secondary | ICD-10-CM | POA: Diagnosis not present

## 2020-02-16 DIAGNOSIS — Z87891 Personal history of nicotine dependence: Secondary | ICD-10-CM | POA: Diagnosis not present

## 2020-02-16 DIAGNOSIS — G9009 Other idiopathic peripheral autonomic neuropathy: Secondary | ICD-10-CM | POA: Diagnosis not present

## 2020-02-16 DIAGNOSIS — I10 Essential (primary) hypertension: Secondary | ICD-10-CM | POA: Diagnosis not present

## 2020-02-16 DIAGNOSIS — M25562 Pain in left knee: Secondary | ICD-10-CM | POA: Diagnosis not present

## 2020-02-17 DIAGNOSIS — S82142D Displaced bicondylar fracture of left tibia, subsequent encounter for closed fracture with routine healing: Secondary | ICD-10-CM | POA: Diagnosis not present

## 2020-02-17 DIAGNOSIS — I272 Pulmonary hypertension, unspecified: Secondary | ICD-10-CM | POA: Diagnosis not present

## 2020-02-17 DIAGNOSIS — D631 Anemia in chronic kidney disease: Secondary | ICD-10-CM | POA: Diagnosis not present

## 2020-02-17 DIAGNOSIS — I25118 Atherosclerotic heart disease of native coronary artery with other forms of angina pectoris: Secondary | ICD-10-CM | POA: Diagnosis not present

## 2020-02-17 DIAGNOSIS — S82832D Other fracture of upper and lower end of left fibula, subsequent encounter for closed fracture with routine healing: Secondary | ICD-10-CM | POA: Diagnosis not present

## 2020-02-17 DIAGNOSIS — N1832 Chronic kidney disease, stage 3b: Secondary | ICD-10-CM | POA: Diagnosis not present

## 2020-02-17 DIAGNOSIS — L89311 Pressure ulcer of right buttock, stage 1: Secondary | ICD-10-CM | POA: Diagnosis not present

## 2020-02-17 DIAGNOSIS — L89321 Pressure ulcer of left buttock, stage 1: Secondary | ICD-10-CM | POA: Diagnosis not present

## 2020-02-17 DIAGNOSIS — I129 Hypertensive chronic kidney disease with stage 1 through stage 4 chronic kidney disease, or unspecified chronic kidney disease: Secondary | ICD-10-CM | POA: Diagnosis not present

## 2020-02-18 DIAGNOSIS — Z87891 Personal history of nicotine dependence: Secondary | ICD-10-CM | POA: Diagnosis not present

## 2020-02-18 DIAGNOSIS — S82142D Displaced bicondylar fracture of left tibia, subsequent encounter for closed fracture with routine healing: Secondary | ICD-10-CM | POA: Diagnosis not present

## 2020-02-18 DIAGNOSIS — N1832 Chronic kidney disease, stage 3b: Secondary | ICD-10-CM | POA: Diagnosis not present

## 2020-02-18 DIAGNOSIS — Z7901 Long term (current) use of anticoagulants: Secondary | ICD-10-CM | POA: Diagnosis not present

## 2020-02-18 DIAGNOSIS — I25118 Atherosclerotic heart disease of native coronary artery with other forms of angina pectoris: Secondary | ICD-10-CM | POA: Diagnosis not present

## 2020-02-18 DIAGNOSIS — L89321 Pressure ulcer of left buttock, stage 1: Secondary | ICD-10-CM | POA: Diagnosis not present

## 2020-02-18 DIAGNOSIS — L89311 Pressure ulcer of right buttock, stage 1: Secondary | ICD-10-CM | POA: Diagnosis not present

## 2020-02-18 DIAGNOSIS — I129 Hypertensive chronic kidney disease with stage 1 through stage 4 chronic kidney disease, or unspecified chronic kidney disease: Secondary | ICD-10-CM | POA: Diagnosis not present

## 2020-02-18 DIAGNOSIS — G9009 Other idiopathic peripheral autonomic neuropathy: Secondary | ICD-10-CM | POA: Diagnosis not present

## 2020-02-18 DIAGNOSIS — D631 Anemia in chronic kidney disease: Secondary | ICD-10-CM | POA: Diagnosis not present

## 2020-02-18 DIAGNOSIS — D649 Anemia, unspecified: Secondary | ICD-10-CM | POA: Diagnosis not present

## 2020-02-18 DIAGNOSIS — I272 Pulmonary hypertension, unspecified: Secondary | ICD-10-CM | POA: Diagnosis not present

## 2020-02-18 DIAGNOSIS — S82832D Other fracture of upper and lower end of left fibula, subsequent encounter for closed fracture with routine healing: Secondary | ICD-10-CM | POA: Diagnosis not present

## 2020-02-18 DIAGNOSIS — S82115A Nondisplaced fracture of left tibial spine, initial encounter for closed fracture: Secondary | ICD-10-CM | POA: Diagnosis not present

## 2020-02-18 DIAGNOSIS — Z9181 History of falling: Secondary | ICD-10-CM | POA: Diagnosis not present

## 2020-02-18 DIAGNOSIS — E785 Hyperlipidemia, unspecified: Secondary | ICD-10-CM | POA: Diagnosis not present

## 2020-02-18 DIAGNOSIS — M25562 Pain in left knee: Secondary | ICD-10-CM | POA: Diagnosis not present

## 2020-02-18 DIAGNOSIS — I10 Essential (primary) hypertension: Secondary | ICD-10-CM | POA: Diagnosis not present

## 2020-02-23 ENCOUNTER — Ambulatory Visit (INDEPENDENT_AMBULATORY_CARE_PROVIDER_SITE_OTHER): Payer: Medicare HMO | Admitting: Family Medicine

## 2020-02-23 DIAGNOSIS — B0229 Other postherpetic nervous system involvement: Secondary | ICD-10-CM

## 2020-02-23 DIAGNOSIS — S82142A Displaced bicondylar fracture of left tibia, initial encounter for closed fracture: Secondary | ICD-10-CM | POA: Diagnosis not present

## 2020-02-23 MED ORDER — METHADONE HCL 10 MG PO TABS
10.0000 mg | ORAL_TABLET | Freq: Three times a day (TID) | ORAL | 0 refills | Status: DC
Start: 2020-04-23 — End: 2020-05-23

## 2020-02-23 MED ORDER — METHADONE HCL 10 MG PO TABS: 10.0000 mg | ORAL_TABLET | Freq: Three times a day (TID) | ORAL | 0 refills | Status: DC

## 2020-02-23 MED ORDER — METHADONE HCL 10 MG PO TABS
10.0000 mg | ORAL_TABLET | Freq: Three times a day (TID) | ORAL | 0 refills | Status: DC
Start: 2020-03-24 — End: 2020-05-23

## 2020-02-23 NOTE — Progress Notes (Signed)
Subjective:    Patient ID: Sheena Morrow, female    DOB: 1933-08-12, 87 y.o.   MRN: [id number]   HPI: Sheena Morrow is a 87 y.o. female presenting for pain recheck.   Broke her left leg at the knee on Nov. 30.  Review of the care everywhere function shows that it is a left tibial plateau fracture.  Now at home after rehab. Did not require surgery. Cannot wak. Cannot bear weight. Next Ortho appointment is Feb. 9. Living on second floor apartment and can't walk to go up or down stairs.  Patient still taking methadone 3 times a day.  Unchanged for over a decade.  She has very little pain as long as she takes her medication usually.[name] has a long-term history of herpetic neuralgia.  This pain is still under good control, she is having pain from the current injury.  PDMP shows overdose risk score of 230.  Her sedative score was 160 and her narcotic score was 391.  There are no discrepancies in her filling history.  However, there was a 2-week gap since the last fill was eligible until it was actually filled.  This corresponds to her time and treatment for her recent fracture.  Depression screen [facility] 2/9 11/05/2019 08/12/2019 07/01/2019 06/24/2019 05/13/2019  Decreased Interest 0 0 0 0 0  Down, Depressed, Hopeless 0 0 0 0 0  PHQ - 2 Score 0 0 0 0 0     Relevant past medical, surgical, family and social history reviewed and updated as indicated.  Interim medical history since our last visit reviewed. Allergies and medications reviewed and updated.  ROS:  Review of Systems  Constitutional: Negative.   HENT: Negative.   Eyes: Negative for visual disturbance.  Respiratory: Negative for shortness of breath.   Cardiovascular: Negative for chest pain.  Gastrointestinal: Negative for abdominal pain.  Musculoskeletal: Positive for gait problem and joint swelling.     Social History   Tobacco Use  Smoking Status Former Smoker  . Packs/day: 0.50  . Types: Cigarettes  . Start date: 04/01/1969   . Quit date: 04/01/2008  . Years since quitting: 11.9  Smokeless Tobacco Never Used       Objective:     Wt Readings from Last 3 Encounters:  11/05/19 131 lb (59.4 kg)  08/12/19 128 lb (58.1 kg)  06/24/19 134 lb 4 oz (60.9 kg)     Exam deferred. Pt. Harboring due to COVID 19. Phone visit performed.   Assessment & Plan:   1. Closed fracture of left tibial plateau, initial encounter   2. Postherpetic neuralgia     Meds ordered this encounter  Medications  . methadone (DOLOPHINE) 10 MG tablet    Sig: Take 1 tablet (10 mg total) by mouth 3 (three) times daily.    Dispense:  90 tablet    Refill:  0  . methadone (DOLOPHINE) 10 MG tablet    Sig: Take 1 tablet (10 mg total) by mouth 3 (three) times daily.    Dispense:  90 tablet    Refill:  0  . methadone (DOLOPHINE) 10 MG tablet    Sig: Take 1 tablet (10 mg total) by mouth 3 (three) times daily.    Dispense:  90 tablet    Refill:  0    No orders of the defined types were placed in this encounter.     Diagnoses and all orders for this visit:  Closed fracture of left tibial plateau, initial encounter  Postherpetic neuralgia -     methadone (DOLOPHINE) 10 MG tablet; Take 1 tablet (10 mg total) by mouth 3 (three) times daily. -     methadone (DOLOPHINE) 10 MG tablet; Take 1 tablet (10 mg total) by mouth 3 (three) times daily.  Other orders -     methadone (DOLOPHINE) 10 MG tablet; Take 1 tablet (10 mg total) by mouth 3 (three) times daily.    Virtual Visit via telephone Note  I discussed the limitations, risks, security and privacy concerns of performing an evaluation and management service by telephone and the availability of in person appointments. The patient was identified with two identifiers. Pt.expressed understanding and agreed to proceed. Pt. Is at home. Dr. Livia Snellen is in his office.  Follow Up Instructions:   I discussed the assessment and treatment plan with the patient. The patient was provided an  opportunity to ask questions and all were answered. The patient agreed with the plan and demonstrated an understanding of the instructions.   The patient was advised to call back or seek an in-person evaluation if the symptoms worsen or if the condition fails to improve as anticipated.   Total minutes including chart review and phone contact time: 26   Follow up plan: Return in about 3 months (around 05/23/2020).  Claretta Fraise, MD [place]

## 2020-02-24 DIAGNOSIS — D631 Anemia in chronic kidney disease: Secondary | ICD-10-CM | POA: Diagnosis not present

## 2020-02-24 DIAGNOSIS — I272 Pulmonary hypertension, unspecified: Secondary | ICD-10-CM | POA: Diagnosis not present

## 2020-02-24 DIAGNOSIS — L89311 Pressure ulcer of right buttock, stage 1: Secondary | ICD-10-CM | POA: Diagnosis not present

## 2020-02-24 DIAGNOSIS — I25118 Atherosclerotic heart disease of native coronary artery with other forms of angina pectoris: Secondary | ICD-10-CM | POA: Diagnosis not present

## 2020-02-24 DIAGNOSIS — S82142D Displaced bicondylar fracture of left tibia, subsequent encounter for closed fracture with routine healing: Secondary | ICD-10-CM | POA: Diagnosis not present

## 2020-02-24 DIAGNOSIS — S82832D Other fracture of upper and lower end of left fibula, subsequent encounter for closed fracture with routine healing: Secondary | ICD-10-CM | POA: Diagnosis not present

## 2020-02-24 DIAGNOSIS — I129 Hypertensive chronic kidney disease with stage 1 through stage 4 chronic kidney disease, or unspecified chronic kidney disease: Secondary | ICD-10-CM | POA: Diagnosis not present

## 2020-02-24 DIAGNOSIS — L89321 Pressure ulcer of left buttock, stage 1: Secondary | ICD-10-CM | POA: Diagnosis not present

## 2020-02-24 DIAGNOSIS — N1832 Chronic kidney disease, stage 3b: Secondary | ICD-10-CM | POA: Diagnosis not present

## 2020-02-25 DIAGNOSIS — N1832 Chronic kidney disease, stage 3b: Secondary | ICD-10-CM | POA: Diagnosis not present

## 2020-02-25 DIAGNOSIS — I272 Pulmonary hypertension, unspecified: Secondary | ICD-10-CM | POA: Diagnosis not present

## 2020-02-25 DIAGNOSIS — S82832D Other fracture of upper and lower end of left fibula, subsequent encounter for closed fracture with routine healing: Secondary | ICD-10-CM | POA: Diagnosis not present

## 2020-02-25 DIAGNOSIS — I25118 Atherosclerotic heart disease of native coronary artery with other forms of angina pectoris: Secondary | ICD-10-CM | POA: Diagnosis not present

## 2020-02-25 DIAGNOSIS — S82142D Displaced bicondylar fracture of left tibia, subsequent encounter for closed fracture with routine healing: Secondary | ICD-10-CM | POA: Diagnosis not present

## 2020-02-25 DIAGNOSIS — D631 Anemia in chronic kidney disease: Secondary | ICD-10-CM | POA: Diagnosis not present

## 2020-02-25 DIAGNOSIS — L89321 Pressure ulcer of left buttock, stage 1: Secondary | ICD-10-CM | POA: Diagnosis not present

## 2020-02-25 DIAGNOSIS — I129 Hypertensive chronic kidney disease with stage 1 through stage 4 chronic kidney disease, or unspecified chronic kidney disease: Secondary | ICD-10-CM | POA: Diagnosis not present

## 2020-02-25 DIAGNOSIS — L89311 Pressure ulcer of right buttock, stage 1: Secondary | ICD-10-CM | POA: Diagnosis not present

## 2020-02-26 DIAGNOSIS — S82142D Displaced bicondylar fracture of left tibia, subsequent encounter for closed fracture with routine healing: Secondary | ICD-10-CM | POA: Diagnosis not present

## 2020-02-26 DIAGNOSIS — I272 Pulmonary hypertension, unspecified: Secondary | ICD-10-CM | POA: Diagnosis not present

## 2020-02-26 DIAGNOSIS — L89311 Pressure ulcer of right buttock, stage 1: Secondary | ICD-10-CM | POA: Diagnosis not present

## 2020-02-26 DIAGNOSIS — I25118 Atherosclerotic heart disease of native coronary artery with other forms of angina pectoris: Secondary | ICD-10-CM | POA: Diagnosis not present

## 2020-02-26 DIAGNOSIS — S82832D Other fracture of upper and lower end of left fibula, subsequent encounter for closed fracture with routine healing: Secondary | ICD-10-CM | POA: Diagnosis not present

## 2020-02-26 DIAGNOSIS — D631 Anemia in chronic kidney disease: Secondary | ICD-10-CM | POA: Diagnosis not present

## 2020-02-26 DIAGNOSIS — L89321 Pressure ulcer of left buttock, stage 1: Secondary | ICD-10-CM | POA: Diagnosis not present

## 2020-02-26 DIAGNOSIS — I129 Hypertensive chronic kidney disease with stage 1 through stage 4 chronic kidney disease, or unspecified chronic kidney disease: Secondary | ICD-10-CM | POA: Diagnosis not present

## 2020-02-26 DIAGNOSIS — N1832 Chronic kidney disease, stage 3b: Secondary | ICD-10-CM | POA: Diagnosis not present

## 2020-02-28 ENCOUNTER — Encounter: Payer: Self-pay | Admitting: Family Medicine

## 2020-02-29 DIAGNOSIS — I272 Pulmonary hypertension, unspecified: Secondary | ICD-10-CM | POA: Diagnosis not present

## 2020-02-29 DIAGNOSIS — I129 Hypertensive chronic kidney disease with stage 1 through stage 4 chronic kidney disease, or unspecified chronic kidney disease: Secondary | ICD-10-CM | POA: Diagnosis not present

## 2020-02-29 DIAGNOSIS — S82142D Displaced bicondylar fracture of left tibia, subsequent encounter for closed fracture with routine healing: Secondary | ICD-10-CM | POA: Diagnosis not present

## 2020-02-29 DIAGNOSIS — D631 Anemia in chronic kidney disease: Secondary | ICD-10-CM | POA: Diagnosis not present

## 2020-02-29 DIAGNOSIS — N1832 Chronic kidney disease, stage 3b: Secondary | ICD-10-CM | POA: Diagnosis not present

## 2020-02-29 DIAGNOSIS — I25118 Atherosclerotic heart disease of native coronary artery with other forms of angina pectoris: Secondary | ICD-10-CM | POA: Diagnosis not present

## 2020-02-29 DIAGNOSIS — L89311 Pressure ulcer of right buttock, stage 1: Secondary | ICD-10-CM | POA: Diagnosis not present

## 2020-02-29 DIAGNOSIS — L89321 Pressure ulcer of left buttock, stage 1: Secondary | ICD-10-CM | POA: Diagnosis not present

## 2020-02-29 DIAGNOSIS — S82832D Other fracture of upper and lower end of left fibula, subsequent encounter for closed fracture with routine healing: Secondary | ICD-10-CM | POA: Diagnosis not present

## 2020-03-01 DIAGNOSIS — I272 Pulmonary hypertension, unspecified: Secondary | ICD-10-CM | POA: Diagnosis not present

## 2020-03-01 DIAGNOSIS — L89321 Pressure ulcer of left buttock, stage 1: Secondary | ICD-10-CM | POA: Diagnosis not present

## 2020-03-01 DIAGNOSIS — S82832D Other fracture of upper and lower end of left fibula, subsequent encounter for closed fracture with routine healing: Secondary | ICD-10-CM | POA: Diagnosis not present

## 2020-03-01 DIAGNOSIS — N1832 Chronic kidney disease, stage 3b: Secondary | ICD-10-CM | POA: Diagnosis not present

## 2020-03-01 DIAGNOSIS — I129 Hypertensive chronic kidney disease with stage 1 through stage 4 chronic kidney disease, or unspecified chronic kidney disease: Secondary | ICD-10-CM | POA: Diagnosis not present

## 2020-03-01 DIAGNOSIS — D631 Anemia in chronic kidney disease: Secondary | ICD-10-CM | POA: Diagnosis not present

## 2020-03-01 DIAGNOSIS — S82142D Displaced bicondylar fracture of left tibia, subsequent encounter for closed fracture with routine healing: Secondary | ICD-10-CM | POA: Diagnosis not present

## 2020-03-01 DIAGNOSIS — I25118 Atherosclerotic heart disease of native coronary artery with other forms of angina pectoris: Secondary | ICD-10-CM | POA: Diagnosis not present

## 2020-03-01 DIAGNOSIS — L89311 Pressure ulcer of right buttock, stage 1: Secondary | ICD-10-CM | POA: Diagnosis not present

## 2020-03-02 DIAGNOSIS — N1832 Chronic kidney disease, stage 3b: Secondary | ICD-10-CM | POA: Diagnosis not present

## 2020-03-02 DIAGNOSIS — L89311 Pressure ulcer of right buttock, stage 1: Secondary | ICD-10-CM | POA: Diagnosis not present

## 2020-03-02 DIAGNOSIS — I272 Pulmonary hypertension, unspecified: Secondary | ICD-10-CM | POA: Diagnosis not present

## 2020-03-02 DIAGNOSIS — L89321 Pressure ulcer of left buttock, stage 1: Secondary | ICD-10-CM | POA: Diagnosis not present

## 2020-03-02 DIAGNOSIS — S82142D Displaced bicondylar fracture of left tibia, subsequent encounter for closed fracture with routine healing: Secondary | ICD-10-CM | POA: Diagnosis not present

## 2020-03-02 DIAGNOSIS — S82832D Other fracture of upper and lower end of left fibula, subsequent encounter for closed fracture with routine healing: Secondary | ICD-10-CM | POA: Diagnosis not present

## 2020-03-02 DIAGNOSIS — I25118 Atherosclerotic heart disease of native coronary artery with other forms of angina pectoris: Secondary | ICD-10-CM | POA: Diagnosis not present

## 2020-03-02 DIAGNOSIS — I129 Hypertensive chronic kidney disease with stage 1 through stage 4 chronic kidney disease, or unspecified chronic kidney disease: Secondary | ICD-10-CM | POA: Diagnosis not present

## 2020-03-02 DIAGNOSIS — D631 Anemia in chronic kidney disease: Secondary | ICD-10-CM | POA: Diagnosis not present

## 2020-03-03 DIAGNOSIS — S82142D Displaced bicondylar fracture of left tibia, subsequent encounter for closed fracture with routine healing: Secondary | ICD-10-CM | POA: Diagnosis not present

## 2020-03-03 DIAGNOSIS — I272 Pulmonary hypertension, unspecified: Secondary | ICD-10-CM | POA: Diagnosis not present

## 2020-03-03 DIAGNOSIS — L89321 Pressure ulcer of left buttock, stage 1: Secondary | ICD-10-CM | POA: Diagnosis not present

## 2020-03-03 DIAGNOSIS — N1832 Chronic kidney disease, stage 3b: Secondary | ICD-10-CM | POA: Diagnosis not present

## 2020-03-03 DIAGNOSIS — D631 Anemia in chronic kidney disease: Secondary | ICD-10-CM | POA: Diagnosis not present

## 2020-03-03 DIAGNOSIS — I25118 Atherosclerotic heart disease of native coronary artery with other forms of angina pectoris: Secondary | ICD-10-CM | POA: Diagnosis not present

## 2020-03-03 DIAGNOSIS — L89311 Pressure ulcer of right buttock, stage 1: Secondary | ICD-10-CM | POA: Diagnosis not present

## 2020-03-03 DIAGNOSIS — S82832D Other fracture of upper and lower end of left fibula, subsequent encounter for closed fracture with routine healing: Secondary | ICD-10-CM | POA: Diagnosis not present

## 2020-03-03 DIAGNOSIS — I129 Hypertensive chronic kidney disease with stage 1 through stage 4 chronic kidney disease, or unspecified chronic kidney disease: Secondary | ICD-10-CM | POA: Diagnosis not present

## 2020-03-06 DIAGNOSIS — M25462 Effusion, left knee: Secondary | ICD-10-CM | POA: Diagnosis not present

## 2020-03-06 DIAGNOSIS — S82141A Displaced bicondylar fracture of right tibia, initial encounter for closed fracture: Secondary | ICD-10-CM | POA: Diagnosis not present

## 2020-03-09 DIAGNOSIS — M25562 Pain in left knee: Secondary | ICD-10-CM | POA: Diagnosis not present

## 2020-03-10 DIAGNOSIS — N1832 Chronic kidney disease, stage 3b: Secondary | ICD-10-CM | POA: Diagnosis not present

## 2020-03-10 DIAGNOSIS — L89311 Pressure ulcer of right buttock, stage 1: Secondary | ICD-10-CM | POA: Diagnosis not present

## 2020-03-10 DIAGNOSIS — D631 Anemia in chronic kidney disease: Secondary | ICD-10-CM | POA: Diagnosis not present

## 2020-03-10 DIAGNOSIS — I129 Hypertensive chronic kidney disease with stage 1 through stage 4 chronic kidney disease, or unspecified chronic kidney disease: Secondary | ICD-10-CM | POA: Diagnosis not present

## 2020-03-10 DIAGNOSIS — S82142D Displaced bicondylar fracture of left tibia, subsequent encounter for closed fracture with routine healing: Secondary | ICD-10-CM | POA: Diagnosis not present

## 2020-03-10 DIAGNOSIS — L89321 Pressure ulcer of left buttock, stage 1: Secondary | ICD-10-CM | POA: Diagnosis not present

## 2020-03-10 DIAGNOSIS — S82832D Other fracture of upper and lower end of left fibula, subsequent encounter for closed fracture with routine healing: Secondary | ICD-10-CM | POA: Diagnosis not present

## 2020-03-10 DIAGNOSIS — I272 Pulmonary hypertension, unspecified: Secondary | ICD-10-CM | POA: Diagnosis not present

## 2020-03-10 DIAGNOSIS — I25118 Atherosclerotic heart disease of native coronary artery with other forms of angina pectoris: Secondary | ICD-10-CM | POA: Diagnosis not present

## 2020-03-11 DIAGNOSIS — S82832D Other fracture of upper and lower end of left fibula, subsequent encounter for closed fracture with routine healing: Secondary | ICD-10-CM | POA: Diagnosis not present

## 2020-03-11 DIAGNOSIS — I272 Pulmonary hypertension, unspecified: Secondary | ICD-10-CM | POA: Diagnosis not present

## 2020-03-11 DIAGNOSIS — N1832 Chronic kidney disease, stage 3b: Secondary | ICD-10-CM | POA: Diagnosis not present

## 2020-03-11 DIAGNOSIS — L89311 Pressure ulcer of right buttock, stage 1: Secondary | ICD-10-CM | POA: Diagnosis not present

## 2020-03-11 DIAGNOSIS — L89321 Pressure ulcer of left buttock, stage 1: Secondary | ICD-10-CM | POA: Diagnosis not present

## 2020-03-11 DIAGNOSIS — I129 Hypertensive chronic kidney disease with stage 1 through stage 4 chronic kidney disease, or unspecified chronic kidney disease: Secondary | ICD-10-CM | POA: Diagnosis not present

## 2020-03-11 DIAGNOSIS — D631 Anemia in chronic kidney disease: Secondary | ICD-10-CM | POA: Diagnosis not present

## 2020-03-11 DIAGNOSIS — I25118 Atherosclerotic heart disease of native coronary artery with other forms of angina pectoris: Secondary | ICD-10-CM | POA: Diagnosis not present

## 2020-03-11 DIAGNOSIS — S82142D Displaced bicondylar fracture of left tibia, subsequent encounter for closed fracture with routine healing: Secondary | ICD-10-CM | POA: Diagnosis not present

## 2020-03-15 ENCOUNTER — Telehealth: Payer: Self-pay

## 2020-03-15 NOTE — Telephone Encounter (Signed)
Just the xarelto.

## 2020-03-16 DIAGNOSIS — I25118 Atherosclerotic heart disease of native coronary artery with other forms of angina pectoris: Secondary | ICD-10-CM | POA: Diagnosis not present

## 2020-03-16 DIAGNOSIS — N1832 Chronic kidney disease, stage 3b: Secondary | ICD-10-CM | POA: Diagnosis not present

## 2020-03-16 DIAGNOSIS — S82142D Displaced bicondylar fracture of left tibia, subsequent encounter for closed fracture with routine healing: Secondary | ICD-10-CM | POA: Diagnosis not present

## 2020-03-16 DIAGNOSIS — L89311 Pressure ulcer of right buttock, stage 1: Secondary | ICD-10-CM | POA: Diagnosis not present

## 2020-03-16 DIAGNOSIS — I129 Hypertensive chronic kidney disease with stage 1 through stage 4 chronic kidney disease, or unspecified chronic kidney disease: Secondary | ICD-10-CM | POA: Diagnosis not present

## 2020-03-16 DIAGNOSIS — S82832D Other fracture of upper and lower end of left fibula, subsequent encounter for closed fracture with routine healing: Secondary | ICD-10-CM | POA: Diagnosis not present

## 2020-03-16 DIAGNOSIS — I272 Pulmonary hypertension, unspecified: Secondary | ICD-10-CM | POA: Diagnosis not present

## 2020-03-16 DIAGNOSIS — L89321 Pressure ulcer of left buttock, stage 1: Secondary | ICD-10-CM | POA: Diagnosis not present

## 2020-03-16 DIAGNOSIS — D631 Anemia in chronic kidney disease: Secondary | ICD-10-CM | POA: Diagnosis not present

## 2020-03-16 NOTE — Telephone Encounter (Signed)
Patient notified

## 2020-03-18 DIAGNOSIS — L89311 Pressure ulcer of right buttock, stage 1: Secondary | ICD-10-CM | POA: Diagnosis not present

## 2020-03-18 DIAGNOSIS — I272 Pulmonary hypertension, unspecified: Secondary | ICD-10-CM | POA: Diagnosis not present

## 2020-03-18 DIAGNOSIS — D631 Anemia in chronic kidney disease: Secondary | ICD-10-CM | POA: Diagnosis not present

## 2020-03-18 DIAGNOSIS — N1832 Chronic kidney disease, stage 3b: Secondary | ICD-10-CM | POA: Diagnosis not present

## 2020-03-18 DIAGNOSIS — S82142D Displaced bicondylar fracture of left tibia, subsequent encounter for closed fracture with routine healing: Secondary | ICD-10-CM | POA: Diagnosis not present

## 2020-03-18 DIAGNOSIS — I129 Hypertensive chronic kidney disease with stage 1 through stage 4 chronic kidney disease, or unspecified chronic kidney disease: Secondary | ICD-10-CM | POA: Diagnosis not present

## 2020-03-18 DIAGNOSIS — I25118 Atherosclerotic heart disease of native coronary artery with other forms of angina pectoris: Secondary | ICD-10-CM | POA: Diagnosis not present

## 2020-03-18 DIAGNOSIS — S82832D Other fracture of upper and lower end of left fibula, subsequent encounter for closed fracture with routine healing: Secondary | ICD-10-CM | POA: Diagnosis not present

## 2020-03-18 DIAGNOSIS — L89321 Pressure ulcer of left buttock, stage 1: Secondary | ICD-10-CM | POA: Diagnosis not present

## 2020-03-23 DIAGNOSIS — L89311 Pressure ulcer of right buttock, stage 1: Secondary | ICD-10-CM | POA: Diagnosis not present

## 2020-03-23 DIAGNOSIS — I272 Pulmonary hypertension, unspecified: Secondary | ICD-10-CM | POA: Diagnosis not present

## 2020-03-23 DIAGNOSIS — S82832D Other fracture of upper and lower end of left fibula, subsequent encounter for closed fracture with routine healing: Secondary | ICD-10-CM | POA: Diagnosis not present

## 2020-03-23 DIAGNOSIS — I25118 Atherosclerotic heart disease of native coronary artery with other forms of angina pectoris: Secondary | ICD-10-CM | POA: Diagnosis not present

## 2020-03-23 DIAGNOSIS — L89321 Pressure ulcer of left buttock, stage 1: Secondary | ICD-10-CM | POA: Diagnosis not present

## 2020-03-23 DIAGNOSIS — N1832 Chronic kidney disease, stage 3b: Secondary | ICD-10-CM | POA: Diagnosis not present

## 2020-03-23 DIAGNOSIS — I129 Hypertensive chronic kidney disease with stage 1 through stage 4 chronic kidney disease, or unspecified chronic kidney disease: Secondary | ICD-10-CM | POA: Diagnosis not present

## 2020-03-23 DIAGNOSIS — D631 Anemia in chronic kidney disease: Secondary | ICD-10-CM | POA: Diagnosis not present

## 2020-03-23 DIAGNOSIS — S82142D Displaced bicondylar fracture of left tibia, subsequent encounter for closed fracture with routine healing: Secondary | ICD-10-CM | POA: Diagnosis not present

## 2020-03-24 DIAGNOSIS — I25118 Atherosclerotic heart disease of native coronary artery with other forms of angina pectoris: Secondary | ICD-10-CM | POA: Diagnosis not present

## 2020-03-24 DIAGNOSIS — I129 Hypertensive chronic kidney disease with stage 1 through stage 4 chronic kidney disease, or unspecified chronic kidney disease: Secondary | ICD-10-CM | POA: Diagnosis not present

## 2020-03-24 DIAGNOSIS — S82142D Displaced bicondylar fracture of left tibia, subsequent encounter for closed fracture with routine healing: Secondary | ICD-10-CM | POA: Diagnosis not present

## 2020-03-24 DIAGNOSIS — D631 Anemia in chronic kidney disease: Secondary | ICD-10-CM | POA: Diagnosis not present

## 2020-03-24 DIAGNOSIS — L89321 Pressure ulcer of left buttock, stage 1: Secondary | ICD-10-CM | POA: Diagnosis not present

## 2020-03-24 DIAGNOSIS — L89311 Pressure ulcer of right buttock, stage 1: Secondary | ICD-10-CM | POA: Diagnosis not present

## 2020-03-24 DIAGNOSIS — N1832 Chronic kidney disease, stage 3b: Secondary | ICD-10-CM | POA: Diagnosis not present

## 2020-03-24 DIAGNOSIS — I272 Pulmonary hypertension, unspecified: Secondary | ICD-10-CM | POA: Diagnosis not present

## 2020-03-24 DIAGNOSIS — S82832D Other fracture of upper and lower end of left fibula, subsequent encounter for closed fracture with routine healing: Secondary | ICD-10-CM | POA: Diagnosis not present

## 2020-03-25 DIAGNOSIS — I25118 Atherosclerotic heart disease of native coronary artery with other forms of angina pectoris: Secondary | ICD-10-CM | POA: Diagnosis not present

## 2020-03-25 DIAGNOSIS — I272 Pulmonary hypertension, unspecified: Secondary | ICD-10-CM | POA: Diagnosis not present

## 2020-03-25 DIAGNOSIS — S82142D Displaced bicondylar fracture of left tibia, subsequent encounter for closed fracture with routine healing: Secondary | ICD-10-CM | POA: Diagnosis not present

## 2020-03-25 DIAGNOSIS — D631 Anemia in chronic kidney disease: Secondary | ICD-10-CM | POA: Diagnosis not present

## 2020-03-25 DIAGNOSIS — N1832 Chronic kidney disease, stage 3b: Secondary | ICD-10-CM | POA: Diagnosis not present

## 2020-03-25 DIAGNOSIS — I129 Hypertensive chronic kidney disease with stage 1 through stage 4 chronic kidney disease, or unspecified chronic kidney disease: Secondary | ICD-10-CM | POA: Diagnosis not present

## 2020-03-25 DIAGNOSIS — L89311 Pressure ulcer of right buttock, stage 1: Secondary | ICD-10-CM | POA: Diagnosis not present

## 2020-03-25 DIAGNOSIS — L89321 Pressure ulcer of left buttock, stage 1: Secondary | ICD-10-CM | POA: Diagnosis not present

## 2020-03-25 DIAGNOSIS — S82832D Other fracture of upper and lower end of left fibula, subsequent encounter for closed fracture with routine healing: Secondary | ICD-10-CM | POA: Diagnosis not present

## 2020-03-28 DIAGNOSIS — S82832D Other fracture of upper and lower end of left fibula, subsequent encounter for closed fracture with routine healing: Secondary | ICD-10-CM | POA: Diagnosis not present

## 2020-03-28 DIAGNOSIS — N1832 Chronic kidney disease, stage 3b: Secondary | ICD-10-CM | POA: Diagnosis not present

## 2020-03-28 DIAGNOSIS — S82142D Displaced bicondylar fracture of left tibia, subsequent encounter for closed fracture with routine healing: Secondary | ICD-10-CM | POA: Diagnosis not present

## 2020-03-28 DIAGNOSIS — I129 Hypertensive chronic kidney disease with stage 1 through stage 4 chronic kidney disease, or unspecified chronic kidney disease: Secondary | ICD-10-CM | POA: Diagnosis not present

## 2020-03-28 DIAGNOSIS — L89311 Pressure ulcer of right buttock, stage 1: Secondary | ICD-10-CM | POA: Diagnosis not present

## 2020-03-28 DIAGNOSIS — I25118 Atherosclerotic heart disease of native coronary artery with other forms of angina pectoris: Secondary | ICD-10-CM | POA: Diagnosis not present

## 2020-03-28 DIAGNOSIS — I272 Pulmonary hypertension, unspecified: Secondary | ICD-10-CM | POA: Diagnosis not present

## 2020-03-28 DIAGNOSIS — L89321 Pressure ulcer of left buttock, stage 1: Secondary | ICD-10-CM | POA: Diagnosis not present

## 2020-03-28 DIAGNOSIS — D631 Anemia in chronic kidney disease: Secondary | ICD-10-CM | POA: Diagnosis not present

## 2020-03-29 DIAGNOSIS — L89311 Pressure ulcer of right buttock, stage 1: Secondary | ICD-10-CM | POA: Diagnosis not present

## 2020-03-29 DIAGNOSIS — L89321 Pressure ulcer of left buttock, stage 1: Secondary | ICD-10-CM | POA: Diagnosis not present

## 2020-03-29 DIAGNOSIS — I272 Pulmonary hypertension, unspecified: Secondary | ICD-10-CM | POA: Diagnosis not present

## 2020-03-29 DIAGNOSIS — I129 Hypertensive chronic kidney disease with stage 1 through stage 4 chronic kidney disease, or unspecified chronic kidney disease: Secondary | ICD-10-CM | POA: Diagnosis not present

## 2020-03-29 DIAGNOSIS — S82832D Other fracture of upper and lower end of left fibula, subsequent encounter for closed fracture with routine healing: Secondary | ICD-10-CM | POA: Diagnosis not present

## 2020-03-29 DIAGNOSIS — D631 Anemia in chronic kidney disease: Secondary | ICD-10-CM | POA: Diagnosis not present

## 2020-03-29 DIAGNOSIS — N1832 Chronic kidney disease, stage 3b: Secondary | ICD-10-CM | POA: Diagnosis not present

## 2020-03-29 DIAGNOSIS — S82142D Displaced bicondylar fracture of left tibia, subsequent encounter for closed fracture with routine healing: Secondary | ICD-10-CM | POA: Diagnosis not present

## 2020-03-29 DIAGNOSIS — I25118 Atherosclerotic heart disease of native coronary artery with other forms of angina pectoris: Secondary | ICD-10-CM | POA: Diagnosis not present

## 2020-03-31 DIAGNOSIS — L89311 Pressure ulcer of right buttock, stage 1: Secondary | ICD-10-CM | POA: Diagnosis not present

## 2020-03-31 DIAGNOSIS — L89321 Pressure ulcer of left buttock, stage 1: Secondary | ICD-10-CM | POA: Diagnosis not present

## 2020-03-31 DIAGNOSIS — N1832 Chronic kidney disease, stage 3b: Secondary | ICD-10-CM | POA: Diagnosis not present

## 2020-03-31 DIAGNOSIS — I129 Hypertensive chronic kidney disease with stage 1 through stage 4 chronic kidney disease, or unspecified chronic kidney disease: Secondary | ICD-10-CM | POA: Diagnosis not present

## 2020-03-31 DIAGNOSIS — I25118 Atherosclerotic heart disease of native coronary artery with other forms of angina pectoris: Secondary | ICD-10-CM | POA: Diagnosis not present

## 2020-03-31 DIAGNOSIS — S82832D Other fracture of upper and lower end of left fibula, subsequent encounter for closed fracture with routine healing: Secondary | ICD-10-CM | POA: Diagnosis not present

## 2020-03-31 DIAGNOSIS — D631 Anemia in chronic kidney disease: Secondary | ICD-10-CM | POA: Diagnosis not present

## 2020-03-31 DIAGNOSIS — I272 Pulmonary hypertension, unspecified: Secondary | ICD-10-CM | POA: Diagnosis not present

## 2020-03-31 DIAGNOSIS — S82142D Displaced bicondylar fracture of left tibia, subsequent encounter for closed fracture with routine healing: Secondary | ICD-10-CM | POA: Diagnosis not present

## 2020-04-03 DIAGNOSIS — S82141A Displaced bicondylar fracture of right tibia, initial encounter for closed fracture: Secondary | ICD-10-CM | POA: Diagnosis not present

## 2020-04-03 DIAGNOSIS — M25462 Effusion, left knee: Secondary | ICD-10-CM | POA: Diagnosis not present

## 2020-04-04 ENCOUNTER — Ambulatory Visit (INDEPENDENT_AMBULATORY_CARE_PROVIDER_SITE_OTHER): Payer: Medicare HMO

## 2020-04-04 ENCOUNTER — Other Ambulatory Visit: Payer: Self-pay

## 2020-04-04 DIAGNOSIS — I272 Pulmonary hypertension, unspecified: Secondary | ICD-10-CM

## 2020-04-04 DIAGNOSIS — I25118 Atherosclerotic heart disease of native coronary artery with other forms of angina pectoris: Secondary | ICD-10-CM | POA: Diagnosis not present

## 2020-04-04 DIAGNOSIS — S82832D Other fracture of upper and lower end of left fibula, subsequent encounter for closed fracture with routine healing: Secondary | ICD-10-CM

## 2020-04-04 DIAGNOSIS — K5903 Drug induced constipation: Secondary | ICD-10-CM

## 2020-04-04 DIAGNOSIS — S82142D Displaced bicondylar fracture of left tibia, subsequent encounter for closed fracture with routine healing: Secondary | ICD-10-CM

## 2020-04-04 DIAGNOSIS — R32 Unspecified urinary incontinence: Secondary | ICD-10-CM

## 2020-04-04 DIAGNOSIS — D631 Anemia in chronic kidney disease: Secondary | ICD-10-CM

## 2020-04-04 DIAGNOSIS — L89311 Pressure ulcer of right buttock, stage 1: Secondary | ICD-10-CM

## 2020-04-04 DIAGNOSIS — B0229 Other postherpetic nervous system involvement: Secondary | ICD-10-CM

## 2020-04-04 DIAGNOSIS — I252 Old myocardial infarction: Secondary | ICD-10-CM

## 2020-04-04 DIAGNOSIS — N1832 Chronic kidney disease, stage 3b: Secondary | ICD-10-CM

## 2020-04-04 DIAGNOSIS — K589 Irritable bowel syndrome without diarrhea: Secondary | ICD-10-CM

## 2020-04-04 DIAGNOSIS — T402X5D Adverse effect of other opioids, subsequent encounter: Secondary | ICD-10-CM

## 2020-04-04 DIAGNOSIS — K219 Gastro-esophageal reflux disease without esophagitis: Secondary | ICD-10-CM

## 2020-04-04 DIAGNOSIS — Z7901 Long term (current) use of anticoagulants: Secondary | ICD-10-CM

## 2020-04-04 DIAGNOSIS — J45909 Unspecified asthma, uncomplicated: Secondary | ICD-10-CM

## 2020-04-04 DIAGNOSIS — Z87891 Personal history of nicotine dependence: Secondary | ICD-10-CM

## 2020-04-04 DIAGNOSIS — I129 Hypertensive chronic kidney disease with stage 1 through stage 4 chronic kidney disease, or unspecified chronic kidney disease: Secondary | ICD-10-CM

## 2020-04-04 DIAGNOSIS — H9193 Unspecified hearing loss, bilateral: Secondary | ICD-10-CM

## 2020-04-04 DIAGNOSIS — Z8673 Personal history of transient ischemic attack (TIA), and cerebral infarction without residual deficits: Secondary | ICD-10-CM

## 2020-04-04 DIAGNOSIS — M6281 Muscle weakness (generalized): Secondary | ICD-10-CM

## 2020-04-04 DIAGNOSIS — G8929 Other chronic pain: Secondary | ICD-10-CM

## 2020-04-04 DIAGNOSIS — L89321 Pressure ulcer of left buttock, stage 1: Secondary | ICD-10-CM

## 2020-04-04 DIAGNOSIS — I4811 Longstanding persistent atrial fibrillation: Secondary | ICD-10-CM

## 2020-04-04 DIAGNOSIS — Z9181 History of falling: Secondary | ICD-10-CM

## 2020-04-06 DIAGNOSIS — L89311 Pressure ulcer of right buttock, stage 1: Secondary | ICD-10-CM | POA: Diagnosis not present

## 2020-04-06 DIAGNOSIS — N1832 Chronic kidney disease, stage 3b: Secondary | ICD-10-CM | POA: Diagnosis not present

## 2020-04-06 DIAGNOSIS — I25118 Atherosclerotic heart disease of native coronary artery with other forms of angina pectoris: Secondary | ICD-10-CM | POA: Diagnosis not present

## 2020-04-06 DIAGNOSIS — S82832D Other fracture of upper and lower end of left fibula, subsequent encounter for closed fracture with routine healing: Secondary | ICD-10-CM | POA: Diagnosis not present

## 2020-04-06 DIAGNOSIS — I272 Pulmonary hypertension, unspecified: Secondary | ICD-10-CM | POA: Diagnosis not present

## 2020-04-06 DIAGNOSIS — D631 Anemia in chronic kidney disease: Secondary | ICD-10-CM | POA: Diagnosis not present

## 2020-04-06 DIAGNOSIS — I129 Hypertensive chronic kidney disease with stage 1 through stage 4 chronic kidney disease, or unspecified chronic kidney disease: Secondary | ICD-10-CM | POA: Diagnosis not present

## 2020-04-06 DIAGNOSIS — S82142D Displaced bicondylar fracture of left tibia, subsequent encounter for closed fracture with routine healing: Secondary | ICD-10-CM | POA: Diagnosis not present

## 2020-04-06 DIAGNOSIS — L89321 Pressure ulcer of left buttock, stage 1: Secondary | ICD-10-CM | POA: Diagnosis not present

## 2020-04-11 ENCOUNTER — Other Ambulatory Visit: Payer: Self-pay | Admitting: Family Medicine

## 2020-04-12 DIAGNOSIS — I272 Pulmonary hypertension, unspecified: Secondary | ICD-10-CM | POA: Diagnosis not present

## 2020-04-12 DIAGNOSIS — L89311 Pressure ulcer of right buttock, stage 1: Secondary | ICD-10-CM | POA: Diagnosis not present

## 2020-04-12 DIAGNOSIS — D631 Anemia in chronic kidney disease: Secondary | ICD-10-CM | POA: Diagnosis not present

## 2020-04-12 DIAGNOSIS — I129 Hypertensive chronic kidney disease with stage 1 through stage 4 chronic kidney disease, or unspecified chronic kidney disease: Secondary | ICD-10-CM | POA: Diagnosis not present

## 2020-04-12 DIAGNOSIS — I25118 Atherosclerotic heart disease of native coronary artery with other forms of angina pectoris: Secondary | ICD-10-CM | POA: Diagnosis not present

## 2020-04-12 DIAGNOSIS — S82832D Other fracture of upper and lower end of left fibula, subsequent encounter for closed fracture with routine healing: Secondary | ICD-10-CM | POA: Diagnosis not present

## 2020-04-12 DIAGNOSIS — N1832 Chronic kidney disease, stage 3b: Secondary | ICD-10-CM | POA: Diagnosis not present

## 2020-04-12 DIAGNOSIS — S82142D Displaced bicondylar fracture of left tibia, subsequent encounter for closed fracture with routine healing: Secondary | ICD-10-CM | POA: Diagnosis not present

## 2020-04-12 DIAGNOSIS — L89321 Pressure ulcer of left buttock, stage 1: Secondary | ICD-10-CM | POA: Diagnosis not present

## 2020-04-13 DIAGNOSIS — N1832 Chronic kidney disease, stage 3b: Secondary | ICD-10-CM | POA: Diagnosis not present

## 2020-04-13 DIAGNOSIS — I25118 Atherosclerotic heart disease of native coronary artery with other forms of angina pectoris: Secondary | ICD-10-CM | POA: Diagnosis not present

## 2020-04-13 DIAGNOSIS — S82832D Other fracture of upper and lower end of left fibula, subsequent encounter for closed fracture with routine healing: Secondary | ICD-10-CM | POA: Diagnosis not present

## 2020-04-13 DIAGNOSIS — S82142D Displaced bicondylar fracture of left tibia, subsequent encounter for closed fracture with routine healing: Secondary | ICD-10-CM | POA: Diagnosis not present

## 2020-04-13 DIAGNOSIS — D631 Anemia in chronic kidney disease: Secondary | ICD-10-CM | POA: Diagnosis not present

## 2020-04-13 DIAGNOSIS — L89321 Pressure ulcer of left buttock, stage 1: Secondary | ICD-10-CM | POA: Diagnosis not present

## 2020-04-13 DIAGNOSIS — I272 Pulmonary hypertension, unspecified: Secondary | ICD-10-CM | POA: Diagnosis not present

## 2020-04-13 DIAGNOSIS — L89311 Pressure ulcer of right buttock, stage 1: Secondary | ICD-10-CM | POA: Diagnosis not present

## 2020-04-13 DIAGNOSIS — I129 Hypertensive chronic kidney disease with stage 1 through stage 4 chronic kidney disease, or unspecified chronic kidney disease: Secondary | ICD-10-CM | POA: Diagnosis not present

## 2020-04-20 DIAGNOSIS — D631 Anemia in chronic kidney disease: Secondary | ICD-10-CM | POA: Diagnosis not present

## 2020-04-20 DIAGNOSIS — I129 Hypertensive chronic kidney disease with stage 1 through stage 4 chronic kidney disease, or unspecified chronic kidney disease: Secondary | ICD-10-CM | POA: Diagnosis not present

## 2020-04-20 DIAGNOSIS — L89321 Pressure ulcer of left buttock, stage 1: Secondary | ICD-10-CM | POA: Diagnosis not present

## 2020-04-20 DIAGNOSIS — I25118 Atherosclerotic heart disease of native coronary artery with other forms of angina pectoris: Secondary | ICD-10-CM | POA: Diagnosis not present

## 2020-04-20 DIAGNOSIS — S82832D Other fracture of upper and lower end of left fibula, subsequent encounter for closed fracture with routine healing: Secondary | ICD-10-CM | POA: Diagnosis not present

## 2020-04-20 DIAGNOSIS — S82142D Displaced bicondylar fracture of left tibia, subsequent encounter for closed fracture with routine healing: Secondary | ICD-10-CM | POA: Diagnosis not present

## 2020-04-20 DIAGNOSIS — I272 Pulmonary hypertension, unspecified: Secondary | ICD-10-CM | POA: Diagnosis not present

## 2020-04-20 DIAGNOSIS — L89311 Pressure ulcer of right buttock, stage 1: Secondary | ICD-10-CM | POA: Diagnosis not present

## 2020-04-20 DIAGNOSIS — N1832 Chronic kidney disease, stage 3b: Secondary | ICD-10-CM | POA: Diagnosis not present

## 2020-04-21 DIAGNOSIS — S82142D Displaced bicondylar fracture of left tibia, subsequent encounter for closed fracture with routine healing: Secondary | ICD-10-CM | POA: Diagnosis not present

## 2020-04-21 DIAGNOSIS — I25118 Atherosclerotic heart disease of native coronary artery with other forms of angina pectoris: Secondary | ICD-10-CM | POA: Diagnosis not present

## 2020-04-21 DIAGNOSIS — S82832D Other fracture of upper and lower end of left fibula, subsequent encounter for closed fracture with routine healing: Secondary | ICD-10-CM | POA: Diagnosis not present

## 2020-04-21 DIAGNOSIS — N1832 Chronic kidney disease, stage 3b: Secondary | ICD-10-CM | POA: Diagnosis not present

## 2020-04-21 DIAGNOSIS — I272 Pulmonary hypertension, unspecified: Secondary | ICD-10-CM | POA: Diagnosis not present

## 2020-04-21 DIAGNOSIS — D631 Anemia in chronic kidney disease: Secondary | ICD-10-CM | POA: Diagnosis not present

## 2020-04-21 DIAGNOSIS — L89311 Pressure ulcer of right buttock, stage 1: Secondary | ICD-10-CM | POA: Diagnosis not present

## 2020-04-21 DIAGNOSIS — L89321 Pressure ulcer of left buttock, stage 1: Secondary | ICD-10-CM | POA: Diagnosis not present

## 2020-04-21 DIAGNOSIS — I129 Hypertensive chronic kidney disease with stage 1 through stage 4 chronic kidney disease, or unspecified chronic kidney disease: Secondary | ICD-10-CM | POA: Diagnosis not present

## 2020-04-26 DIAGNOSIS — S82142D Displaced bicondylar fracture of left tibia, subsequent encounter for closed fracture with routine healing: Secondary | ICD-10-CM | POA: Diagnosis not present

## 2020-04-26 DIAGNOSIS — L89321 Pressure ulcer of left buttock, stage 1: Secondary | ICD-10-CM | POA: Diagnosis not present

## 2020-04-26 DIAGNOSIS — I272 Pulmonary hypertension, unspecified: Secondary | ICD-10-CM | POA: Diagnosis not present

## 2020-04-26 DIAGNOSIS — N1832 Chronic kidney disease, stage 3b: Secondary | ICD-10-CM | POA: Diagnosis not present

## 2020-04-26 DIAGNOSIS — I129 Hypertensive chronic kidney disease with stage 1 through stage 4 chronic kidney disease, or unspecified chronic kidney disease: Secondary | ICD-10-CM | POA: Diagnosis not present

## 2020-04-26 DIAGNOSIS — L89311 Pressure ulcer of right buttock, stage 1: Secondary | ICD-10-CM | POA: Diagnosis not present

## 2020-04-26 DIAGNOSIS — I25118 Atherosclerotic heart disease of native coronary artery with other forms of angina pectoris: Secondary | ICD-10-CM | POA: Diagnosis not present

## 2020-04-26 DIAGNOSIS — D631 Anemia in chronic kidney disease: Secondary | ICD-10-CM | POA: Diagnosis not present

## 2020-04-26 DIAGNOSIS — S82832D Other fracture of upper and lower end of left fibula, subsequent encounter for closed fracture with routine healing: Secondary | ICD-10-CM | POA: Diagnosis not present

## 2020-04-27 DIAGNOSIS — D631 Anemia in chronic kidney disease: Secondary | ICD-10-CM | POA: Diagnosis not present

## 2020-04-27 DIAGNOSIS — I129 Hypertensive chronic kidney disease with stage 1 through stage 4 chronic kidney disease, or unspecified chronic kidney disease: Secondary | ICD-10-CM | POA: Diagnosis not present

## 2020-04-27 DIAGNOSIS — I25118 Atherosclerotic heart disease of native coronary artery with other forms of angina pectoris: Secondary | ICD-10-CM | POA: Diagnosis not present

## 2020-04-27 DIAGNOSIS — I272 Pulmonary hypertension, unspecified: Secondary | ICD-10-CM | POA: Diagnosis not present

## 2020-04-27 DIAGNOSIS — N1832 Chronic kidney disease, stage 3b: Secondary | ICD-10-CM | POA: Diagnosis not present

## 2020-04-27 DIAGNOSIS — S82832D Other fracture of upper and lower end of left fibula, subsequent encounter for closed fracture with routine healing: Secondary | ICD-10-CM | POA: Diagnosis not present

## 2020-04-27 DIAGNOSIS — L89321 Pressure ulcer of left buttock, stage 1: Secondary | ICD-10-CM | POA: Diagnosis not present

## 2020-04-27 DIAGNOSIS — S82142D Displaced bicondylar fracture of left tibia, subsequent encounter for closed fracture with routine healing: Secondary | ICD-10-CM | POA: Diagnosis not present

## 2020-04-27 DIAGNOSIS — L89311 Pressure ulcer of right buttock, stage 1: Secondary | ICD-10-CM | POA: Diagnosis not present

## 2020-04-30 DIAGNOSIS — S82142D Displaced bicondylar fracture of left tibia, subsequent encounter for closed fracture with routine healing: Secondary | ICD-10-CM | POA: Diagnosis not present

## 2020-04-30 DIAGNOSIS — L89311 Pressure ulcer of right buttock, stage 1: Secondary | ICD-10-CM | POA: Diagnosis not present

## 2020-04-30 DIAGNOSIS — I272 Pulmonary hypertension, unspecified: Secondary | ICD-10-CM | POA: Diagnosis not present

## 2020-04-30 DIAGNOSIS — L89321 Pressure ulcer of left buttock, stage 1: Secondary | ICD-10-CM | POA: Diagnosis not present

## 2020-04-30 DIAGNOSIS — S82832D Other fracture of upper and lower end of left fibula, subsequent encounter for closed fracture with routine healing: Secondary | ICD-10-CM | POA: Diagnosis not present

## 2020-04-30 DIAGNOSIS — I25118 Atherosclerotic heart disease of native coronary artery with other forms of angina pectoris: Secondary | ICD-10-CM | POA: Diagnosis not present

## 2020-04-30 DIAGNOSIS — D631 Anemia in chronic kidney disease: Secondary | ICD-10-CM | POA: Diagnosis not present

## 2020-04-30 DIAGNOSIS — I129 Hypertensive chronic kidney disease with stage 1 through stage 4 chronic kidney disease, or unspecified chronic kidney disease: Secondary | ICD-10-CM | POA: Diagnosis not present

## 2020-04-30 DIAGNOSIS — N1832 Chronic kidney disease, stage 3b: Secondary | ICD-10-CM | POA: Diagnosis not present

## 2020-05-01 ENCOUNTER — Other Ambulatory Visit: Payer: Self-pay | Admitting: Family Medicine

## 2020-05-04 DIAGNOSIS — S82142D Displaced bicondylar fracture of left tibia, subsequent encounter for closed fracture with routine healing: Secondary | ICD-10-CM | POA: Diagnosis not present

## 2020-05-04 DIAGNOSIS — L89311 Pressure ulcer of right buttock, stage 1: Secondary | ICD-10-CM | POA: Diagnosis not present

## 2020-05-04 DIAGNOSIS — S82141A Displaced bicondylar fracture of right tibia, initial encounter for closed fracture: Secondary | ICD-10-CM | POA: Diagnosis not present

## 2020-05-04 DIAGNOSIS — I272 Pulmonary hypertension, unspecified: Secondary | ICD-10-CM | POA: Diagnosis not present

## 2020-05-04 DIAGNOSIS — D631 Anemia in chronic kidney disease: Secondary | ICD-10-CM | POA: Diagnosis not present

## 2020-05-04 DIAGNOSIS — I25118 Atherosclerotic heart disease of native coronary artery with other forms of angina pectoris: Secondary | ICD-10-CM | POA: Diagnosis not present

## 2020-05-04 DIAGNOSIS — S82832D Other fracture of upper and lower end of left fibula, subsequent encounter for closed fracture with routine healing: Secondary | ICD-10-CM | POA: Diagnosis not present

## 2020-05-04 DIAGNOSIS — L89321 Pressure ulcer of left buttock, stage 1: Secondary | ICD-10-CM | POA: Diagnosis not present

## 2020-05-04 DIAGNOSIS — M25462 Effusion, left knee: Secondary | ICD-10-CM | POA: Diagnosis not present

## 2020-05-04 DIAGNOSIS — I129 Hypertensive chronic kidney disease with stage 1 through stage 4 chronic kidney disease, or unspecified chronic kidney disease: Secondary | ICD-10-CM | POA: Diagnosis not present

## 2020-05-04 DIAGNOSIS — N1832 Chronic kidney disease, stage 3b: Secondary | ICD-10-CM | POA: Diagnosis not present

## 2020-05-05 DIAGNOSIS — I25118 Atherosclerotic heart disease of native coronary artery with other forms of angina pectoris: Secondary | ICD-10-CM | POA: Diagnosis not present

## 2020-05-05 DIAGNOSIS — L89321 Pressure ulcer of left buttock, stage 1: Secondary | ICD-10-CM | POA: Diagnosis not present

## 2020-05-05 DIAGNOSIS — D631 Anemia in chronic kidney disease: Secondary | ICD-10-CM | POA: Diagnosis not present

## 2020-05-05 DIAGNOSIS — L89311 Pressure ulcer of right buttock, stage 1: Secondary | ICD-10-CM | POA: Diagnosis not present

## 2020-05-05 DIAGNOSIS — I272 Pulmonary hypertension, unspecified: Secondary | ICD-10-CM | POA: Diagnosis not present

## 2020-05-05 DIAGNOSIS — S82142D Displaced bicondylar fracture of left tibia, subsequent encounter for closed fracture with routine healing: Secondary | ICD-10-CM | POA: Diagnosis not present

## 2020-05-05 DIAGNOSIS — S82832D Other fracture of upper and lower end of left fibula, subsequent encounter for closed fracture with routine healing: Secondary | ICD-10-CM | POA: Diagnosis not present

## 2020-05-05 DIAGNOSIS — N1832 Chronic kidney disease, stage 3b: Secondary | ICD-10-CM | POA: Diagnosis not present

## 2020-05-05 DIAGNOSIS — I129 Hypertensive chronic kidney disease with stage 1 through stage 4 chronic kidney disease, or unspecified chronic kidney disease: Secondary | ICD-10-CM | POA: Diagnosis not present

## 2020-05-06 ENCOUNTER — Other Ambulatory Visit: Payer: Self-pay | Admitting: Family Medicine

## 2020-05-06 ENCOUNTER — Other Ambulatory Visit: Payer: Self-pay

## 2020-05-13 DIAGNOSIS — L89311 Pressure ulcer of right buttock, stage 1: Secondary | ICD-10-CM | POA: Diagnosis not present

## 2020-05-13 DIAGNOSIS — N1832 Chronic kidney disease, stage 3b: Secondary | ICD-10-CM | POA: Diagnosis not present

## 2020-05-13 DIAGNOSIS — I272 Pulmonary hypertension, unspecified: Secondary | ICD-10-CM | POA: Diagnosis not present

## 2020-05-13 DIAGNOSIS — S82142D Displaced bicondylar fracture of left tibia, subsequent encounter for closed fracture with routine healing: Secondary | ICD-10-CM | POA: Diagnosis not present

## 2020-05-13 DIAGNOSIS — I129 Hypertensive chronic kidney disease with stage 1 through stage 4 chronic kidney disease, or unspecified chronic kidney disease: Secondary | ICD-10-CM | POA: Diagnosis not present

## 2020-05-13 DIAGNOSIS — D631 Anemia in chronic kidney disease: Secondary | ICD-10-CM | POA: Diagnosis not present

## 2020-05-13 DIAGNOSIS — L89321 Pressure ulcer of left buttock, stage 1: Secondary | ICD-10-CM | POA: Diagnosis not present

## 2020-05-13 DIAGNOSIS — I25118 Atherosclerotic heart disease of native coronary artery with other forms of angina pectoris: Secondary | ICD-10-CM | POA: Diagnosis not present

## 2020-05-13 DIAGNOSIS — S82832D Other fracture of upper and lower end of left fibula, subsequent encounter for closed fracture with routine healing: Secondary | ICD-10-CM | POA: Diagnosis not present

## 2020-05-14 ENCOUNTER — Other Ambulatory Visit: Payer: Self-pay | Admitting: Family Medicine

## 2020-05-19 DIAGNOSIS — I272 Pulmonary hypertension, unspecified: Secondary | ICD-10-CM | POA: Diagnosis not present

## 2020-05-19 DIAGNOSIS — L89321 Pressure ulcer of left buttock, stage 1: Secondary | ICD-10-CM | POA: Diagnosis not present

## 2020-05-19 DIAGNOSIS — N1832 Chronic kidney disease, stage 3b: Secondary | ICD-10-CM | POA: Diagnosis not present

## 2020-05-19 DIAGNOSIS — I129 Hypertensive chronic kidney disease with stage 1 through stage 4 chronic kidney disease, or unspecified chronic kidney disease: Secondary | ICD-10-CM | POA: Diagnosis not present

## 2020-05-19 DIAGNOSIS — S82142D Displaced bicondylar fracture of left tibia, subsequent encounter for closed fracture with routine healing: Secondary | ICD-10-CM | POA: Diagnosis not present

## 2020-05-19 DIAGNOSIS — I25118 Atherosclerotic heart disease of native coronary artery with other forms of angina pectoris: Secondary | ICD-10-CM | POA: Diagnosis not present

## 2020-05-19 DIAGNOSIS — S82832D Other fracture of upper and lower end of left fibula, subsequent encounter for closed fracture with routine healing: Secondary | ICD-10-CM | POA: Diagnosis not present

## 2020-05-19 DIAGNOSIS — D631 Anemia in chronic kidney disease: Secondary | ICD-10-CM | POA: Diagnosis not present

## 2020-05-19 DIAGNOSIS — L89311 Pressure ulcer of right buttock, stage 1: Secondary | ICD-10-CM | POA: Diagnosis not present

## 2020-05-23 ENCOUNTER — Other Ambulatory Visit: Payer: Self-pay

## 2020-05-23 ENCOUNTER — Encounter: Payer: Self-pay | Admitting: Family Medicine

## 2020-05-23 ENCOUNTER — Ambulatory Visit (INDEPENDENT_AMBULATORY_CARE_PROVIDER_SITE_OTHER): Payer: Medicare HMO | Admitting: Family Medicine

## 2020-05-23 VITALS — BP 108/60 | HR 77 | Temp 97.5°F | Ht 61.0 in | Wt 123.2 lb

## 2020-05-23 DIAGNOSIS — Z79891 Long term (current) use of opiate analgesic: Secondary | ICD-10-CM | POA: Diagnosis not present

## 2020-05-23 DIAGNOSIS — I5020 Unspecified systolic (congestive) heart failure: Secondary | ICD-10-CM | POA: Diagnosis not present

## 2020-05-23 DIAGNOSIS — F1120 Opioid dependence, uncomplicated: Secondary | ICD-10-CM

## 2020-05-23 DIAGNOSIS — D500 Iron deficiency anemia secondary to blood loss (chronic): Secondary | ICD-10-CM | POA: Diagnosis not present

## 2020-05-23 DIAGNOSIS — I480 Paroxysmal atrial fibrillation: Secondary | ICD-10-CM

## 2020-05-23 DIAGNOSIS — B0229 Other postherpetic nervous system involvement: Secondary | ICD-10-CM

## 2020-05-23 DIAGNOSIS — Z0289 Encounter for other administrative examinations: Secondary | ICD-10-CM | POA: Diagnosis not present

## 2020-05-23 MED ORDER — IRON (FERROUS SULFATE) 325 (65 FE) MG PO TABS: 325.0000 mg | ORAL_TABLET | Freq: Every day | ORAL | 5 refills | Status: DC

## 2020-05-23 MED ORDER — METHADONE HCL 10 MG PO TABS: 10.0000 mg | ORAL_TABLET | Freq: Three times a day (TID) | ORAL | 0 refills | Status: DC

## 2020-05-23 MED ORDER — XARELTO 10 MG PO TABS: 10.0000 mg | ORAL_TABLET | Freq: Every day | ORAL | 3 refills | Status: AC

## 2020-05-23 MED ORDER — BETAMETHASONE SOD PHOS & ACET 6 (3-3) MG/ML IJ SUSP
6.0000 mg | Freq: Once | INTRAMUSCULAR | Status: AC
  Administered 2020-05-23: 6 mg via INTRAMUSCULAR

## 2020-05-23 MED ORDER — PREDNISONE 10 MG PO TABS: ORAL_TABLET | ORAL | 0 refills | Status: DC

## 2020-05-23 MED ORDER — GABAPENTIN 800 MG PO TABS: ORAL_TABLET | ORAL | 3 refills | Status: AC

## 2020-05-23 NOTE — Progress Notes (Signed)
Subjective:  Patient ID: Sheena Morrow, female    DOB: 07/14/1933  Age: 87 y.o. MRN: [id number]  CC: Medical Management of Chronic Issues   HPI Sheena Morrow presents for follow-up on her chronic pain.  She continues to take methadone 3 times a day.  She suffers from postherpetic neuralgia of her back.  It is excruciating when she does not have medication to take.  However pain level is 1-3/10 when she takes the methadone.  She has been taking it for well over a decade without complications.  PDMP shows no discrepancies in her filling history.  She has over the last couple of months filled the medication slightly less frequently than every 30 days.  Morphine milligram equivalent is 90/day.  This is been unchanged for well over a decade.   Patient in for follow-up of atrial fibrillation. Patient denies any recent bouts of chest pain or palpitations. Additionally, patient is taking anticoagulants. Patient denies any recent excessive bleeding episodes including epistaxis, bleeding from the gums, genitalia, rectal bleeding or hematuria. Additionally there has been no excessive bruising.  Patient denies excessive shortness of breath or edema at this time.  She does have a history of congestive heart failure complicated by the atrial fibrillation at times.  Depression screen [facility] 2/9 05/23/2020 11/05/2019 08/12/2019  Decreased Interest 0 0 0  Down, Depressed, Hopeless 0 0 0  PHQ - 2 Score 0 0 0    History Sheena Morrow has a past medical history of Clotting disorder ([place]), Constipation due to opioid therapy, Old MI (myocardial infarction) (07/21/2015), Osteoporosis, Postherpetic neuralgia, and Systolic CHF with reduced left ventricular function, NYHA class 2 ([place]) (05/21/2015).   She has a past surgical history that includes Hip surgery.   Her family history is not on file.She reports that she quit smoking about 12 years ago. Her smoking use included cigarettes. She started smoking about 51 years ago. She  smoked 0.50 packs per day. She has never used smokeless tobacco. She reports that she does not drink alcohol and does not use drugs.    ROS Review of Systems  Constitutional: Negative.   HENT: Negative.   Eyes: Negative for visual disturbance.  Respiratory: Positive for shortness of breath.   Cardiovascular: Negative for chest pain.  Gastrointestinal: Negative for abdominal pain.  Musculoskeletal: Negative for arthralgias.    Objective:  BP 108/60   Pulse 77   Temp (!) 97.5 F (36.4 C)   Ht 5\' 1"  (1.549 m)   Wt 123 lb 3.2 oz (55.9 kg)   SpO2 98%   BMI 23.28 kg/m   BP Readings from Last 3 Encounters:  05/23/20 108/60  11/05/19 130/67  08/12/19 98/60    Wt Readings from Last 3 Encounters:  05/23/20 123 lb 3.2 oz (55.9 kg)  11/05/19 131 lb (59.4 kg)  08/12/19 128 lb (58.1 kg)     Physical Exam Constitutional:      General: She is not in acute distress.    Appearance: She is well-developed.  Cardiovascular:     Rate and Rhythm: Normal rate and regular rhythm.  Pulmonary:     Breath sounds: Normal breath sounds.  Skin:    General: Skin is warm and dry.  Neurological:     Mental Status: She is alert and oriented to person, place, and time.       Assessment & Plan:   Sheena Morrow was seen today for medical management of chronic issues.  Diagnoses and all orders for this visit:  Postherpetic neuralgia -  methadone (DOLOPHINE) 10 MG tablet; Take 1 tablet (10 mg total) by mouth 3 (three) times daily. -     methadone (DOLOPHINE) 10 MG tablet; Take 1 tablet (10 mg total) by mouth 3 (three) times daily. -     betamethasone acetate-betamethasone sodium phosphate (CELESTONE) injection 6 mg  Medication management contract signed -     ToxASSURE Select 13 (MW), Urine  Systolic CHF with reduced left ventricular function, NYHA class 2 ([place])  Uncomplicated opioid dependence (HCC)  Paroxysmal atrial fibrillation (HCC)  Iron deficiency anemia due to chronic blood  loss -     CBC with Differential/Platelet  Other orders -     methadone (DOLOPHINE) 10 MG tablet; Take 1 tablet (10 mg total) by mouth 3 (three) times daily. -     Iron, Ferrous Sulfate, 325 (65 Fe) MG TABS; Take 325 mg by mouth daily. -     XARELTO 10 MG TABS tablet; Take 1 tablet (10 mg total) by mouth daily. -     predniSONE (DELTASONE) 10 MG tablet; Take 5 daily for 2 days followed by 4,3,2 and 1 for 2 days each. -     gabapentin (NEURONTIN) 800 MG tablet; One in the Morning, one in the afternoon and two at night       I have discontinued Sheena Morrow Ferrocite. I have also changed her Xarelto and gabapentin. Additionally, I am having her start on Iron (Ferrous Sulfate) and predniSONE. Lastly, I am having her maintain her nitroGLYCERIN, polyethylene glycol powder, esomeprazole, Restasis, aspirin, multivitamin, Biotin, montelukast, furosemide, Linzess, atorvastatin, RABEprazole, diltiazem, methadone, methadone, and methadone. We administered betamethasone acetate-betamethasone sodium phosphate.  Allergies as of 05/23/2020      Reactions   Nsaids Other (See Comments)   GI Bleed   Dexilant [dexlansoprazole] Diarrhea   Pantoprazole       Medication List       Accurate as of May 23, 2020 10:05 PM. If you have any questions, ask your nurse or doctor.        STOP taking these medications   Ferrocite 324 (106 Fe) MG Tabs tablet Generic drug: Ferrous Fumarate Stopped by: Claretta Fraise, MD     TAKE these medications   aspirin 81 MG EC tablet Take 1 tablet (81 mg total) by mouth daily.   atorvastatin 20 MG tablet Commonly known as: LIPITOR TAKE 1 TABLET BY MOUTH EVERY DAY   Biotin 10000 MCG Tabs Take by mouth.   diltiazem 120 MG 24 hr capsule Commonly known as: CARDIZEM CD TAKE 1 CAPSULE (120 MG TOTAL) BY MOUTH DAILY. FOR BLOOD PRESSURE   esomeprazole 40 MG capsule Commonly known as: NEXIUM TAKE 1 CAPSULE BY MOUTH EVERY DAY   furosemide 40 MG tablet Commonly  known as: LASIX TAKE 1 TABLET BY MOUTH 2 TIMES DAILY FOR SWELLING   gabapentin 800 MG tablet Commonly known as: NEURONTIN One in the Morning, one in the afternoon and two at night What changed: See the new instructions. Changed by: Claretta Fraise, MD   Iron (Ferrous Sulfate) 325 (65 Fe) MG Tabs Take 325 mg by mouth daily. Started by: Claretta Fraise, MD   Linzess 290 MCG Caps capsule Generic drug: linaclotide TAKE 1 CAPSULE (290 MCG TOTAL) BY MOUTH DAILY. TO REGULATE BOWEL MOVEMENTS   methadone 10 MG tablet Commonly known as: Dolophine Take 1 tablet (10 mg total) by mouth 3 (three) times daily. Start taking on: Jun 10, 2020 What changed: These instructions start on Jun 10, 2020. If you are  unsure what to do until then, ask your doctor or other care provider. Changed by: Claretta Fraise, MD   methadone 10 MG tablet Commonly known as: Dolophine Take 1 tablet (10 mg total) by mouth 3 (three) times daily. Start taking on: July 10, 2020 What changed: These instructions start on July 10, 2020. If you are unsure what to do until then, ask your doctor or other care provider. Changed by: Claretta Fraise, MD   methadone 10 MG tablet Commonly known as: Dolophine Take 1 tablet (10 mg total) by mouth 3 (three) times daily. Start taking on: August 09, 2020 What changed: These instructions start on August 09, 2020. If you are unsure what to do until then, ask your doctor or other care provider. Changed by: Claretta Fraise, MD   montelukast 10 MG tablet Commonly known as: SINGULAIR TAKE 1 TABLET BY MOUTH EVERYDAY AT BEDTIME   multivitamin tablet Take 1 tablet by mouth daily.   nitroGLYCERIN 0.4 MG SL tablet Commonly known as: NITROSTAT Place under the tongue.   polyethylene glycol powder 17 GM/SCOOP powder Commonly known as: GLYCOLAX/MIRALAX USE 1 DOSING CUP ORALLY IN 8 OZ. OF WATER DAILY   predniSONE 10 MG tablet Commonly known as: DELTASONE Take 5 daily for 2 days followed by 4,3,2 and 1  for 2 days each. Started by: Claretta Fraise, MD   RABEprazole 20 MG tablet Commonly known as: ACIPHEX TAKE 1 TABLET BY MOUTH EVERY DAY   Restasis 0.05 % ophthalmic emulsion Generic drug: cycloSPORINE   Xarelto 10 MG Tabs tablet Generic drug: rivaroxaban Take 1 tablet (10 mg total) by mouth daily. What changed:   how much to take  how to take this  when to take this Changed by: Claretta Fraise, MD        Follow-up: Return in about 3 months (around 08/22/2020) for Pain.  Claretta Fraise, M.D.

## 2020-05-24 LAB — CBC WITH DIFFERENTIAL/PLATELET
Basophils Absolute: 0.1 10*3/uL (ref 0.0–0.2)
Basos: 1 %
EOS (ABSOLUTE): 0.4 10*3/uL (ref 0.0–0.4)
Eos: 6 %
Hematocrit: 34.6 % (ref 34.0–46.6)
Hemoglobin: 11 g/dL — ABNORMAL LOW (ref 11.1–15.9)
Immature Grans (Abs): 0 10*3/uL (ref 0.0–0.1)
Immature Granulocytes: 0 %
Lymphocytes Absolute: 2.6 10*3/uL (ref 0.7–3.1)
Lymphs: 37 %
MCH: 31.4 pg (ref 26.6–33.0)
MCHC: 31.8 g/dL (ref 31.5–35.7)
MCV: 99 fL — ABNORMAL HIGH (ref 79–97)
Monocytes Absolute: 0.5 10*3/uL (ref 0.1–0.9)
Monocytes: 7 %
Neutrophils Absolute: 3.4 10*3/uL (ref 1.4–7.0)
Neutrophils: 49 %
Platelets: 242 10*3/uL (ref 150–450)
RBC: 3.5 x10E6/uL — ABNORMAL LOW (ref 3.77–5.28)
RDW: 13.2 % (ref 11.7–15.4)
WBC: 7 10*3/uL (ref 3.4–10.8)

## 2020-05-24 NOTE — Progress Notes (Signed)
Hello Errin,  Your lab result is normal and/or stable.Some minor variations that are not significant are commonly marked abnormal, but do not represent any medical problem for you.  Best regards, Claretta Fraise, M.D.

## 2020-05-26 ENCOUNTER — Telehealth: Payer: Self-pay

## 2020-05-26 DIAGNOSIS — S82142D Displaced bicondylar fracture of left tibia, subsequent encounter for closed fracture with routine healing: Secondary | ICD-10-CM | POA: Diagnosis not present

## 2020-05-26 DIAGNOSIS — D631 Anemia in chronic kidney disease: Secondary | ICD-10-CM | POA: Diagnosis not present

## 2020-05-26 DIAGNOSIS — L89311 Pressure ulcer of right buttock, stage 1: Secondary | ICD-10-CM | POA: Diagnosis not present

## 2020-05-26 DIAGNOSIS — L89321 Pressure ulcer of left buttock, stage 1: Secondary | ICD-10-CM | POA: Diagnosis not present

## 2020-05-26 DIAGNOSIS — I272 Pulmonary hypertension, unspecified: Secondary | ICD-10-CM | POA: Diagnosis not present

## 2020-05-26 DIAGNOSIS — N1832 Chronic kidney disease, stage 3b: Secondary | ICD-10-CM | POA: Diagnosis not present

## 2020-05-26 DIAGNOSIS — I25118 Atherosclerotic heart disease of native coronary artery with other forms of angina pectoris: Secondary | ICD-10-CM | POA: Diagnosis not present

## 2020-05-26 DIAGNOSIS — I129 Hypertensive chronic kidney disease with stage 1 through stage 4 chronic kidney disease, or unspecified chronic kidney disease: Secondary | ICD-10-CM | POA: Diagnosis not present

## 2020-05-26 DIAGNOSIS — S82832D Other fracture of upper and lower end of left fibula, subsequent encounter for closed fracture with routine healing: Secondary | ICD-10-CM | POA: Diagnosis not present

## 2020-05-30 DIAGNOSIS — D631 Anemia in chronic kidney disease: Secondary | ICD-10-CM | POA: Diagnosis not present

## 2020-05-30 DIAGNOSIS — L89321 Pressure ulcer of left buttock, stage 1: Secondary | ICD-10-CM | POA: Diagnosis not present

## 2020-05-30 DIAGNOSIS — I272 Pulmonary hypertension, unspecified: Secondary | ICD-10-CM | POA: Diagnosis not present

## 2020-05-30 DIAGNOSIS — L89311 Pressure ulcer of right buttock, stage 1: Secondary | ICD-10-CM | POA: Diagnosis not present

## 2020-05-30 DIAGNOSIS — I129 Hypertensive chronic kidney disease with stage 1 through stage 4 chronic kidney disease, or unspecified chronic kidney disease: Secondary | ICD-10-CM | POA: Diagnosis not present

## 2020-05-30 DIAGNOSIS — I25118 Atherosclerotic heart disease of native coronary artery with other forms of angina pectoris: Secondary | ICD-10-CM | POA: Diagnosis not present

## 2020-05-30 DIAGNOSIS — S82832D Other fracture of upper and lower end of left fibula, subsequent encounter for closed fracture with routine healing: Secondary | ICD-10-CM | POA: Diagnosis not present

## 2020-05-30 DIAGNOSIS — N1832 Chronic kidney disease, stage 3b: Secondary | ICD-10-CM | POA: Diagnosis not present

## 2020-05-30 DIAGNOSIS — S82142D Displaced bicondylar fracture of left tibia, subsequent encounter for closed fracture with routine healing: Secondary | ICD-10-CM | POA: Diagnosis not present

## 2020-05-30 LAB — TOXASSURE SELECT 13 (MW), URINE

## 2020-06-03 DIAGNOSIS — I25118 Atherosclerotic heart disease of native coronary artery with other forms of angina pectoris: Secondary | ICD-10-CM | POA: Diagnosis not present

## 2020-06-03 DIAGNOSIS — S82141A Displaced bicondylar fracture of right tibia, initial encounter for closed fracture: Secondary | ICD-10-CM | POA: Diagnosis not present

## 2020-06-03 DIAGNOSIS — M25462 Effusion, left knee: Secondary | ICD-10-CM | POA: Diagnosis not present

## 2020-06-03 DIAGNOSIS — S82832D Other fracture of upper and lower end of left fibula, subsequent encounter for closed fracture with routine healing: Secondary | ICD-10-CM | POA: Diagnosis not present

## 2020-06-03 DIAGNOSIS — L89321 Pressure ulcer of left buttock, stage 1: Secondary | ICD-10-CM | POA: Diagnosis not present

## 2020-06-03 DIAGNOSIS — I272 Pulmonary hypertension, unspecified: Secondary | ICD-10-CM | POA: Diagnosis not present

## 2020-06-03 DIAGNOSIS — L89311 Pressure ulcer of right buttock, stage 1: Secondary | ICD-10-CM | POA: Diagnosis not present

## 2020-06-03 DIAGNOSIS — N1832 Chronic kidney disease, stage 3b: Secondary | ICD-10-CM | POA: Diagnosis not present

## 2020-06-03 DIAGNOSIS — I129 Hypertensive chronic kidney disease with stage 1 through stage 4 chronic kidney disease, or unspecified chronic kidney disease: Secondary | ICD-10-CM | POA: Diagnosis not present

## 2020-06-03 DIAGNOSIS — D631 Anemia in chronic kidney disease: Secondary | ICD-10-CM | POA: Diagnosis not present

## 2020-06-03 DIAGNOSIS — S82142D Displaced bicondylar fracture of left tibia, subsequent encounter for closed fracture with routine healing: Secondary | ICD-10-CM | POA: Diagnosis not present

## 2020-06-10 DIAGNOSIS — L89311 Pressure ulcer of right buttock, stage 1: Secondary | ICD-10-CM | POA: Diagnosis not present

## 2020-06-10 DIAGNOSIS — L89321 Pressure ulcer of left buttock, stage 1: Secondary | ICD-10-CM | POA: Diagnosis not present

## 2020-06-10 DIAGNOSIS — S82142D Displaced bicondylar fracture of left tibia, subsequent encounter for closed fracture with routine healing: Secondary | ICD-10-CM | POA: Diagnosis not present

## 2020-06-10 DIAGNOSIS — I129 Hypertensive chronic kidney disease with stage 1 through stage 4 chronic kidney disease, or unspecified chronic kidney disease: Secondary | ICD-10-CM | POA: Diagnosis not present

## 2020-06-10 DIAGNOSIS — S82832D Other fracture of upper and lower end of left fibula, subsequent encounter for closed fracture with routine healing: Secondary | ICD-10-CM | POA: Diagnosis not present

## 2020-06-10 DIAGNOSIS — N1832 Chronic kidney disease, stage 3b: Secondary | ICD-10-CM | POA: Diagnosis not present

## 2020-06-10 DIAGNOSIS — I272 Pulmonary hypertension, unspecified: Secondary | ICD-10-CM | POA: Diagnosis not present

## 2020-06-10 DIAGNOSIS — I25118 Atherosclerotic heart disease of native coronary artery with other forms of angina pectoris: Secondary | ICD-10-CM | POA: Diagnosis not present

## 2020-06-10 DIAGNOSIS — D631 Anemia in chronic kidney disease: Secondary | ICD-10-CM | POA: Diagnosis not present

## 2020-06-14 ENCOUNTER — Other Ambulatory Visit: Payer: Self-pay | Admitting: Family Medicine

## 2020-06-14 ENCOUNTER — Telehealth: Payer: Self-pay

## 2020-06-14 DIAGNOSIS — B0229 Other postherpetic nervous system involvement: Secondary | ICD-10-CM

## 2020-06-14 MED ORDER — METHADONE HCL 10 MG PO TABS: 10.0000 mg | ORAL_TABLET | Freq: Three times a day (TID) | ORAL | 0 refills | Status: DC

## 2020-06-14 NOTE — Telephone Encounter (Signed)
I sent the prescription into [facility].  Technically she could go ahead and get the prescription at [facility] also, so to avoid that please call [facility] to cancel the May 13 prescription of methadone, thanks.  WS.

## 2020-06-14 NOTE — Telephone Encounter (Signed)
Called [facility] to cancel Methadone prescription for May.  Contacted patient to let her know prescription was sent to [facility] instead.

## 2020-06-16 DIAGNOSIS — D631 Anemia in chronic kidney disease: Secondary | ICD-10-CM | POA: Diagnosis not present

## 2020-06-16 DIAGNOSIS — S82142D Displaced bicondylar fracture of left tibia, subsequent encounter for closed fracture with routine healing: Secondary | ICD-10-CM | POA: Diagnosis not present

## 2020-06-16 DIAGNOSIS — I129 Hypertensive chronic kidney disease with stage 1 through stage 4 chronic kidney disease, or unspecified chronic kidney disease: Secondary | ICD-10-CM | POA: Diagnosis not present

## 2020-06-16 DIAGNOSIS — I25118 Atherosclerotic heart disease of native coronary artery with other forms of angina pectoris: Secondary | ICD-10-CM | POA: Diagnosis not present

## 2020-06-16 DIAGNOSIS — S82832D Other fracture of upper and lower end of left fibula, subsequent encounter for closed fracture with routine healing: Secondary | ICD-10-CM | POA: Diagnosis not present

## 2020-06-16 DIAGNOSIS — N1832 Chronic kidney disease, stage 3b: Secondary | ICD-10-CM | POA: Diagnosis not present

## 2020-06-16 DIAGNOSIS — L89311 Pressure ulcer of right buttock, stage 1: Secondary | ICD-10-CM | POA: Diagnosis not present

## 2020-06-16 DIAGNOSIS — I272 Pulmonary hypertension, unspecified: Secondary | ICD-10-CM | POA: Diagnosis not present

## 2020-06-16 DIAGNOSIS — L89321 Pressure ulcer of left buttock, stage 1: Secondary | ICD-10-CM | POA: Diagnosis not present

## 2020-06-23 DIAGNOSIS — L89311 Pressure ulcer of right buttock, stage 1: Secondary | ICD-10-CM | POA: Diagnosis not present

## 2020-06-23 DIAGNOSIS — D631 Anemia in chronic kidney disease: Secondary | ICD-10-CM | POA: Diagnosis not present

## 2020-06-23 DIAGNOSIS — I25118 Atherosclerotic heart disease of native coronary artery with other forms of angina pectoris: Secondary | ICD-10-CM | POA: Diagnosis not present

## 2020-06-23 DIAGNOSIS — I272 Pulmonary hypertension, unspecified: Secondary | ICD-10-CM | POA: Diagnosis not present

## 2020-06-23 DIAGNOSIS — L89321 Pressure ulcer of left buttock, stage 1: Secondary | ICD-10-CM | POA: Diagnosis not present

## 2020-06-23 DIAGNOSIS — I129 Hypertensive chronic kidney disease with stage 1 through stage 4 chronic kidney disease, or unspecified chronic kidney disease: Secondary | ICD-10-CM | POA: Diagnosis not present

## 2020-06-23 DIAGNOSIS — S82142D Displaced bicondylar fracture of left tibia, subsequent encounter for closed fracture with routine healing: Secondary | ICD-10-CM | POA: Diagnosis not present

## 2020-06-23 DIAGNOSIS — N1832 Chronic kidney disease, stage 3b: Secondary | ICD-10-CM | POA: Diagnosis not present

## 2020-06-23 DIAGNOSIS — S82832D Other fracture of upper and lower end of left fibula, subsequent encounter for closed fracture with routine healing: Secondary | ICD-10-CM | POA: Diagnosis not present

## 2020-06-29 DIAGNOSIS — N1832 Chronic kidney disease, stage 3b: Secondary | ICD-10-CM | POA: Diagnosis not present

## 2020-06-29 DIAGNOSIS — I272 Pulmonary hypertension, unspecified: Secondary | ICD-10-CM | POA: Diagnosis not present

## 2020-06-29 DIAGNOSIS — S82142D Displaced bicondylar fracture of left tibia, subsequent encounter for closed fracture with routine healing: Secondary | ICD-10-CM | POA: Diagnosis not present

## 2020-06-29 DIAGNOSIS — D631 Anemia in chronic kidney disease: Secondary | ICD-10-CM | POA: Diagnosis not present

## 2020-06-29 DIAGNOSIS — I129 Hypertensive chronic kidney disease with stage 1 through stage 4 chronic kidney disease, or unspecified chronic kidney disease: Secondary | ICD-10-CM | POA: Diagnosis not present

## 2020-06-29 DIAGNOSIS — L89321 Pressure ulcer of left buttock, stage 1: Secondary | ICD-10-CM | POA: Diagnosis not present

## 2020-06-29 DIAGNOSIS — I25118 Atherosclerotic heart disease of native coronary artery with other forms of angina pectoris: Secondary | ICD-10-CM | POA: Diagnosis not present

## 2020-06-29 DIAGNOSIS — L89311 Pressure ulcer of right buttock, stage 1: Secondary | ICD-10-CM | POA: Diagnosis not present

## 2020-06-29 DIAGNOSIS — S82832D Other fracture of upper and lower end of left fibula, subsequent encounter for closed fracture with routine healing: Secondary | ICD-10-CM | POA: Diagnosis not present

## 2020-07-01 ENCOUNTER — Ambulatory Visit (INDEPENDENT_AMBULATORY_CARE_PROVIDER_SITE_OTHER): Payer: Medicare HMO

## 2020-07-01 ENCOUNTER — Telehealth: Payer: Self-pay

## 2020-07-01 VITALS — Ht 61.0 in | Wt 123.0 lb

## 2020-07-01 DIAGNOSIS — Z0000 Encounter for general adult medical examination without abnormal findings: Secondary | ICD-10-CM | POA: Diagnosis not present

## 2020-07-01 NOTE — Patient Instructions (Signed)
Sheena Morrow , Thank you for taking time to come for your Medicare Wellness Visit. I appreciate your ongoing commitment to your health goals. Please review the following plan we discussed and let me know if I can assist you in the future.   Screening recommendations/referrals: Colonoscopy: Done 11/18/2012 - No longer required Mammogram: Done 12/04/2012 - no longer getting these Bone Density: Done 04/11/2015 - Repeat every 2-5 years *due at next visit Recommended yearly ophthalmology/optometry visit for glaucoma screening and checkup Recommended yearly dental visit for hygiene and checkup  Vaccinations: Influenza vaccine: Done 11/05/2019 Repeat annually Pneumococcal vaccine: Prevnar done 08/12/2019 - due for pneumovax Tdap vaccine: Due (every 10 years) Shingles vaccine: First dose done 01/15/2017 - second dose due Covid-19: Done 04/23/2019, 05/31/2019, & 01/01/2020  Advanced directives: Advance directive discussed with you today. Even though you declined this today, please call our office should you change your mind, and we can give you the proper paperwork for you to fill out.  Conditions/risks identified: Aim for 30 minutes of exercise or brisk walking each day, drink 6-8 glasses of water and eat lots of fruits and vegetables.  Next appointment: Follow up in one year for your annual wellness visit    Preventive Care 65 Years and Older, Female Preventive care refers to lifestyle choices and visits with your health care provider that can promote health and wellness. What does preventive care include?  A yearly physical exam. This is also called an annual well check.  Dental exams once or twice a year.  Routine eye exams. Ask your health care provider how often you should have your eyes checked.  Personal lifestyle choices, including:  Daily care of your teeth and gums.  Regular physical activity.  Eating a healthy diet.  Avoiding tobacco and drug use.  Limiting alcohol  use.  Practicing safe sex.  Taking low-dose aspirin every day.  Taking vitamin and mineral supplements as recommended by your health care provider. What happens during an annual well check? The services and screenings done by your health care provider during your annual well check will depend on your age, overall health, lifestyle risk factors, and family history of disease. Counseling  Your health care provider may ask you questions about your:  Alcohol use.  Tobacco use.  Drug use.  Emotional well-being.  Home and relationship well-being.  Sexual activity.  Eating habits.  History of falls.  Memory and ability to understand (cognition).  Work and work Statistician.  Reproductive health. Screening  You may have the following tests or measurements:  Height, weight, and BMI.  Blood pressure.  Lipid and cholesterol levels. These may be checked every 5 years, or more frequently if you are over 72 years old.  Skin check.  Lung cancer screening. You may have this screening every year starting at age 42 if you have a 30-pack-year history of smoking and currently smoke or have quit within the past 15 years.  Fecal occult blood test (FOBT) of the stool. You may have this test every year starting at age 41.  Flexible sigmoidoscopy or colonoscopy. You may have a sigmoidoscopy every 5 years or a colonoscopy every 10 years starting at age 9.  Hepatitis C blood test.  Hepatitis B blood test.  Sexually transmitted disease (STD) testing.  Diabetes screening. This is done by checking your blood sugar (glucose) after you have not eaten for a while (fasting). You may have this done every 1-3 years.  Bone density scan. This is done to screen  for osteoporosis. You may have this done starting at age 55.  Mammogram. This may be done every 1-2 years. Talk to your health care provider about how often you should have regular mammograms. Talk with your health care provider about  your test results, treatment options, and if necessary, the need for more tests. Vaccines  Your health care provider may recommend certain vaccines, such as:  Influenza vaccine. This is recommended every year.  Tetanus, diphtheria, and acellular pertussis (Tdap, Td) vaccine. You may need a Td booster every 10 years.  Zoster vaccine. You may need this after age 43.  Pneumococcal 13-valent conjugate (PCV13) vaccine. One dose is recommended after age 32.  Pneumococcal polysaccharide (PPSV23) vaccine. One dose is recommended after age 61. Talk to your health care provider about which screenings and vaccines you need and how often you need them. This information is not intended to replace advice given to you by your health care provider. Make sure you discuss any questions you have with your health care provider. Document Released: 02/11/2015 Document Revised: 10/05/2015 Document Reviewed: 11/16/2014 Elsevier Interactive Patient Education  2017 [place] Prevention in the Home Falls can cause injuries. They can happen to people of all ages. There are many things you can do to make your home safe and to help prevent falls. What can I do on the outside of my home?  Regularly fix the edges of walkways and driveways and fix any cracks.  Remove anything that might make you trip as you walk through a door, such as a raised step or threshold.  Trim any bushes or trees on the path to your home.  Use bright outdoor lighting.  Clear any walking paths of anything that might make someone trip, such as rocks or tools.  Regularly check to see if handrails are loose or broken. Make sure that both sides of any steps have handrails.  Any raised decks and porches should have guardrails on the edges.  Have any leaves, snow, or ice cleared regularly.  Use sand or salt on walking paths during winter.  Clean up any spills in your garage right away. This includes oil or grease spills. What  can I do in the bathroom?  Use night lights.  Install grab bars by the toilet and in the tub and shower. Do not use towel bars as grab bars.  Use non-skid mats or decals in the tub or shower.  If you need to sit down in the shower, use a plastic, non-slip stool.  Keep the floor dry. Clean up any water that spills on the floor as soon as it happens.  Remove soap buildup in the tub or shower regularly.  Attach bath mats securely with double-sided non-slip rug tape.  Do not have throw rugs and other things on the floor that can make you trip. What can I do in the bedroom?  Use night lights.  Make sure that you have a light by your bed that is easy to reach.  Do not use any sheets or blankets that are too big for your bed. They should not hang down onto the floor.  Have a firm chair that has side arms. You can use this for support while you get dressed.  Do not have throw rugs and other things on the floor that can make you trip. What can I do in the kitchen?  Clean up any spills right away.  Avoid walking on wet floors.  Keep items that you  use a lot in easy-to-reach places.  If you need to reach something above you, use a strong step stool that has a grab bar.  Keep electrical cords out of the way.  Do not use floor polish or wax that makes floors slippery. If you must use wax, use non-skid floor wax.  Do not have throw rugs and other things on the floor that can make you trip. What can I do with my stairs?  Do not leave any items on the stairs.  Make sure that there are handrails on both sides of the stairs and use them. Fix handrails that are broken or loose. Make sure that handrails are as long as the stairways.  Check any carpeting to make sure that it is firmly attached to the stairs. Fix any carpet that is loose or worn.  Avoid having throw rugs at the top or bottom of the stairs. If you do have throw rugs, attach them to the floor with carpet tape.  Make sure  that you have a light switch at the top of the stairs and the bottom of the stairs. If you do not have them, ask someone to add them for you. What else can I do to help prevent falls?  Wear shoes that:  Do not have high heels.  Have rubber bottoms.  Are comfortable and fit you well.  Are closed at the toe. Do not wear sandals.  If you use a stepladder:  Make sure that it is fully opened. Do not climb a closed stepladder.  Make sure that both sides of the stepladder are locked into place.  Ask someone to hold it for you, if possible.  Clearly mark and make sure that you can see:  Any grab bars or handrails.  First and last steps.  Where the edge of each step is.  Use tools that help you move around (mobility aids) if they are needed. These include:  Canes.  Walkers.  Scooters.  Crutches.  Turn on the lights when you go into a dark area. Replace any light bulbs as soon as they burn out.  Set up your furniture so you have a clear path. Avoid moving your furniture around.  If any of your floors are uneven, fix them.  If there are any pets around you, be aware of where they are.  Review your medicines with your doctor. Some medicines can make you feel dizzy. This can increase your chance of falling. Ask your doctor what other things that you can do to help prevent falls. This information is not intended to replace advice given to you by your health care provider. Make sure you discuss any questions you have with your health care provider. Document Released: 11/11/2008 Document Revised: 06/23/2015 Document Reviewed: 02/19/2014 Elsevier Interactive Patient Education  2017 [employer].

## 2020-07-01 NOTE — Progress Notes (Signed)
Subjective:   Sheena Morrow is a 87 y.o. female who presents for Medicare Annual (Subsequent) preventive examination.  Virtual Visit via Telephone Note  I connected with  Sheena Morrow on 07/01/20 at 10:30 AM EDT by telephone and verified that I am speaking with the correct person using two identifiers.  Location: Patient: Home Provider: [facility] Persons participating in the virtual visit: patient/Nurse Health Advisor   I discussed the limitations, risks, security and privacy concerns of performing an evaluation and management service by telephone and the availability of in person appointments. The patient expressed understanding and agreed to proceed.  Interactive audio and video telecommunications were attempted between this nurse and patient, however failed, due to patient having technical difficulties OR patient did not have access to video capability.  We continued and completed visit with audio only.  Some vital signs may be absent or patient reported.   Kallan Bischoff E Margareta Laureano, LPN   Review of Systems     Cardiac Risk Factors include: advanced age (>75men, >26 women);dyslipidemia;hypertension;smoking/ tobacco exposure;sedentary lifestyle     Objective:    Today's Vitals   07/01/20 0936  Weight: 123 lb (55.8 kg)  Height: 5\' 1"  (1.549 m)   Body mass index is 23.24 kg/m.  Advanced Directives 07/01/2020 07/01/2019 06/19/2018  Does Patient Have a Medical Advance Directive? No No No  Would patient like information on creating a medical advance directive? No - Patient declined No - Patient declined Yes (MAU/Ambulatory/Procedural Areas - Information given)    Current Medications (verified) Outpatient Encounter Medications as of 07/01/2020  Medication Sig  . aspirin EC 81 MG EC tablet Take 1 tablet (81 mg total) by mouth daily.  Marland Kitchen atorvastatin (LIPITOR) 20 MG tablet TAKE 1 TABLET BY MOUTH EVERY DAY  . Biotin 10000 MCG TABS Take by mouth.  . diltiazem (CARDIZEM CD) 120 MG 24 hr capsule  TAKE 1 CAPSULE (120 MG TOTAL) BY MOUTH DAILY. FOR BLOOD PRESSURE  . esomeprazole (NEXIUM) 40 MG capsule TAKE 1 CAPSULE BY MOUTH EVERY DAY (Patient not taking: Reported on 05/23/2020)  . furosemide (LASIX) 40 MG tablet TAKE 1 TABLET BY MOUTH 2 TIMES DAILY FOR SWELLING (Patient not taking: Reported on 05/23/2020)  . gabapentin (NEURONTIN) 800 MG tablet One in the Morning, one in the afternoon and two at night  . Iron, Ferrous Sulfate, 325 (65 Fe) MG TABS Take 325 mg by mouth daily.  Marland Kitchen LINZESS 290 MCG CAPS capsule TAKE 1 CAPSULE (290 MCG TOTAL) BY MOUTH DAILY. TO REGULATE BOWEL MOVEMENTS  . [START ON 08/09/2020] methadone (DOLOPHINE) 10 MG tablet Take 1 tablet (10 mg total) by mouth 3 (three) times daily.  Derrill Memo ON 07/10/2020] methadone (DOLOPHINE) 10 MG tablet Take 1 tablet (10 mg total) by mouth 3 (three) times daily.  . methadone (DOLOPHINE) 10 MG tablet Take 1 tablet (10 mg total) by mouth 3 (three) times daily.  . montelukast (SINGULAIR) 10 MG tablet TAKE 1 TABLET BY MOUTH EVERYDAY AT BEDTIME  . Multiple Vitamin (MULTIVITAMIN) tablet Take 1 tablet by mouth daily.  . nitroGLYCERIN (NITROSTAT) 0.4 MG SL tablet Place under the tongue.  . polyethylene glycol powder (GLYCOLAX/MIRALAX) powder USE 1 DOSING CUP ORALLY IN 8 OZ. OF WATER DAILY  . predniSONE (DELTASONE) 10 MG tablet Take 5 daily for 2 days followed by 4,3,2 and 1 for 2 days each.  . RABEprazole (ACIPHEX) 20 MG tablet TAKE 1 TABLET BY MOUTH EVERY DAY  . RESTASIS 0.05 % ophthalmic emulsion   . XARELTO 10 MG  TABS tablet Take 1 tablet (10 mg total) by mouth daily.   No facility-administered encounter medications on file as of 07/01/2020.    Allergies (verified) Nsaids, Dexilant [dexlansoprazole], Pantoprazole, and Raloxifene   History: Past Medical History:  Diagnosis Date  . Clotting disorder ([place])   . Constipation due to opioid therapy   . Old MI (myocardial infarction) 07/21/2015  . Osteoporosis   . Postherpetic neuralgia   .  Systolic CHF with reduced left ventricular function, NYHA class 2 ([place]) 05/21/2015   Past Surgical History:  Procedure Laterality Date  . HIP SURGERY     History reviewed. No pertinent family history. Social History   Socioeconomic History  . Marital status: Divorced    Spouse name: Not on file  . Number of children: 1  . Years of education: 45  . Highest education level: 11th grade  Occupational History  . Occupation: Retired  Tobacco Use  . Smoking status: Former Smoker    Packs/day: 0.50    Types: Cigarettes    Start date: 04/01/1969    Quit date: 04/01/2008    Years since quitting: 12.2  . Smokeless tobacco: Never Used  Vaping Use  . Vaping Use: Never used  Substance and Sexual Activity  . Alcohol use: No    Alcohol/week: 0.0 standard drinks  . Drug use: No  . Sexual activity: Not Currently  Other Topics Concern  . Not on file  Social History Narrative   Lives alone in apartment - trying to find apartment without stairs   Social Determinants of Health   Financial Resource Strain: Low Risk   . Difficulty of Paying Living Expenses: Not hard at all  Food Insecurity: No Food Insecurity  . Worried About Charity fundraiser in the Last Year: Never true  . Ran Out of Food in the Last Year: Never true  Transportation Needs: No Transportation Needs  . Lack of Transportation (Medical): No  . Lack of Transportation (Non-Medical): No  Physical Activity: Inactive  . Days of Exercise per Week: 0 days  . Minutes of Exercise per Session: 0 min  Stress: No Stress Concern Present  . Feeling of Stress : Only a little  Social Connections: Socially Isolated  . Frequency of Communication with Friends and Family: More than three times a week  . Frequency of Social Gatherings with Friends and Family: Once a week  . Attends Religious Services: Never  . Active Member of Clubs or Organizations: No  . Attends Archivist Meetings: Never  . Marital Status: Divorced    Tobacco  Counseling Counseling given: Not Answered   Clinical Intake:  Pre-visit preparation completed: Yes  Pain : No/denies pain     BMI - recorded: 23.24 Nutritional Status: BMI of 19-24  Normal Nutritional Risks: None Diabetes: No  How often do you need to have someone help you when you read instructions, pamphlets, or other written materials from your doctor or pharmacy?: 1 - Never  Diabetic? no  Interpreter Needed?: No  Information entered by :: Belynda Pagaduan, LPN   Activities of Daily Living In your present state of health, do you have any difficulty performing the following activities: 07/01/2020  Hearing? 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 N  Difficulty concentrating or making decisions? Y  Walking or climbing stairs? Y  Dressing or bathing? N  Doing errands, shopping? N  Preparing Food and eating ? N  Using the Toilet? N  In  the past six months, have you accidently leaked urine? N  Do you have problems with loss of bowel control? N  Managing your Medications? N  Managing your Finances? N  Housekeeping or managing your Housekeeping? N  Some recent data might be hidden    Patient Care Team: Claretta Fraise, MD as PCP - General (Family Medicine)  Indicate any recent Medical Services you may have received from other than [facility] providers in the past year (date may be approximate).     Assessment:   This is a routine wellness examination for Sheena Morrow.  Hearing/Vision screen  Hearing Screening   125Hz  250Hz  500Hz  1000Hz  2000Hz  3000Hz  4000Hz  6000Hz  8000Hz   Right ear:           Left ear:           Comments: Denies hearing difficulties   Vision Screening Comments: Wears eyeglasses - up to date with annual eye exam with Dr Manuella Ghazi at [facility] in [place] issues and exercise activities discussed: Current Exercise Habits: The patient does not participate in regular exercise at present, Exercise limited by: orthopedic  condition(s)  Goals Addressed            This Visit's Progress   . Exercise 3x per week (30 min per time)   Not on track   . Have 3 meals a day   On track   . Patient Stated   On track    07/01/2019 AWV Goal: Keep All Scheduled Appointments  Over the next year, patient will attend all scheduled appointments with their PCP and any specialists that they see.       Depression Screen PHQ 2/9 Scores 07/01/2020 05/23/2020 11/05/2019 08/12/2019 07/01/2019 06/24/2019 05/13/2019  PHQ - 2 Score 0 0 0 0 0 0 0    Fall Risk Fall Risk  07/01/2020 05/23/2020 05/23/2020 11/05/2019 08/12/2019  Falls in the past year? 1 1 0 0 0  Number falls in past yr: 0 0 - - -  Injury with Fall? 1 1 - - -  Risk for fall due to : History of fall(s);Impaired balance/gait;Orthopedic patient;Impaired vision;Medication side effect History of fall(s);Impaired balance/gait - - -  Follow up Education provided;Falls prevention discussed Falls evaluation completed - Falls evaluation completed -    FALL RISK PREVENTION PERTAINING TO THE HOME:  Any stairs in or around the home? Yes  If so, are there any without handrails? No  Home free of loose throw rugs in walkways, pet beds, electrical cords, etc? Yes  Adequate lighting in your home to reduce risk of falls? Yes   ASSISTIVE DEVICES UTILIZED TO PREVENT FALLS:  Life alert? Yes  Use of a cane, walker or w/c? Yes  Grab bars in the bathroom? Yes  Shower chair or bench in shower? Yes  Elevated toilet seat or a handicapped toilet? Yes   TIMED UP AND GO:  Was the test performed? No . Telephonic visit.  Cognitive Function:     6CIT Screen 07/01/2020 07/01/2019 06/19/2018  What Year? 0 points 0 points 0 points  What month? 0 points 0 points 0 points  What time? 0 points 0 points 0 points  Count back from 20 0 points 0 points 0 points  Months in reverse 4 points 0 points 0 points  Repeat phrase 0 points 2 points 0 points  Total Score 4 2 0    Immunizations Immunization History   Administered Date(s) Administered  . Fluad Quad(high Dose 65+) 11/11/2018, 11/05/2019  .  Influenza, High Dose Seasonal PF 10/15/2016, 11/19/2017  . Influenza,inj,Quad PF,6+ Mos 01/04/2015, 11/03/2015  . PFIZER(Purple Top)SARS-COV-2 Vaccination 04/23/2019, 05/31/2019, 01/01/2020  . Pneumococcal Conjugate-13 08/12/2019  . Zoster Recombinat (Shingrix) 01/15/2017    TDAP status: Due, Education has been provided regarding the importance of this vaccine. Advised may receive this vaccine at local pharmacy or Health Dept. Aware to provide a copy of the vaccination record if obtained from local pharmacy or Health Dept. Verbalized acceptance and understanding.  Flu Vaccine status: Up to date  Pneumococcal vaccine status: Due, Education has been provided regarding the importance of this vaccine. Advised may receive this vaccine at local pharmacy or Health Dept. Aware to provide a copy of the vaccination record if obtained from local pharmacy or Health Dept. Verbalized acceptance and understanding.  Covid-19 vaccine status: Completed vaccines  Qualifies for Shingles Vaccine? Yes   Zostavax completed No   Shingrix Completed?: No.    Education has been provided regarding the importance of this vaccine. Patient has been advised to call insurance company to determine out of pocket expense if they have not yet received this vaccine. Advised may also receive vaccine at local pharmacy or Health Dept. Verbalized acceptance and understanding.  Screening Tests Health Maintenance  Topic Date Due  . Pneumococcal Vaccine 53-16 Years old (1 of 4 - PCV13) Never done  . Zoster Vaccines- Shingrix (2 of 2) 03/12/2017  . DEXA SCAN  08/11/2020 (Originally 04/10/2016)  . TETANUS/TDAP  08/11/2020 (Originally 05/04/1952)  . PNA vac Low Risk Adult (2 of 2 - PPSV23) 08/11/2020  . INFLUENZA VACCINE  08/29/2020  . COVID-19 Vaccine  Completed  . HPV VACCINES  Aged Out    Health Maintenance  Health Maintenance Due  Topic  Date Due  . Pneumococcal Vaccine 58-43 Years old (1 of 4 - PCV13) Never done  . Zoster Vaccines- Shingrix (2 of 2) 03/12/2017    Colorectal cancer screening: No longer required.   Mammogram status: No longer required due to age.  Bone Density status: Completed 04/11/2015. Results reflect: Bone density results: NORMAL. Repeat every 5 years.  Lung Cancer Screening: (Low Dose CT Chest recommended if Age 62-80 years, 30 pack-year currently smoking OR have quit w/in 15years.) does not qualify.   Additional Screening:  Hepatitis C Screening: does not qualify  Vision Screening: Recommended annual ophthalmology exams for early detection of glaucoma and other disorders of the eye. Is the patient up to date with their annual eye exam?  Yes  Who is the provider or what is the name of the office in which the patient attends annual eye exams? Manuella Ghazi If pt is not established with a provider, would they like to be referred to a provider to establish care? No .   Dental Screening: Recommended annual dental exams for proper oral hygiene  Community Resource Referral / Chronic Care Management: CRR required this visit?  No   CCM required this visit?  No      Plan:     I have personally reviewed and noted the following in the patient's chart:   . Medical and social history . Use of alcohol, tobacco or illicit drugs  . Current medications and supplements including opioid prescriptions.  . Functional ability and status . Nutritional status . Physical activity . Advanced directives . List of other physicians . Hospitalizations, surgeries, and ER visits in previous 12 months . Vitals . Screenings to include cognitive, depression, and falls . Referrals and appointments  In addition, I have reviewed and  discussed with patient certain preventive protocols, quality metrics, and best practice recommendations. A written personalized care plan for preventive services as well as general preventive health  recommendations were provided to patient.     Sandrea Hammond, LPN   05/30/8982   Nurse Notes: None

## 2020-07-01 NOTE — Telephone Encounter (Signed)
-----   Message from Sandrea Hammond, LPN sent at 09/01/7205 12:03 PM EDT ----- Regarding: FW: leg swelling Contact: [phone]  ----- Message ----- From: Gwenlyn Perking, FNP Sent: 07/01/2020  10:07 AM EDT To: Mervyn Gay Hopkins, LPN Subject: RE: leg swelling                               Please have her schedule an appointment to be seen.  ----- Message ----- From: Sandrea Hammond, LPN Sent: 05/04/1458   9:51 AM EDT To: Gwenlyn Perking, FNP Subject: leg swelling                                   She fractured left leg 11/2019, still using walker - for the last 2 weeks her right leg has been swelling - she is limiting salt and elevating legs as much as possible. Wants advice.

## 2020-07-04 DIAGNOSIS — S82141A Displaced bicondylar fracture of right tibia, initial encounter for closed fracture: Secondary | ICD-10-CM | POA: Diagnosis not present

## 2020-07-04 DIAGNOSIS — M25462 Effusion, left knee: Secondary | ICD-10-CM | POA: Diagnosis not present

## 2020-07-06 ENCOUNTER — Other Ambulatory Visit: Payer: Self-pay | Admitting: Family Medicine

## 2020-07-07 ENCOUNTER — Telehealth: Payer: Self-pay | Admitting: Family Medicine

## 2020-07-07 DIAGNOSIS — S0990XA Unspecified injury of head, initial encounter: Secondary | ICD-10-CM | POA: Diagnosis not present

## 2020-07-07 DIAGNOSIS — R2240 Localized swelling, mass and lump, unspecified lower limb: Secondary | ICD-10-CM | POA: Diagnosis not present

## 2020-07-07 DIAGNOSIS — Z7901 Long term (current) use of anticoagulants: Secondary | ICD-10-CM | POA: Diagnosis not present

## 2020-07-07 DIAGNOSIS — M7989 Other specified soft tissue disorders: Secondary | ICD-10-CM | POA: Diagnosis not present

## 2020-07-07 DIAGNOSIS — R600 Localized edema: Secondary | ICD-10-CM | POA: Diagnosis not present

## 2020-07-07 DIAGNOSIS — Z79899 Other long term (current) drug therapy: Secondary | ICD-10-CM | POA: Diagnosis not present

## 2020-07-07 DIAGNOSIS — D539 Nutritional anemia, unspecified: Secondary | ICD-10-CM | POA: Diagnosis not present

## 2020-07-07 DIAGNOSIS — N390 Urinary tract infection, site not specified: Secondary | ICD-10-CM | POA: Diagnosis not present

## 2020-07-08 DIAGNOSIS — D539 Nutritional anemia, unspecified: Secondary | ICD-10-CM | POA: Diagnosis not present

## 2020-07-08 DIAGNOSIS — R600 Localized edema: Secondary | ICD-10-CM | POA: Diagnosis not present

## 2020-07-08 DIAGNOSIS — S0990XA Unspecified injury of head, initial encounter: Secondary | ICD-10-CM | POA: Diagnosis not present

## 2020-07-11 ENCOUNTER — Telehealth: Payer: Self-pay

## 2020-07-11 NOTE — Telephone Encounter (Signed)
Transition Care Management Unsuccessful Follow-up Telephone Call  Date of discharge and from where:  07/08/20 [facility]  Diagnosis: Fall resulting in Head injury   Attempts:  1st Attempt  Reason for unsuccessful TCM follow-up call:  Left voice message

## 2020-07-12 NOTE — Telephone Encounter (Signed)
Transition Care Management Unsuccessful Follow-up Telephone Call  Date of discharge and from where:  [facility]row 07/08/20  Diagnosis: fall resulting in head injury   Attempts:  2nd Attempt  Reason for unsuccessful TCM follow-up call:  Left voice message

## 2020-07-13 NOTE — Telephone Encounter (Signed)
Transition Care Management Unsuccessful Follow-up Telephone Call  Date of discharge and from where:  [facility] 07/08/20  Diagnosis: fall resulting in head injury   Attempts:  4th attempt  Reason for unsuccessful TCM follow-up call:  Unable to reach patient

## 2020-07-13 NOTE — Telephone Encounter (Signed)
Transition Care Management Unsuccessful Follow-up Telephone Call  Date of discharge and from where:  [facility] Mor[facility] 07/08/20  Diagnosis: fall resulting in head injury   Attempts:  3rd Attempt  Reason for unsuccessful TCM follow-up call:  No answer/busy

## 2020-07-15 ENCOUNTER — Other Ambulatory Visit: Payer: Self-pay | Admitting: Family Medicine

## 2020-07-15 MED ORDER — METHADONE HCL 10 MG PO TABS: 10.0000 mg | ORAL_TABLET | Freq: Three times a day (TID) | ORAL | 0 refills | Status: DC

## 2020-07-15 NOTE — Telephone Encounter (Signed)
Sent 1 month prescription for the patient, follow-up with PCP

## 2020-07-15 NOTE — Telephone Encounter (Signed)
  Prescription Request  07/15/2020  What is the name of the medication or equipment? Methodone/ pt said she needs today  Have you contacted your pharmacy to request a refill? (if applicable) no  Which pharmacy would you like this sent to? [facility] in [place]   Patient notified that their request is being sent to the clinical staff for review and that they should receive a response within 2 business days.

## 2020-07-18 ENCOUNTER — Other Ambulatory Visit: Payer: Self-pay | Admitting: Family Medicine

## 2020-07-18 DIAGNOSIS — B0229 Other postherpetic nervous system involvement: Secondary | ICD-10-CM

## 2020-07-18 MED ORDER — METHADONE HCL 10 MG PO TABS: 10.0000 mg | ORAL_TABLET | Freq: Three times a day (TID) | ORAL | 0 refills | Status: DC

## 2020-07-18 NOTE — Telephone Encounter (Signed)
Please see note sent to Dr D- he filled one of the months for pt, BUT she will need one more before her appt - sent to [facility] (not [facility])

## 2020-07-21 DIAGNOSIS — E440 Moderate protein-calorie malnutrition: Secondary | ICD-10-CM | POA: Diagnosis not present

## 2020-07-21 DIAGNOSIS — A048 Other specified bacterial intestinal infections: Secondary | ICD-10-CM | POA: Diagnosis not present

## 2020-07-21 DIAGNOSIS — Z9889 Other specified postprocedural states: Secondary | ICD-10-CM | POA: Diagnosis not present

## 2020-07-21 DIAGNOSIS — I272 Pulmonary hypertension, unspecified: Secondary | ICD-10-CM | POA: Diagnosis not present

## 2020-07-21 DIAGNOSIS — I340 Nonrheumatic mitral (valve) insufficiency: Secondary | ICD-10-CM | POA: Diagnosis not present

## 2020-07-21 DIAGNOSIS — B0229 Other postherpetic nervous system involvement: Secondary | ICD-10-CM | POA: Diagnosis not present

## 2020-07-21 DIAGNOSIS — A414 Sepsis due to anaerobes: Secondary | ICD-10-CM | POA: Diagnosis not present

## 2020-07-21 DIAGNOSIS — K6389 Other specified diseases of intestine: Secondary | ICD-10-CM | POA: Diagnosis not present

## 2020-07-21 DIAGNOSIS — R6521 Severe sepsis with septic shock: Secondary | ICD-10-CM | POA: Diagnosis not present

## 2020-07-21 DIAGNOSIS — N179 Acute kidney failure, unspecified: Secondary | ICD-10-CM | POA: Diagnosis not present

## 2020-07-21 DIAGNOSIS — I517 Cardiomegaly: Secondary | ICD-10-CM | POA: Diagnosis not present

## 2020-07-21 DIAGNOSIS — E860 Dehydration: Secondary | ICD-10-CM | POA: Diagnosis not present

## 2020-07-21 DIAGNOSIS — A4151 Sepsis due to Escherichia coli [E. coli]: Secondary | ICD-10-CM | POA: Diagnosis not present

## 2020-07-21 DIAGNOSIS — I5020 Unspecified systolic (congestive) heart failure: Secondary | ICD-10-CM | POA: Diagnosis not present

## 2020-07-21 DIAGNOSIS — I480 Paroxysmal atrial fibrillation: Secondary | ICD-10-CM | POA: Diagnosis not present

## 2020-07-21 DIAGNOSIS — G8929 Other chronic pain: Secondary | ICD-10-CM | POA: Diagnosis not present

## 2020-07-21 DIAGNOSIS — K5909 Other constipation: Secondary | ICD-10-CM | POA: Diagnosis not present

## 2020-07-21 DIAGNOSIS — E441 Mild protein-calorie malnutrition: Secondary | ICD-10-CM | POA: Diagnosis not present

## 2020-07-21 DIAGNOSIS — N184 Chronic kidney disease, stage 4 (severe): Secondary | ICD-10-CM | POA: Diagnosis not present

## 2020-07-21 DIAGNOSIS — I482 Chronic atrial fibrillation, unspecified: Secondary | ICD-10-CM | POA: Diagnosis not present

## 2020-07-21 DIAGNOSIS — K625 Hemorrhage of anus and rectum: Secondary | ICD-10-CM | POA: Diagnosis not present

## 2020-07-21 DIAGNOSIS — K5900 Constipation, unspecified: Secondary | ICD-10-CM | POA: Diagnosis not present

## 2020-07-21 DIAGNOSIS — M25462 Effusion, left knee: Secondary | ICD-10-CM | POA: Diagnosis not present

## 2020-07-21 DIAGNOSIS — K921 Melena: Secondary | ICD-10-CM | POA: Diagnosis not present

## 2020-07-21 DIAGNOSIS — K219 Gastro-esophageal reflux disease without esophagitis: Secondary | ICD-10-CM | POA: Diagnosis not present

## 2020-07-21 DIAGNOSIS — K9289 Other specified diseases of the digestive system: Secondary | ICD-10-CM | POA: Diagnosis not present

## 2020-07-21 DIAGNOSIS — R059 Cough, unspecified: Secondary | ICD-10-CM | POA: Diagnosis not present

## 2020-07-21 DIAGNOSIS — K5939 Other megacolon: Secondary | ICD-10-CM | POA: Diagnosis not present

## 2020-07-21 DIAGNOSIS — A411 Sepsis due to other specified staphylococcus: Secondary | ICD-10-CM | POA: Diagnosis not present

## 2020-07-21 DIAGNOSIS — R531 Weakness: Secondary | ICD-10-CM | POA: Diagnosis not present

## 2020-07-21 DIAGNOSIS — A0472 Enterocolitis due to Clostridium difficile, not specified as recurrent: Secondary | ICD-10-CM | POA: Diagnosis not present

## 2020-07-21 DIAGNOSIS — E871 Hypo-osmolality and hyponatremia: Secondary | ICD-10-CM | POA: Diagnosis not present

## 2020-07-21 DIAGNOSIS — Z452 Encounter for adjustment and management of vascular access device: Secondary | ICD-10-CM | POA: Diagnosis not present

## 2020-07-21 DIAGNOSIS — N3001 Acute cystitis with hematuria: Secondary | ICD-10-CM | POA: Diagnosis not present

## 2020-07-21 DIAGNOSIS — N183 Chronic kidney disease, stage 3 unspecified: Secondary | ICD-10-CM | POA: Diagnosis not present

## 2020-07-21 DIAGNOSIS — I252 Old myocardial infarction: Secondary | ICD-10-CM | POA: Diagnosis not present

## 2020-07-21 DIAGNOSIS — K529 Noninfective gastroenteritis and colitis, unspecified: Secondary | ICD-10-CM | POA: Diagnosis not present

## 2020-07-21 DIAGNOSIS — I25118 Atherosclerotic heart disease of native coronary artery with other forms of angina pectoris: Secondary | ICD-10-CM | POA: Diagnosis not present

## 2020-07-21 DIAGNOSIS — J45909 Unspecified asthma, uncomplicated: Secondary | ICD-10-CM | POA: Diagnosis not present

## 2020-07-21 DIAGNOSIS — D638 Anemia in other chronic diseases classified elsewhere: Secondary | ICD-10-CM | POA: Diagnosis not present

## 2020-07-21 DIAGNOSIS — N1832 Chronic kidney disease, stage 3b: Secondary | ICD-10-CM | POA: Diagnosis not present

## 2020-07-21 DIAGNOSIS — I2510 Atherosclerotic heart disease of native coronary artery without angina pectoris: Secondary | ICD-10-CM | POA: Diagnosis not present

## 2020-07-21 DIAGNOSIS — I4811 Longstanding persistent atrial fibrillation: Secondary | ICD-10-CM | POA: Diagnosis not present

## 2020-07-21 DIAGNOSIS — S82141A Displaced bicondylar fracture of right tibia, initial encounter for closed fracture: Secondary | ICD-10-CM | POA: Diagnosis not present

## 2020-07-21 DIAGNOSIS — R1032 Left lower quadrant pain: Secondary | ICD-10-CM | POA: Diagnosis not present

## 2020-07-21 DIAGNOSIS — N200 Calculus of kidney: Secondary | ICD-10-CM | POA: Diagnosis not present

## 2020-07-21 DIAGNOSIS — K648 Other hemorrhoids: Secondary | ICD-10-CM | POA: Diagnosis not present

## 2020-07-21 DIAGNOSIS — G894 Chronic pain syndrome: Secondary | ICD-10-CM | POA: Diagnosis not present

## 2020-07-21 DIAGNOSIS — I130 Hypertensive heart and chronic kidney disease with heart failure and stage 1 through stage 4 chronic kidney disease, or unspecified chronic kidney disease: Secondary | ICD-10-CM | POA: Diagnosis not present

## 2020-07-21 DIAGNOSIS — I82412 Acute embolism and thrombosis of left femoral vein: Secondary | ICD-10-CM | POA: Diagnosis not present

## 2020-07-21 DIAGNOSIS — R197 Diarrhea, unspecified: Secondary | ICD-10-CM | POA: Diagnosis not present

## 2020-07-21 DIAGNOSIS — R109 Unspecified abdominal pain: Secondary | ICD-10-CM | POA: Diagnosis not present

## 2020-07-21 DIAGNOSIS — N281 Cyst of kidney, acquired: Secondary | ICD-10-CM | POA: Diagnosis not present

## 2020-07-21 DIAGNOSIS — I4891 Unspecified atrial fibrillation: Secondary | ICD-10-CM | POA: Diagnosis not present

## 2020-07-21 DIAGNOSIS — A419 Sepsis, unspecified organism: Secondary | ICD-10-CM | POA: Diagnosis not present

## 2020-07-21 DIAGNOSIS — R188 Other ascites: Secondary | ICD-10-CM | POA: Diagnosis not present

## 2020-07-21 DIAGNOSIS — J90 Pleural effusion, not elsewhere classified: Secondary | ICD-10-CM | POA: Diagnosis not present

## 2020-07-21 DIAGNOSIS — K649 Unspecified hemorrhoids: Secondary | ICD-10-CM | POA: Diagnosis not present

## 2020-07-21 DIAGNOSIS — N390 Urinary tract infection, site not specified: Secondary | ICD-10-CM | POA: Diagnosis not present

## 2020-07-21 DIAGNOSIS — R918 Other nonspecific abnormal finding of lung field: Secondary | ICD-10-CM | POA: Diagnosis not present

## 2020-07-22 DIAGNOSIS — N200 Calculus of kidney: Secondary | ICD-10-CM | POA: Diagnosis not present

## 2020-07-22 DIAGNOSIS — N390 Urinary tract infection, site not specified: Secondary | ICD-10-CM | POA: Diagnosis not present

## 2020-07-22 DIAGNOSIS — N281 Cyst of kidney, acquired: Secondary | ICD-10-CM | POA: Diagnosis not present

## 2020-07-22 DIAGNOSIS — I82412 Acute embolism and thrombosis of left femoral vein: Secondary | ICD-10-CM | POA: Diagnosis not present

## 2020-07-22 DIAGNOSIS — R531 Weakness: Secondary | ICD-10-CM | POA: Diagnosis not present

## 2020-07-22 DIAGNOSIS — E441 Mild protein-calorie malnutrition: Secondary | ICD-10-CM | POA: Diagnosis not present

## 2020-07-22 DIAGNOSIS — R6521 Severe sepsis with septic shock: Secondary | ICD-10-CM | POA: Diagnosis not present

## 2020-07-22 DIAGNOSIS — A419 Sepsis, unspecified organism: Secondary | ICD-10-CM | POA: Diagnosis not present

## 2020-07-22 DIAGNOSIS — I5020 Unspecified systolic (congestive) heart failure: Secondary | ICD-10-CM | POA: Diagnosis not present

## 2020-07-22 DIAGNOSIS — I480 Paroxysmal atrial fibrillation: Secondary | ICD-10-CM | POA: Diagnosis not present

## 2020-07-22 DIAGNOSIS — N179 Acute kidney failure, unspecified: Secondary | ICD-10-CM | POA: Diagnosis not present

## 2020-07-22 DIAGNOSIS — N183 Chronic kidney disease, stage 3 unspecified: Secondary | ICD-10-CM | POA: Diagnosis not present

## 2020-07-22 DIAGNOSIS — K529 Noninfective gastroenteritis and colitis, unspecified: Secondary | ICD-10-CM | POA: Diagnosis not present

## 2020-07-23 DIAGNOSIS — I517 Cardiomegaly: Secondary | ICD-10-CM | POA: Diagnosis not present

## 2020-07-23 DIAGNOSIS — G894 Chronic pain syndrome: Secondary | ICD-10-CM | POA: Diagnosis not present

## 2020-07-23 DIAGNOSIS — K529 Noninfective gastroenteritis and colitis, unspecified: Secondary | ICD-10-CM | POA: Diagnosis not present

## 2020-07-23 DIAGNOSIS — A414 Sepsis due to anaerobes: Secondary | ICD-10-CM | POA: Diagnosis not present

## 2020-07-23 DIAGNOSIS — I482 Chronic atrial fibrillation, unspecified: Secondary | ICD-10-CM | POA: Diagnosis not present

## 2020-07-23 DIAGNOSIS — I4891 Unspecified atrial fibrillation: Secondary | ICD-10-CM | POA: Diagnosis not present

## 2020-07-23 DIAGNOSIS — A411 Sepsis due to other specified staphylococcus: Secondary | ICD-10-CM | POA: Diagnosis not present

## 2020-07-23 DIAGNOSIS — A0472 Enterocolitis due to Clostridium difficile, not specified as recurrent: Secondary | ICD-10-CM | POA: Diagnosis not present

## 2020-07-23 DIAGNOSIS — N3001 Acute cystitis with hematuria: Secondary | ICD-10-CM | POA: Diagnosis not present

## 2020-07-23 DIAGNOSIS — R109 Unspecified abdominal pain: Secondary | ICD-10-CM | POA: Diagnosis not present

## 2020-07-23 DIAGNOSIS — I340 Nonrheumatic mitral (valve) insufficiency: Secondary | ICD-10-CM | POA: Diagnosis not present

## 2020-07-23 DIAGNOSIS — A4151 Sepsis due to Escherichia coli [E. coli]: Secondary | ICD-10-CM | POA: Diagnosis not present

## 2020-07-23 DIAGNOSIS — R6521 Severe sepsis with septic shock: Secondary | ICD-10-CM | POA: Diagnosis not present

## 2020-07-24 DIAGNOSIS — K529 Noninfective gastroenteritis and colitis, unspecified: Secondary | ICD-10-CM | POA: Diagnosis not present

## 2020-07-24 DIAGNOSIS — N3001 Acute cystitis with hematuria: Secondary | ICD-10-CM | POA: Diagnosis not present

## 2020-07-24 DIAGNOSIS — G894 Chronic pain syndrome: Secondary | ICD-10-CM | POA: Diagnosis not present

## 2020-07-24 DIAGNOSIS — R6521 Severe sepsis with septic shock: Secondary | ICD-10-CM | POA: Diagnosis not present

## 2020-07-24 DIAGNOSIS — A0472 Enterocolitis due to Clostridium difficile, not specified as recurrent: Secondary | ICD-10-CM | POA: Diagnosis not present

## 2020-07-24 DIAGNOSIS — I482 Chronic atrial fibrillation, unspecified: Secondary | ICD-10-CM | POA: Diagnosis not present

## 2020-07-24 DIAGNOSIS — A414 Sepsis due to anaerobes: Secondary | ICD-10-CM | POA: Diagnosis not present

## 2020-07-24 DIAGNOSIS — A4151 Sepsis due to Escherichia coli [E. coli]: Secondary | ICD-10-CM | POA: Diagnosis not present

## 2020-07-24 DIAGNOSIS — A411 Sepsis due to other specified staphylococcus: Secondary | ICD-10-CM | POA: Diagnosis not present

## 2020-07-25 DIAGNOSIS — A4151 Sepsis due to Escherichia coli [E. coli]: Secondary | ICD-10-CM | POA: Diagnosis not present

## 2020-07-25 DIAGNOSIS — K529 Noninfective gastroenteritis and colitis, unspecified: Secondary | ICD-10-CM | POA: Diagnosis not present

## 2020-07-25 DIAGNOSIS — I482 Chronic atrial fibrillation, unspecified: Secondary | ICD-10-CM | POA: Diagnosis not present

## 2020-07-25 DIAGNOSIS — N3001 Acute cystitis with hematuria: Secondary | ICD-10-CM | POA: Diagnosis not present

## 2020-07-25 DIAGNOSIS — A414 Sepsis due to anaerobes: Secondary | ICD-10-CM | POA: Diagnosis not present

## 2020-07-25 DIAGNOSIS — A411 Sepsis due to other specified staphylococcus: Secondary | ICD-10-CM | POA: Diagnosis not present

## 2020-07-25 DIAGNOSIS — G894 Chronic pain syndrome: Secondary | ICD-10-CM | POA: Diagnosis not present

## 2020-07-25 DIAGNOSIS — R6521 Severe sepsis with septic shock: Secondary | ICD-10-CM | POA: Diagnosis not present

## 2020-07-25 DIAGNOSIS — A0472 Enterocolitis due to Clostridium difficile, not specified as recurrent: Secondary | ICD-10-CM | POA: Diagnosis not present

## 2020-07-26 DIAGNOSIS — A0472 Enterocolitis due to Clostridium difficile, not specified as recurrent: Secondary | ICD-10-CM | POA: Diagnosis not present

## 2020-07-26 DIAGNOSIS — G894 Chronic pain syndrome: Secondary | ICD-10-CM | POA: Diagnosis not present

## 2020-07-26 DIAGNOSIS — R6521 Severe sepsis with septic shock: Secondary | ICD-10-CM | POA: Diagnosis not present

## 2020-07-26 DIAGNOSIS — A414 Sepsis due to anaerobes: Secondary | ICD-10-CM | POA: Diagnosis not present

## 2020-07-26 DIAGNOSIS — I517 Cardiomegaly: Secondary | ICD-10-CM | POA: Diagnosis not present

## 2020-07-26 DIAGNOSIS — N179 Acute kidney failure, unspecified: Secondary | ICD-10-CM | POA: Diagnosis not present

## 2020-07-26 DIAGNOSIS — J90 Pleural effusion, not elsewhere classified: Secondary | ICD-10-CM | POA: Diagnosis not present

## 2020-07-26 DIAGNOSIS — R918 Other nonspecific abnormal finding of lung field: Secondary | ICD-10-CM | POA: Diagnosis not present

## 2020-07-26 DIAGNOSIS — Z452 Encounter for adjustment and management of vascular access device: Secondary | ICD-10-CM | POA: Diagnosis not present

## 2020-07-26 DIAGNOSIS — N3001 Acute cystitis with hematuria: Secondary | ICD-10-CM | POA: Diagnosis not present

## 2020-07-26 DIAGNOSIS — I482 Chronic atrial fibrillation, unspecified: Secondary | ICD-10-CM | POA: Diagnosis not present

## 2020-07-26 DIAGNOSIS — I5020 Unspecified systolic (congestive) heart failure: Secondary | ICD-10-CM | POA: Diagnosis not present

## 2020-07-27 DIAGNOSIS — R188 Other ascites: Secondary | ICD-10-CM | POA: Diagnosis not present

## 2020-07-27 DIAGNOSIS — N3001 Acute cystitis with hematuria: Secondary | ICD-10-CM | POA: Diagnosis not present

## 2020-07-27 DIAGNOSIS — I5020 Unspecified systolic (congestive) heart failure: Secondary | ICD-10-CM | POA: Diagnosis not present

## 2020-07-27 DIAGNOSIS — I482 Chronic atrial fibrillation, unspecified: Secondary | ICD-10-CM | POA: Diagnosis not present

## 2020-07-27 DIAGNOSIS — G894 Chronic pain syndrome: Secondary | ICD-10-CM | POA: Diagnosis not present

## 2020-07-27 DIAGNOSIS — K5900 Constipation, unspecified: Secondary | ICD-10-CM | POA: Diagnosis not present

## 2020-07-27 DIAGNOSIS — K529 Noninfective gastroenteritis and colitis, unspecified: Secondary | ICD-10-CM | POA: Diagnosis not present

## 2020-07-27 DIAGNOSIS — A4151 Sepsis due to Escherichia coli [E. coli]: Secondary | ICD-10-CM | POA: Diagnosis not present

## 2020-07-27 DIAGNOSIS — N179 Acute kidney failure, unspecified: Secondary | ICD-10-CM | POA: Diagnosis not present

## 2020-07-27 DIAGNOSIS — R6521 Severe sepsis with septic shock: Secondary | ICD-10-CM | POA: Diagnosis not present

## 2020-07-27 DIAGNOSIS — A0472 Enterocolitis due to Clostridium difficile, not specified as recurrent: Secondary | ICD-10-CM | POA: Diagnosis not present

## 2020-07-28 DIAGNOSIS — N179 Acute kidney failure, unspecified: Secondary | ICD-10-CM | POA: Diagnosis not present

## 2020-07-28 DIAGNOSIS — I5020 Unspecified systolic (congestive) heart failure: Secondary | ICD-10-CM | POA: Diagnosis not present

## 2020-07-28 DIAGNOSIS — K529 Noninfective gastroenteritis and colitis, unspecified: Secondary | ICD-10-CM | POA: Diagnosis not present

## 2020-07-28 DIAGNOSIS — K5909 Other constipation: Secondary | ICD-10-CM | POA: Diagnosis not present

## 2020-07-28 DIAGNOSIS — A0472 Enterocolitis due to Clostridium difficile, not specified as recurrent: Secondary | ICD-10-CM | POA: Diagnosis not present

## 2020-07-28 DIAGNOSIS — N3001 Acute cystitis with hematuria: Secondary | ICD-10-CM | POA: Diagnosis not present

## 2020-07-28 DIAGNOSIS — A4151 Sepsis due to Escherichia coli [E. coli]: Secondary | ICD-10-CM | POA: Diagnosis not present

## 2020-07-28 DIAGNOSIS — I482 Chronic atrial fibrillation, unspecified: Secondary | ICD-10-CM | POA: Diagnosis not present

## 2020-07-28 DIAGNOSIS — R6521 Severe sepsis with septic shock: Secondary | ICD-10-CM | POA: Diagnosis not present

## 2020-07-28 DIAGNOSIS — G894 Chronic pain syndrome: Secondary | ICD-10-CM | POA: Diagnosis not present

## 2020-07-29 DIAGNOSIS — G894 Chronic pain syndrome: Secondary | ICD-10-CM | POA: Diagnosis not present

## 2020-07-29 DIAGNOSIS — K5909 Other constipation: Secondary | ICD-10-CM | POA: Diagnosis not present

## 2020-07-29 DIAGNOSIS — I5020 Unspecified systolic (congestive) heart failure: Secondary | ICD-10-CM | POA: Diagnosis not present

## 2020-07-29 DIAGNOSIS — A4151 Sepsis due to Escherichia coli [E. coli]: Secondary | ICD-10-CM | POA: Diagnosis not present

## 2020-07-29 DIAGNOSIS — N3001 Acute cystitis with hematuria: Secondary | ICD-10-CM | POA: Diagnosis not present

## 2020-07-29 DIAGNOSIS — N179 Acute kidney failure, unspecified: Secondary | ICD-10-CM | POA: Diagnosis not present

## 2020-07-29 DIAGNOSIS — R6521 Severe sepsis with septic shock: Secondary | ICD-10-CM | POA: Diagnosis not present

## 2020-07-29 DIAGNOSIS — I482 Chronic atrial fibrillation, unspecified: Secondary | ICD-10-CM | POA: Diagnosis not present

## 2020-07-29 DIAGNOSIS — A0472 Enterocolitis due to Clostridium difficile, not specified as recurrent: Secondary | ICD-10-CM | POA: Diagnosis not present

## 2020-07-29 DIAGNOSIS — K529 Noninfective gastroenteritis and colitis, unspecified: Secondary | ICD-10-CM | POA: Diagnosis not present

## 2020-07-29 DIAGNOSIS — Z9889 Other specified postprocedural states: Secondary | ICD-10-CM | POA: Diagnosis not present

## 2020-07-30 DIAGNOSIS — A4151 Sepsis due to Escherichia coli [E. coli]: Secondary | ICD-10-CM | POA: Diagnosis not present

## 2020-07-30 DIAGNOSIS — A0472 Enterocolitis due to Clostridium difficile, not specified as recurrent: Secondary | ICD-10-CM | POA: Diagnosis not present

## 2020-07-30 DIAGNOSIS — I5020 Unspecified systolic (congestive) heart failure: Secondary | ICD-10-CM | POA: Diagnosis not present

## 2020-07-30 DIAGNOSIS — K6389 Other specified diseases of intestine: Secondary | ICD-10-CM | POA: Diagnosis not present

## 2020-07-30 DIAGNOSIS — I482 Chronic atrial fibrillation, unspecified: Secondary | ICD-10-CM | POA: Diagnosis not present

## 2020-07-30 DIAGNOSIS — N3001 Acute cystitis with hematuria: Secondary | ICD-10-CM | POA: Diagnosis not present

## 2020-07-30 DIAGNOSIS — K529 Noninfective gastroenteritis and colitis, unspecified: Secondary | ICD-10-CM | POA: Diagnosis not present

## 2020-07-30 DIAGNOSIS — R6521 Severe sepsis with septic shock: Secondary | ICD-10-CM | POA: Diagnosis not present

## 2020-07-30 DIAGNOSIS — G894 Chronic pain syndrome: Secondary | ICD-10-CM | POA: Diagnosis not present

## 2020-07-30 DIAGNOSIS — N179 Acute kidney failure, unspecified: Secondary | ICD-10-CM | POA: Diagnosis not present

## 2020-07-31 DIAGNOSIS — K529 Noninfective gastroenteritis and colitis, unspecified: Secondary | ICD-10-CM | POA: Diagnosis not present

## 2020-07-31 DIAGNOSIS — R188 Other ascites: Secondary | ICD-10-CM | POA: Diagnosis not present

## 2020-07-31 DIAGNOSIS — N3001 Acute cystitis with hematuria: Secondary | ICD-10-CM | POA: Diagnosis not present

## 2020-07-31 DIAGNOSIS — K5939 Other megacolon: Secondary | ICD-10-CM | POA: Diagnosis not present

## 2020-07-31 DIAGNOSIS — I5020 Unspecified systolic (congestive) heart failure: Secondary | ICD-10-CM | POA: Diagnosis not present

## 2020-07-31 DIAGNOSIS — G894 Chronic pain syndrome: Secondary | ICD-10-CM | POA: Diagnosis not present

## 2020-07-31 DIAGNOSIS — A0472 Enterocolitis due to Clostridium difficile, not specified as recurrent: Secondary | ICD-10-CM | POA: Diagnosis not present

## 2020-07-31 DIAGNOSIS — N200 Calculus of kidney: Secondary | ICD-10-CM | POA: Diagnosis not present

## 2020-07-31 DIAGNOSIS — A4151 Sepsis due to Escherichia coli [E. coli]: Secondary | ICD-10-CM | POA: Diagnosis not present

## 2020-07-31 DIAGNOSIS — R6521 Severe sepsis with septic shock: Secondary | ICD-10-CM | POA: Diagnosis not present

## 2020-07-31 DIAGNOSIS — I482 Chronic atrial fibrillation, unspecified: Secondary | ICD-10-CM | POA: Diagnosis not present

## 2020-07-31 DIAGNOSIS — K6389 Other specified diseases of intestine: Secondary | ICD-10-CM | POA: Diagnosis not present

## 2020-07-31 DIAGNOSIS — N179 Acute kidney failure, unspecified: Secondary | ICD-10-CM | POA: Diagnosis not present

## 2020-08-01 DIAGNOSIS — N179 Acute kidney failure, unspecified: Secondary | ICD-10-CM | POA: Diagnosis not present

## 2020-08-01 DIAGNOSIS — A0472 Enterocolitis due to Clostridium difficile, not specified as recurrent: Secondary | ICD-10-CM | POA: Diagnosis not present

## 2020-08-01 DIAGNOSIS — N3001 Acute cystitis with hematuria: Secondary | ICD-10-CM | POA: Diagnosis not present

## 2020-08-01 DIAGNOSIS — I5020 Unspecified systolic (congestive) heart failure: Secondary | ICD-10-CM | POA: Diagnosis not present

## 2020-08-01 DIAGNOSIS — I482 Chronic atrial fibrillation, unspecified: Secondary | ICD-10-CM | POA: Diagnosis not present

## 2020-08-01 DIAGNOSIS — A419 Sepsis, unspecified organism: Secondary | ICD-10-CM | POA: Diagnosis not present

## 2020-08-01 DIAGNOSIS — R6521 Severe sepsis with septic shock: Secondary | ICD-10-CM | POA: Diagnosis not present

## 2020-08-01 DIAGNOSIS — K625 Hemorrhage of anus and rectum: Secondary | ICD-10-CM | POA: Diagnosis not present

## 2020-08-01 DIAGNOSIS — G894 Chronic pain syndrome: Secondary | ICD-10-CM | POA: Diagnosis not present

## 2020-08-02 DIAGNOSIS — K625 Hemorrhage of anus and rectum: Secondary | ICD-10-CM | POA: Diagnosis not present

## 2020-08-02 DIAGNOSIS — A419 Sepsis, unspecified organism: Secondary | ICD-10-CM | POA: Diagnosis not present

## 2020-08-03 DIAGNOSIS — K625 Hemorrhage of anus and rectum: Secondary | ICD-10-CM | POA: Diagnosis not present

## 2020-08-03 DIAGNOSIS — M25462 Effusion, left knee: Secondary | ICD-10-CM | POA: Diagnosis not present

## 2020-08-03 DIAGNOSIS — S82141A Displaced bicondylar fracture of right tibia, initial encounter for closed fracture: Secondary | ICD-10-CM | POA: Diagnosis not present

## 2020-08-03 DIAGNOSIS — A419 Sepsis, unspecified organism: Secondary | ICD-10-CM | POA: Diagnosis not present

## 2020-08-04 DIAGNOSIS — J90 Pleural effusion, not elsewhere classified: Secondary | ICD-10-CM | POA: Diagnosis not present

## 2020-08-04 DIAGNOSIS — I517 Cardiomegaly: Secondary | ICD-10-CM | POA: Diagnosis not present

## 2020-08-04 DIAGNOSIS — R918 Other nonspecific abnormal finding of lung field: Secondary | ICD-10-CM | POA: Diagnosis not present

## 2020-08-04 DIAGNOSIS — K5909 Other constipation: Secondary | ICD-10-CM | POA: Diagnosis not present

## 2020-08-04 DIAGNOSIS — K529 Noninfective gastroenteritis and colitis, unspecified: Secondary | ICD-10-CM | POA: Diagnosis not present

## 2020-08-04 DIAGNOSIS — A419 Sepsis, unspecified organism: Secondary | ICD-10-CM | POA: Diagnosis not present

## 2020-08-04 DIAGNOSIS — R059 Cough, unspecified: Secondary | ICD-10-CM | POA: Diagnosis not present

## 2020-08-05 DIAGNOSIS — K529 Noninfective gastroenteritis and colitis, unspecified: Secondary | ICD-10-CM | POA: Diagnosis not present

## 2020-08-05 DIAGNOSIS — K625 Hemorrhage of anus and rectum: Secondary | ICD-10-CM | POA: Diagnosis not present

## 2020-08-05 DIAGNOSIS — A419 Sepsis, unspecified organism: Secondary | ICD-10-CM | POA: Diagnosis not present

## 2020-08-05 DIAGNOSIS — A0472 Enterocolitis due to Clostridium difficile, not specified as recurrent: Secondary | ICD-10-CM | POA: Diagnosis not present

## 2020-08-06 DIAGNOSIS — A0472 Enterocolitis due to Clostridium difficile, not specified as recurrent: Secondary | ICD-10-CM | POA: Diagnosis not present

## 2020-08-06 DIAGNOSIS — A419 Sepsis, unspecified organism: Secondary | ICD-10-CM | POA: Diagnosis not present

## 2020-08-07 DIAGNOSIS — A414 Sepsis due to anaerobes: Secondary | ICD-10-CM | POA: Diagnosis not present

## 2020-08-07 DIAGNOSIS — A0472 Enterocolitis due to Clostridium difficile, not specified as recurrent: Secondary | ICD-10-CM | POA: Diagnosis not present

## 2020-08-08 DIAGNOSIS — K529 Noninfective gastroenteritis and colitis, unspecified: Secondary | ICD-10-CM | POA: Diagnosis not present

## 2020-08-08 DIAGNOSIS — A414 Sepsis due to anaerobes: Secondary | ICD-10-CM | POA: Diagnosis not present

## 2020-08-08 DIAGNOSIS — N179 Acute kidney failure, unspecified: Secondary | ICD-10-CM | POA: Diagnosis not present

## 2020-08-08 DIAGNOSIS — A0472 Enterocolitis due to Clostridium difficile, not specified as recurrent: Secondary | ICD-10-CM | POA: Diagnosis not present

## 2020-08-08 DIAGNOSIS — A419 Sepsis, unspecified organism: Secondary | ICD-10-CM | POA: Diagnosis not present

## 2020-08-09 DIAGNOSIS — A0472 Enterocolitis due to Clostridium difficile, not specified as recurrent: Secondary | ICD-10-CM | POA: Diagnosis not present

## 2020-08-09 DIAGNOSIS — A419 Sepsis, unspecified organism: Secondary | ICD-10-CM | POA: Diagnosis not present

## 2020-08-09 DIAGNOSIS — N179 Acute kidney failure, unspecified: Secondary | ICD-10-CM | POA: Diagnosis not present

## 2020-08-09 DIAGNOSIS — I482 Chronic atrial fibrillation, unspecified: Secondary | ICD-10-CM | POA: Diagnosis not present

## 2020-08-09 DIAGNOSIS — I5020 Unspecified systolic (congestive) heart failure: Secondary | ICD-10-CM | POA: Diagnosis not present

## 2020-08-09 DIAGNOSIS — G894 Chronic pain syndrome: Secondary | ICD-10-CM | POA: Diagnosis not present

## 2020-08-09 DIAGNOSIS — N3001 Acute cystitis with hematuria: Secondary | ICD-10-CM | POA: Diagnosis not present

## 2020-08-09 DIAGNOSIS — R6521 Severe sepsis with septic shock: Secondary | ICD-10-CM | POA: Diagnosis not present

## 2020-08-09 DIAGNOSIS — K529 Noninfective gastroenteritis and colitis, unspecified: Secondary | ICD-10-CM | POA: Diagnosis not present

## 2020-08-10 DIAGNOSIS — A4151 Sepsis due to Escherichia coli [E. coli]: Secondary | ICD-10-CM | POA: Diagnosis not present

## 2020-08-10 DIAGNOSIS — I482 Chronic atrial fibrillation, unspecified: Secondary | ICD-10-CM | POA: Diagnosis not present

## 2020-08-10 DIAGNOSIS — N179 Acute kidney failure, unspecified: Secondary | ICD-10-CM | POA: Diagnosis not present

## 2020-08-10 DIAGNOSIS — R6521 Severe sepsis with septic shock: Secondary | ICD-10-CM | POA: Diagnosis not present

## 2020-08-10 DIAGNOSIS — I5020 Unspecified systolic (congestive) heart failure: Secondary | ICD-10-CM | POA: Diagnosis not present

## 2020-08-10 DIAGNOSIS — G894 Chronic pain syndrome: Secondary | ICD-10-CM | POA: Diagnosis not present

## 2020-08-10 DIAGNOSIS — N3001 Acute cystitis with hematuria: Secondary | ICD-10-CM | POA: Diagnosis not present

## 2020-08-10 DIAGNOSIS — A0472 Enterocolitis due to Clostridium difficile, not specified as recurrent: Secondary | ICD-10-CM | POA: Diagnosis not present

## 2020-08-10 DIAGNOSIS — K529 Noninfective gastroenteritis and colitis, unspecified: Secondary | ICD-10-CM | POA: Diagnosis not present

## 2020-08-11 DIAGNOSIS — R6521 Severe sepsis with septic shock: Secondary | ICD-10-CM | POA: Diagnosis not present

## 2020-08-11 DIAGNOSIS — A4151 Sepsis due to Escherichia coli [E. coli]: Secondary | ICD-10-CM | POA: Diagnosis not present

## 2020-08-11 DIAGNOSIS — I482 Chronic atrial fibrillation, unspecified: Secondary | ICD-10-CM | POA: Diagnosis not present

## 2020-08-11 DIAGNOSIS — A419 Sepsis, unspecified organism: Secondary | ICD-10-CM | POA: Diagnosis not present

## 2020-08-11 DIAGNOSIS — I5020 Unspecified systolic (congestive) heart failure: Secondary | ICD-10-CM | POA: Diagnosis not present

## 2020-08-11 DIAGNOSIS — G894 Chronic pain syndrome: Secondary | ICD-10-CM | POA: Diagnosis not present

## 2020-08-11 DIAGNOSIS — K529 Noninfective gastroenteritis and colitis, unspecified: Secondary | ICD-10-CM | POA: Diagnosis not present

## 2020-08-11 DIAGNOSIS — A0472 Enterocolitis due to Clostridium difficile, not specified as recurrent: Secondary | ICD-10-CM | POA: Diagnosis not present

## 2020-08-11 DIAGNOSIS — N3001 Acute cystitis with hematuria: Secondary | ICD-10-CM | POA: Diagnosis not present

## 2020-08-11 DIAGNOSIS — N179 Acute kidney failure, unspecified: Secondary | ICD-10-CM | POA: Diagnosis not present

## 2020-08-12 DIAGNOSIS — K219 Gastro-esophageal reflux disease without esophagitis: Secondary | ICD-10-CM | POA: Diagnosis not present

## 2020-08-12 DIAGNOSIS — K529 Noninfective gastroenteritis and colitis, unspecified: Secondary | ICD-10-CM | POA: Diagnosis not present

## 2020-08-12 DIAGNOSIS — N179 Acute kidney failure, unspecified: Secondary | ICD-10-CM | POA: Diagnosis not present

## 2020-08-12 DIAGNOSIS — A0472 Enterocolitis due to Clostridium difficile, not specified as recurrent: Secondary | ICD-10-CM | POA: Diagnosis not present

## 2020-08-12 DIAGNOSIS — I272 Pulmonary hypertension, unspecified: Secondary | ICD-10-CM | POA: Diagnosis not present

## 2020-08-12 DIAGNOSIS — G894 Chronic pain syndrome: Secondary | ICD-10-CM | POA: Diagnosis not present

## 2020-08-12 DIAGNOSIS — I5020 Unspecified systolic (congestive) heart failure: Secondary | ICD-10-CM | POA: Diagnosis not present

## 2020-08-12 DIAGNOSIS — N3001 Acute cystitis with hematuria: Secondary | ICD-10-CM | POA: Diagnosis not present

## 2020-08-12 DIAGNOSIS — I252 Old myocardial infarction: Secondary | ICD-10-CM | POA: Diagnosis not present

## 2020-08-12 DIAGNOSIS — R6521 Severe sepsis with septic shock: Secondary | ICD-10-CM | POA: Diagnosis not present

## 2020-08-12 DIAGNOSIS — A4151 Sepsis due to Escherichia coli [E. coli]: Secondary | ICD-10-CM | POA: Diagnosis not present

## 2020-08-12 DIAGNOSIS — N183 Chronic kidney disease, stage 3 unspecified: Secondary | ICD-10-CM | POA: Diagnosis not present

## 2020-08-12 DIAGNOSIS — J45909 Unspecified asthma, uncomplicated: Secondary | ICD-10-CM | POA: Diagnosis not present

## 2020-08-12 DIAGNOSIS — I482 Chronic atrial fibrillation, unspecified: Secondary | ICD-10-CM | POA: Diagnosis not present

## 2020-08-13 DIAGNOSIS — K649 Unspecified hemorrhoids: Secondary | ICD-10-CM | POA: Diagnosis not present

## 2020-08-13 DIAGNOSIS — A4151 Sepsis due to Escherichia coli [E. coli]: Secondary | ICD-10-CM | POA: Diagnosis not present

## 2020-08-13 DIAGNOSIS — K625 Hemorrhage of anus and rectum: Secondary | ICD-10-CM | POA: Diagnosis not present

## 2020-08-13 DIAGNOSIS — N179 Acute kidney failure, unspecified: Secondary | ICD-10-CM | POA: Diagnosis not present

## 2020-08-13 DIAGNOSIS — A419 Sepsis, unspecified organism: Secondary | ICD-10-CM | POA: Diagnosis not present

## 2020-08-13 DIAGNOSIS — I5020 Unspecified systolic (congestive) heart failure: Secondary | ICD-10-CM | POA: Diagnosis not present

## 2020-08-13 DIAGNOSIS — G894 Chronic pain syndrome: Secondary | ICD-10-CM | POA: Diagnosis not present

## 2020-08-13 DIAGNOSIS — N3001 Acute cystitis with hematuria: Secondary | ICD-10-CM | POA: Diagnosis not present

## 2020-08-13 DIAGNOSIS — I482 Chronic atrial fibrillation, unspecified: Secondary | ICD-10-CM | POA: Diagnosis not present

## 2020-08-13 DIAGNOSIS — R6521 Severe sepsis with septic shock: Secondary | ICD-10-CM | POA: Diagnosis not present

## 2020-08-13 DIAGNOSIS — R197 Diarrhea, unspecified: Secondary | ICD-10-CM | POA: Diagnosis not present

## 2020-08-13 DIAGNOSIS — A0472 Enterocolitis due to Clostridium difficile, not specified as recurrent: Secondary | ICD-10-CM | POA: Diagnosis not present

## 2020-08-14 DIAGNOSIS — A0472 Enterocolitis due to Clostridium difficile, not specified as recurrent: Secondary | ICD-10-CM | POA: Diagnosis not present

## 2020-08-14 DIAGNOSIS — N3001 Acute cystitis with hematuria: Secondary | ICD-10-CM | POA: Diagnosis not present

## 2020-08-14 DIAGNOSIS — N179 Acute kidney failure, unspecified: Secondary | ICD-10-CM | POA: Diagnosis not present

## 2020-08-14 DIAGNOSIS — A419 Sepsis, unspecified organism: Secondary | ICD-10-CM | POA: Diagnosis not present

## 2020-08-14 DIAGNOSIS — R6521 Severe sepsis with septic shock: Secondary | ICD-10-CM | POA: Diagnosis not present

## 2020-08-14 DIAGNOSIS — I482 Chronic atrial fibrillation, unspecified: Secondary | ICD-10-CM | POA: Diagnosis not present

## 2020-08-14 DIAGNOSIS — A4151 Sepsis due to Escherichia coli [E. coli]: Secondary | ICD-10-CM | POA: Diagnosis not present

## 2020-08-14 DIAGNOSIS — G894 Chronic pain syndrome: Secondary | ICD-10-CM | POA: Diagnosis not present

## 2020-08-14 DIAGNOSIS — I5020 Unspecified systolic (congestive) heart failure: Secondary | ICD-10-CM | POA: Diagnosis not present

## 2020-08-15 DIAGNOSIS — N179 Acute kidney failure, unspecified: Secondary | ICD-10-CM | POA: Diagnosis not present

## 2020-08-15 DIAGNOSIS — N3001 Acute cystitis with hematuria: Secondary | ICD-10-CM | POA: Diagnosis not present

## 2020-08-15 DIAGNOSIS — K5909 Other constipation: Secondary | ICD-10-CM | POA: Diagnosis not present

## 2020-08-15 DIAGNOSIS — K5900 Constipation, unspecified: Secondary | ICD-10-CM | POA: Diagnosis not present

## 2020-08-15 DIAGNOSIS — I5020 Unspecified systolic (congestive) heart failure: Secondary | ICD-10-CM | POA: Diagnosis not present

## 2020-08-15 DIAGNOSIS — R6521 Severe sepsis with septic shock: Secondary | ICD-10-CM | POA: Diagnosis not present

## 2020-08-15 DIAGNOSIS — G894 Chronic pain syndrome: Secondary | ICD-10-CM | POA: Diagnosis not present

## 2020-08-15 DIAGNOSIS — K529 Noninfective gastroenteritis and colitis, unspecified: Secondary | ICD-10-CM | POA: Diagnosis not present

## 2020-08-15 DIAGNOSIS — A4151 Sepsis due to Escherichia coli [E. coli]: Secondary | ICD-10-CM | POA: Diagnosis not present

## 2020-08-15 DIAGNOSIS — A0472 Enterocolitis due to Clostridium difficile, not specified as recurrent: Secondary | ICD-10-CM | POA: Diagnosis not present

## 2020-08-15 DIAGNOSIS — I482 Chronic atrial fibrillation, unspecified: Secondary | ICD-10-CM | POA: Diagnosis not present

## 2020-08-16 DIAGNOSIS — N184 Chronic kidney disease, stage 4 (severe): Secondary | ICD-10-CM | POA: Diagnosis not present

## 2020-08-16 DIAGNOSIS — J45909 Unspecified asthma, uncomplicated: Secondary | ICD-10-CM | POA: Diagnosis not present

## 2020-08-16 DIAGNOSIS — A4151 Sepsis due to Escherichia coli [E. coli]: Secondary | ICD-10-CM | POA: Diagnosis not present

## 2020-08-16 DIAGNOSIS — K921 Melena: Secondary | ICD-10-CM | POA: Diagnosis not present

## 2020-08-16 DIAGNOSIS — I130 Hypertensive heart and chronic kidney disease with heart failure and stage 1 through stage 4 chronic kidney disease, or unspecified chronic kidney disease: Secondary | ICD-10-CM | POA: Diagnosis not present

## 2020-08-16 DIAGNOSIS — R197 Diarrhea, unspecified: Secondary | ICD-10-CM | POA: Diagnosis not present

## 2020-08-16 DIAGNOSIS — K625 Hemorrhage of anus and rectum: Secondary | ICD-10-CM | POA: Diagnosis not present

## 2020-08-16 DIAGNOSIS — I5020 Unspecified systolic (congestive) heart failure: Secondary | ICD-10-CM | POA: Diagnosis not present

## 2020-08-16 DIAGNOSIS — K649 Unspecified hemorrhoids: Secondary | ICD-10-CM | POA: Diagnosis not present

## 2020-08-16 DIAGNOSIS — I2510 Atherosclerotic heart disease of native coronary artery without angina pectoris: Secondary | ICD-10-CM | POA: Diagnosis not present

## 2020-08-17 DIAGNOSIS — K529 Noninfective gastroenteritis and colitis, unspecified: Secondary | ICD-10-CM | POA: Diagnosis not present

## 2020-08-17 DIAGNOSIS — I482 Chronic atrial fibrillation, unspecified: Secondary | ICD-10-CM | POA: Diagnosis not present

## 2020-08-17 DIAGNOSIS — K5909 Other constipation: Secondary | ICD-10-CM | POA: Diagnosis not present

## 2020-08-18 DIAGNOSIS — I482 Chronic atrial fibrillation, unspecified: Secondary | ICD-10-CM | POA: Diagnosis not present

## 2020-08-19 DIAGNOSIS — I482 Chronic atrial fibrillation, unspecified: Secondary | ICD-10-CM | POA: Diagnosis not present

## 2020-08-20 DIAGNOSIS — I482 Chronic atrial fibrillation, unspecified: Secondary | ICD-10-CM | POA: Diagnosis not present

## 2020-08-20 DIAGNOSIS — R918 Other nonspecific abnormal finding of lung field: Secondary | ICD-10-CM | POA: Diagnosis not present

## 2020-08-20 DIAGNOSIS — J90 Pleural effusion, not elsewhere classified: Secondary | ICD-10-CM | POA: Diagnosis not present

## 2020-08-20 DIAGNOSIS — R1032 Left lower quadrant pain: Secondary | ICD-10-CM | POA: Diagnosis not present

## 2020-08-21 DIAGNOSIS — I482 Chronic atrial fibrillation, unspecified: Secondary | ICD-10-CM | POA: Diagnosis not present

## 2020-08-22 ENCOUNTER — Ambulatory Visit: Payer: Medicare HMO | Admitting: Family Medicine

## 2020-08-22 DIAGNOSIS — I482 Chronic atrial fibrillation, unspecified: Secondary | ICD-10-CM | POA: Diagnosis not present

## 2020-08-23 DIAGNOSIS — I482 Chronic atrial fibrillation, unspecified: Secondary | ICD-10-CM | POA: Diagnosis not present

## 2020-08-24 DIAGNOSIS — K5909 Other constipation: Secondary | ICD-10-CM | POA: Diagnosis not present

## 2020-08-24 DIAGNOSIS — E440 Moderate protein-calorie malnutrition: Secondary | ICD-10-CM | POA: Diagnosis not present

## 2020-08-24 DIAGNOSIS — D638 Anemia in other chronic diseases classified elsewhere: Secondary | ICD-10-CM | POA: Diagnosis not present

## 2020-08-24 DIAGNOSIS — R197 Diarrhea, unspecified: Secondary | ICD-10-CM | POA: Diagnosis not present

## 2020-08-24 DIAGNOSIS — I2510 Atherosclerotic heart disease of native coronary artery without angina pectoris: Secondary | ICD-10-CM | POA: Diagnosis not present

## 2020-08-24 DIAGNOSIS — I130 Hypertensive heart and chronic kidney disease with heart failure and stage 1 through stage 4 chronic kidney disease, or unspecified chronic kidney disease: Secondary | ICD-10-CM | POA: Diagnosis not present

## 2020-08-24 DIAGNOSIS — I25118 Atherosclerotic heart disease of native coronary artery with other forms of angina pectoris: Secondary | ICD-10-CM | POA: Diagnosis not present

## 2020-08-24 DIAGNOSIS — K648 Other hemorrhoids: Secondary | ICD-10-CM | POA: Diagnosis not present

## 2020-08-24 DIAGNOSIS — N1832 Chronic kidney disease, stage 3b: Secondary | ICD-10-CM | POA: Diagnosis not present

## 2020-08-24 DIAGNOSIS — I4811 Longstanding persistent atrial fibrillation: Secondary | ICD-10-CM | POA: Diagnosis not present

## 2020-08-24 DIAGNOSIS — I482 Chronic atrial fibrillation, unspecified: Secondary | ICD-10-CM | POA: Diagnosis not present

## 2020-08-24 DIAGNOSIS — A048 Other specified bacterial intestinal infections: Secondary | ICD-10-CM | POA: Diagnosis not present

## 2020-08-24 DIAGNOSIS — S82141A Displaced bicondylar fracture of right tibia, initial encounter for closed fracture: Secondary | ICD-10-CM | POA: Diagnosis not present

## 2020-08-24 DIAGNOSIS — N184 Chronic kidney disease, stage 4 (severe): Secondary | ICD-10-CM | POA: Diagnosis not present

## 2020-08-24 DIAGNOSIS — A0472 Enterocolitis due to Clostridium difficile, not specified as recurrent: Secondary | ICD-10-CM | POA: Diagnosis not present

## 2020-08-24 DIAGNOSIS — Z79899 Other long term (current) drug therapy: Secondary | ICD-10-CM | POA: Diagnosis not present

## 2020-08-24 DIAGNOSIS — K9289 Other specified diseases of the digestive system: Secondary | ICD-10-CM | POA: Diagnosis not present

## 2020-08-24 DIAGNOSIS — K649 Unspecified hemorrhoids: Secondary | ICD-10-CM | POA: Diagnosis not present

## 2020-08-24 DIAGNOSIS — G8929 Other chronic pain: Secondary | ICD-10-CM | POA: Diagnosis not present

## 2020-08-24 DIAGNOSIS — R634 Abnormal weight loss: Secondary | ICD-10-CM | POA: Diagnosis not present

## 2020-08-24 DIAGNOSIS — I252 Old myocardial infarction: Secondary | ICD-10-CM | POA: Diagnosis not present

## 2020-08-24 DIAGNOSIS — R5381 Other malaise: Secondary | ICD-10-CM | POA: Diagnosis not present

## 2020-08-24 DIAGNOSIS — M25462 Effusion, left knee: Secondary | ICD-10-CM | POA: Diagnosis not present

## 2020-08-24 DIAGNOSIS — I5030 Unspecified diastolic (congestive) heart failure: Secondary | ICD-10-CM | POA: Diagnosis not present

## 2020-08-24 DIAGNOSIS — I5020 Unspecified systolic (congestive) heart failure: Secondary | ICD-10-CM | POA: Diagnosis not present

## 2020-08-24 DIAGNOSIS — J45909 Unspecified asthma, uncomplicated: Secondary | ICD-10-CM | POA: Diagnosis not present

## 2020-08-25 DIAGNOSIS — Z79899 Other long term (current) drug therapy: Secondary | ICD-10-CM | POA: Diagnosis not present

## 2020-08-30 DIAGNOSIS — N1832 Chronic kidney disease, stage 3b: Secondary | ICD-10-CM | POA: Diagnosis not present

## 2020-08-30 DIAGNOSIS — I4811 Longstanding persistent atrial fibrillation: Secondary | ICD-10-CM | POA: Diagnosis not present

## 2020-09-01 DIAGNOSIS — R634 Abnormal weight loss: Secondary | ICD-10-CM | POA: Diagnosis not present

## 2020-09-13 DIAGNOSIS — I5030 Unspecified diastolic (congestive) heart failure: Secondary | ICD-10-CM | POA: Diagnosis not present

## 2020-09-13 DIAGNOSIS — R5381 Other malaise: Secondary | ICD-10-CM | POA: Diagnosis not present

## 2020-09-13 DIAGNOSIS — D638 Anemia in other chronic diseases classified elsewhere: Secondary | ICD-10-CM | POA: Diagnosis not present

## 2020-09-13 DIAGNOSIS — G8929 Other chronic pain: Secondary | ICD-10-CM | POA: Diagnosis not present

## 2020-09-13 DIAGNOSIS — N1832 Chronic kidney disease, stage 3b: Secondary | ICD-10-CM | POA: Diagnosis not present

## 2020-09-13 DIAGNOSIS — K649 Unspecified hemorrhoids: Secondary | ICD-10-CM | POA: Diagnosis not present

## 2020-09-13 DIAGNOSIS — A0472 Enterocolitis due to Clostridium difficile, not specified as recurrent: Secondary | ICD-10-CM | POA: Diagnosis not present

## 2020-09-13 DIAGNOSIS — I4811 Longstanding persistent atrial fibrillation: Secondary | ICD-10-CM | POA: Diagnosis not present

## 2020-09-15 ENCOUNTER — Telehealth: Payer: Self-pay | Admitting: Family Medicine

## 2020-09-15 ENCOUNTER — Telehealth: Payer: Self-pay

## 2020-09-15 NOTE — Telephone Encounter (Signed)
Changed patient's discharge appointment to 09/21/20 along with her normal follow up

## 2020-09-15 NOTE — Telephone Encounter (Signed)
Transition Care Management Unsuccessful Follow-up Telephone Call  Date of discharge and from where: [facility]  Diagnosis:  rehab after hosp admission for sepsis secondary to c.diff   Attempts:  1st Attempt  Reason for unsuccessful TCM follow-up call:  Left voice message

## 2020-09-16 ENCOUNTER — Ambulatory Visit: Payer: Medicare HMO | Admitting: Family Medicine

## 2020-09-21 ENCOUNTER — Ambulatory Visit: Payer: Medicare HMO | Admitting: Family Medicine

## 2020-09-21 NOTE — Telephone Encounter (Addendum)
Transition Care Management Unsuccessful Follow-up Telephone Call  Date of discharge and from where:  [facility]  Diagnosis:  Rehab after hospital stay for sepsis secondary to c.dif   Attempts:  2nd Attempt  Reason for unsuccessful TCM follow-up call:  Left voice message

## 2020-09-27 ENCOUNTER — Telehealth: Payer: Self-pay | Admitting: Family Medicine

## 2020-09-27 NOTE — Telephone Encounter (Signed)
Spoke with patient and scheduled appointment for 09/29/20 at 2:55 pm with Dr. Livia Snellen.

## 2020-09-28 NOTE — Telephone Encounter (Signed)
Transition Care Management Unsuccessful Follow-up Telephone Call  Date of discharge and from where:  [facility]  Diagnosis:  Rehab after hosp stay for sepsis due to C.diff  Attempts:  3rd Attempt  Reason for unsuccessful TCM follow-up call:  Unable to reach patient

## 2020-09-29 ENCOUNTER — Encounter: Payer: Self-pay | Admitting: Family Medicine

## 2020-09-29 ENCOUNTER — Other Ambulatory Visit: Payer: Self-pay

## 2020-09-29 ENCOUNTER — Ambulatory Visit (INDEPENDENT_AMBULATORY_CARE_PROVIDER_SITE_OTHER): Payer: Medicare HMO | Admitting: Family Medicine

## 2020-09-29 VITALS — BP 102/62 | HR 108 | Temp 97.9°F | Resp 20 | Ht 61.0 in | Wt 124.0 lb

## 2020-09-29 DIAGNOSIS — K5903 Drug induced constipation: Secondary | ICD-10-CM

## 2020-09-29 DIAGNOSIS — B0229 Other postherpetic nervous system involvement: Secondary | ICD-10-CM | POA: Diagnosis not present

## 2020-09-29 DIAGNOSIS — A0472 Enterocolitis due to Clostridium difficile, not specified as recurrent: Secondary | ICD-10-CM | POA: Diagnosis not present

## 2020-09-29 DIAGNOSIS — I480 Paroxysmal atrial fibrillation: Secondary | ICD-10-CM

## 2020-09-29 MED ORDER — METHADONE HCL 10 MG PO TABS: 10.0000 mg | ORAL_TABLET | Freq: Three times a day (TID) | ORAL | 0 refills | Status: DC

## 2020-09-29 MED ORDER — POLYETHYLENE GLYCOL 3350 17 GM/SCOOP PO POWD: ORAL | 3 refills | Status: AC

## 2020-09-29 MED ORDER — METHADONE HCL 10 MG PO TABS: 10.0000 mg | ORAL_TABLET | Freq: Three times a day (TID) | ORAL | 0 refills | Status: AC

## 2020-09-29 NOTE — Progress Notes (Signed)
Subjective:  Patient ID: Sheena Morrow, female    DOB: 1933/12/14  Age: 87 y.o. MRN: [id number]  CC: Hospitalization Follow-up   HPI Sheena Morrow presents for bad diarrhea. Kept in the hospital for 33 days. Had two operations. Now constipated. MOM not helping.  Records from [place] show she had C. Dif.. Also treated for sepsis. Had multiple endoscopies including fecal microbiota transplant.   Depression screen [facility] 2/9 09/29/2020 07/01/2020 05/23/2020  Decreased Interest 2 0 0  Down, Depressed, Hopeless 1 0 0  PHQ - 2 Score 3 0 0  Altered sleeping 0 - -  Tired, decreased energy 3 - -  Change in appetite 1 - -  Feeling bad or failure about yourself  1 - -  Trouble concentrating 1 - -  Moving slowly or fidgety/restless 1 - -  Suicidal thoughts 0 - -  PHQ-9 Score 10 - -  Difficult doing work/chores Not difficult at all - -    History Sheena Morrow has a past medical history of Clotting disorder ([place]), Constipation due to opioid therapy, Old MI (myocardial infarction) (07/21/2015), Osteoporosis, Postherpetic neuralgia, and Systolic CHF with reduced left ventricular function, NYHA class 2 ([place]) (05/21/2015).   She has a past surgical history that includes Hip surgery.   Her family history is not on file.She reports that she quit smoking about 12 years ago. Her smoking use included cigarettes. She started smoking about 51 years ago. She smoked an average of .5 packs per day. She has never used smokeless tobacco. She reports that she does not drink alcohol and does not use drugs.    ROS Review of Systems  Constitutional: Negative.   HENT: Negative.    Eyes:  Negative for visual disturbance.  Respiratory:  Negative for shortness of breath.   Cardiovascular:  Negative for chest pain.  Gastrointestinal:  Negative for abdominal pain.  Musculoskeletal:  Negative for arthralgias.   Objective:  BP 102/62   Pulse (!) 108   Temp 97.9 F (36.6 C) (Temporal)   Resp 20   Ht '5\' 1"'  (1.549 m)   Wt 124  lb (56.2 kg)   SpO2 97%   BMI 23.43 kg/m   BP Readings from Last 3 Encounters:  09/29/20 102/62  05/23/20 108/60  11/05/19 130/67    Wt Readings from Last 3 Encounters:  09/29/20 124 lb (56.2 kg)  07/01/20 123 lb (55.8 kg)  05/23/20 123 lb 3.2 oz (55.9 kg)     Physical Exam Constitutional:      General: She is not in acute distress.    Appearance: She is well-developed.  HENT:     Head: Normocephalic and atraumatic.     Right Ear: External ear normal.     Left Ear: External ear normal.     Nose: Nose normal.  Eyes:     Conjunctiva/sclera: Conjunctivae normal.     Pupils: Pupils are equal, round, and reactive to light.  Neck:     Thyroid: No thyromegaly.  Cardiovascular:     Rate and Rhythm: Normal rate and regular rhythm.     Heart sounds: Normal heart sounds. No murmur heard. Pulmonary:     Effort: Pulmonary effort is normal. No respiratory distress.     Breath sounds: Normal breath sounds. No wheezing or rales.  Abdominal:     General: Bowel sounds are normal. There is no distension.     Palpations: Abdomen is soft.     Tenderness: There is no abdominal tenderness.  Musculoskeletal:  Cervical back: Normal range of motion and neck supple.  Lymphadenopathy:     Cervical: No cervical adenopathy.  Skin:    General: Skin is warm and dry.  Neurological:     Mental Status: She is alert and oriented to person, place, and time.     Deep Tendon Reflexes: Reflexes are normal and symmetric.  Psychiatric:        Behavior: Behavior normal.        Thought Content: Thought content normal.        Judgment: Judgment normal.      Assessment & Plan:   Sheena Morrow was seen today for hospitalization follow-up.  Diagnoses and all orders for this visit:  C. difficile diarrhea -     CBC with Differential/Platelet -     CMP14+EGFR -     Clostridium difficile EIA  Postherpetic neuralgia -     methadone (DOLOPHINE) 10 MG tablet; Take 1 tablet (10 mg total) by mouth 3  (three) times daily. -     methadone (DOLOPHINE) 10 MG tablet; Take 1 tablet (10 mg total) by mouth 3 (three) times daily. -     CBC with Differential/Platelet -     CMP14+EGFR -     Clostridium difficile EIA  Paroxysmal atrial fibrillation (HCC) -     CBC with Differential/Platelet -     CMP14+EGFR -     Clostridium difficile EIA  Drug-induced constipation -     CBC with Differential/Platelet -     CMP14+EGFR -     Clostridium difficile EIA  Other orders -     methadone (DOLOPHINE) 10 MG tablet; Take 1 tablet (10 mg total) by mouth 3 (three) times daily. -     polyethylene glycol powder (GLYCOLAX/MIRALAX) 17 GM/SCOOP powder; One capful in water 2 times daily      I have discontinued Chaney Malling esomeprazole, aspirin, multivitamin, butalbital-acetaminophen-caffeine, cyanocobalamin, metoprolol succinate, hydrocortisone, and acetaminophen. I have also changed her polyethylene glycol powder. Additionally, I am having her maintain her nitroGLYCERIN, Restasis, Biotin, furosemide, Linzess, RABEprazole, diltiazem, Iron (Ferrous Sulfate), Xarelto, predniSONE, gabapentin, montelukast, atorvastatin, methadone, methadone, and methadone.  Allergies as of 09/29/2020       Reactions   Nsaids Other (See Comments)   GI Bleed   Dexilant [dexlansoprazole] Diarrhea   Pantoprazole    Raloxifene    Other reaction(s): Cramps (ALLERGY/intolerance)        Medication List        Accurate as of September 29, 2020 11:59 PM. If you have any questions, ask your nurse or doctor.          STOP taking these medications    acetaminophen 500 MG tablet Commonly known as: TYLENOL Stopped by: Claretta Fraise, MD   aspirin 81 MG EC tablet Stopped by: Claretta Fraise, MD   butalbital-acetaminophen-caffeine [id number] MG tablet Commonly known as: FIORICET Stopped by: Claretta Fraise, MD   cyanocobalamin 100 MCG tablet Stopped by: Claretta Fraise, MD   esomeprazole 40 MG capsule Commonly known as:  [place] by: Claretta Fraise, MD   hydrocortisone 2.5 % cream Stopped by: Claretta Fraise, MD   metoprolol succinate 25 MG 24 hr tablet Commonly known as: TOPROL-XL Stopped by: Claretta Fraise, MD   multivitamin tablet Stopped by: Claretta Fraise, MD       TAKE these medications    atorvastatin 20 MG tablet Commonly known as: LIPITOR TAKE 1 TABLET BY MOUTH EVERY DAY   Biotin 10000 MCG Tabs Take by mouth.   diltiazem  120 MG 24 hr capsule Commonly known as: CARDIZEM CD TAKE 1 CAPSULE (120 MG TOTAL) BY MOUTH DAILY. FOR BLOOD PRESSURE   furosemide 40 MG tablet Commonly known as: LASIX TAKE 1 TABLET BY MOUTH 2 TIMES DAILY FOR SWELLING   gabapentin 800 MG tablet Commonly known as: NEURONTIN One in the Morning, one in the afternoon and two at night What changed: Another medication with the same name was removed. Continue taking this medication, and follow the directions you see here. Changed by: Claretta Fraise, MD   Iron (Ferrous Sulfate) 325 (65 Fe) MG Tabs Take 325 mg by mouth daily.   Linzess 290 MCG Caps capsule Generic drug: linaclotide TAKE 1 CAPSULE (290 MCG TOTAL) BY MOUTH DAILY. TO REGULATE BOWEL MOVEMENTS   methadone 10 MG tablet Commonly known as: Dolophine Take 1 tablet (10 mg total) by mouth 3 (three) times daily. What changed: Another medication with the same name was changed. Make sure you understand how and when to take each. Changed by: Claretta Fraise, MD   methadone 10 MG tablet Commonly known as: Dolophine Take 1 tablet (10 mg total) by mouth 3 (three) times daily. Start taking on: October 29, 2020 What changed: These instructions start on October 29, 2020. If you are unsure what to do until then, ask your doctor or other care provider. Changed by: Claretta Fraise, MD   methadone 10 MG tablet Commonly known as: Dolophine Take 1 tablet (10 mg total) by mouth 3 (three) times daily. Start taking on: November 28, 2020 What changed: These instructions  start on November 28, 2020. If you are unsure what to do until then, ask your doctor or other care provider. Changed by: Claretta Fraise, MD   montelukast 10 MG tablet Commonly known as: SINGULAIR TAKE 1 TABLET BY MOUTH EVERYDAY AT BEDTIME   nitroGLYCERIN 0.4 MG SL tablet Commonly known as: NITROSTAT Place under the tongue.   polyethylene glycol powder 17 GM/SCOOP powder Commonly known as: GLYCOLAX/MIRALAX One capful in water 2 times daily What changed: See the new instructions. Changed by: Claretta Fraise, MD   predniSONE 10 MG tablet Commonly known as: DELTASONE Take 5 daily for 2 days followed by 4,3,2 and 1 for 2 days each.   RABEprazole 20 MG tablet Commonly known as: ACIPHEX TAKE 1 TABLET BY MOUTH EVERY DAY   Restasis 0.05 % ophthalmic emulsion Generic drug: cycloSPORINE   Xarelto 10 MG Tabs tablet Generic drug: rivaroxaban Take 1 tablet (10 mg total) by mouth daily.         Follow-up: Return in about 1 month (around 10/29/2020), or if symptoms worsen or fail to improve.  Claretta Fraise, M.D.

## 2020-09-30 ENCOUNTER — Telehealth: Payer: Self-pay | Admitting: Family Medicine

## 2020-09-30 ENCOUNTER — Other Ambulatory Visit: Payer: Self-pay | Admitting: Family Medicine

## 2020-09-30 DIAGNOSIS — K625 Hemorrhage of anus and rectum: Secondary | ICD-10-CM | POA: Diagnosis not present

## 2020-09-30 DIAGNOSIS — D649 Anemia, unspecified: Secondary | ICD-10-CM | POA: Diagnosis not present

## 2020-09-30 DIAGNOSIS — B0222 Postherpetic trigeminal neuralgia: Secondary | ICD-10-CM | POA: Diagnosis not present

## 2020-09-30 DIAGNOSIS — Z66 Do not resuscitate: Secondary | ICD-10-CM | POA: Diagnosis not present

## 2020-09-30 DIAGNOSIS — I270 Primary pulmonary hypertension: Secondary | ICD-10-CM | POA: Diagnosis not present

## 2020-09-30 DIAGNOSIS — K5909 Other constipation: Secondary | ICD-10-CM | POA: Diagnosis not present

## 2020-09-30 DIAGNOSIS — N184 Chronic kidney disease, stage 4 (severe): Secondary | ICD-10-CM | POA: Diagnosis not present

## 2020-09-30 DIAGNOSIS — D62 Acute posthemorrhagic anemia: Secondary | ICD-10-CM | POA: Diagnosis not present

## 2020-09-30 DIAGNOSIS — R5383 Other fatigue: Secondary | ICD-10-CM | POA: Diagnosis not present

## 2020-09-30 DIAGNOSIS — R0602 Shortness of breath: Secondary | ICD-10-CM | POA: Diagnosis not present

## 2020-09-30 DIAGNOSIS — N189 Chronic kidney disease, unspecified: Secondary | ICD-10-CM | POA: Diagnosis not present

## 2020-09-30 DIAGNOSIS — I5042 Chronic combined systolic (congestive) and diastolic (congestive) heart failure: Secondary | ICD-10-CM | POA: Diagnosis not present

## 2020-09-30 DIAGNOSIS — K922 Gastrointestinal hemorrhage, unspecified: Secondary | ICD-10-CM | POA: Diagnosis not present

## 2020-09-30 DIAGNOSIS — M7989 Other specified soft tissue disorders: Secondary | ICD-10-CM | POA: Diagnosis not present

## 2020-09-30 DIAGNOSIS — I480 Paroxysmal atrial fibrillation: Secondary | ICD-10-CM | POA: Diagnosis not present

## 2020-09-30 DIAGNOSIS — R918 Other nonspecific abnormal finding of lung field: Secondary | ICD-10-CM | POA: Diagnosis not present

## 2020-09-30 LAB — CMP14+EGFR
ALT: 29 IU/L (ref 0–32)
AST: 41 IU/L — ABNORMAL HIGH (ref 0–40)
Albumin/Globulin Ratio: 0.7 — ABNORMAL LOW (ref 1.2–2.2)
Albumin: 2.8 g/dL — ABNORMAL LOW (ref 3.6–4.6)
Alkaline Phosphatase: 87 IU/L (ref 44–121)
BUN/Creatinine Ratio: 21 (ref 12–28)
BUN: 33 mg/dL — ABNORMAL HIGH (ref 8–27)
Bilirubin Total: 0.2 mg/dL (ref 0.0–1.2)
CO2: 29 mmol/L (ref 20–29)
Calcium: 7.9 mg/dL — ABNORMAL LOW (ref 8.7–10.3)
Chloride: 96 mmol/L (ref 96–106)
Creatinine, Ser: 1.58 mg/dL — ABNORMAL HIGH (ref 0.57–1.00)
Globulin, Total: 3.9 g/dL (ref 1.5–4.5)
Glucose: 107 mg/dL — ABNORMAL HIGH (ref 65–99)
Potassium: 4.5 mmol/L (ref 3.5–5.2)
Sodium: 137 mmol/L (ref 134–144)
Total Protein: 6.7 g/dL (ref 6.0–8.5)
eGFR: 32 mL/min/{1.73_m2} — ABNORMAL LOW (ref >59)

## 2020-09-30 LAB — CBC WITH DIFFERENTIAL/PLATELET
Basophils Absolute: 0.1 10*3/uL (ref 0.0–0.2)
Basos: 1 %
EOS (ABSOLUTE): 0.2 10*3/uL (ref 0.0–0.4)
Eos: 2 %
Hematocrit: 22.9 % — ABNORMAL LOW (ref 34.0–46.6)
Hemoglobin: 6.9 g/dL — CL (ref 11.1–15.9)
Immature Grans (Abs): 0 10*3/uL (ref 0.0–0.1)
Immature Granulocytes: 0 %
Lymphocytes Absolute: 4.7 10*3/uL — ABNORMAL HIGH (ref 0.7–3.1)
Lymphs: 52 %
MCH: 28 pg (ref 26.6–33.0)
MCHC: 30.1 g/dL — ABNORMAL LOW (ref 31.5–35.7)
MCV: 93 fL (ref 79–97)
Monocytes Absolute: 0.8 10*3/uL (ref 0.1–0.9)
Monocytes: 8 %
Neutrophils Absolute: 3.4 10*3/uL (ref 1.4–7.0)
Neutrophils: 37 %
Platelets: 350 10*3/uL (ref 150–450)
RBC: 2.46 x10E6/uL — CL (ref 3.77–5.28)
RDW: 16.4 % — ABNORMAL HIGH (ref 11.7–15.4)
WBC: 9.2 10*3/uL (ref 3.4–10.8)

## 2020-09-30 NOTE — Telephone Encounter (Signed)
Spoke with patient and she was confused about how she would get the appt with her hematologist. Advised patient that referral had been placed and that our referral dept will call and set up the appt and call her with appt details. Also advised patient that she needed to go the ER as instructed earlier due to hemoglobin being dangerously low. Patient verbalized understanding and advised that she would go to [facility]. Advised patient to contact the office with any further needs.

## 2020-10-01 DIAGNOSIS — I5032 Chronic diastolic (congestive) heart failure: Secondary | ICD-10-CM | POA: Diagnosis not present

## 2020-10-01 DIAGNOSIS — Z8619 Personal history of other infectious and parasitic diseases: Secondary | ICD-10-CM | POA: Diagnosis not present

## 2020-10-01 DIAGNOSIS — D62 Acute posthemorrhagic anemia: Secondary | ICD-10-CM | POA: Diagnosis not present

## 2020-10-01 DIAGNOSIS — K5909 Other constipation: Secondary | ICD-10-CM | POA: Diagnosis not present

## 2020-10-01 DIAGNOSIS — I482 Chronic atrial fibrillation, unspecified: Secondary | ICD-10-CM | POA: Diagnosis not present

## 2020-10-01 DIAGNOSIS — G894 Chronic pain syndrome: Secondary | ICD-10-CM | POA: Diagnosis not present

## 2020-10-01 DIAGNOSIS — N183 Chronic kidney disease, stage 3 unspecified: Secondary | ICD-10-CM | POA: Diagnosis not present

## 2020-10-01 DIAGNOSIS — I2510 Atherosclerotic heart disease of native coronary artery without angina pectoris: Secondary | ICD-10-CM | POA: Diagnosis not present

## 2020-10-01 DIAGNOSIS — K922 Gastrointestinal hemorrhage, unspecified: Secondary | ICD-10-CM | POA: Diagnosis not present

## 2020-10-01 DIAGNOSIS — R918 Other nonspecific abnormal finding of lung field: Secondary | ICD-10-CM | POA: Diagnosis not present

## 2020-10-01 DIAGNOSIS — R0602 Shortness of breath: Secondary | ICD-10-CM | POA: Diagnosis not present

## 2020-10-01 DIAGNOSIS — Z7901 Long term (current) use of anticoagulants: Secondary | ICD-10-CM | POA: Diagnosis not present

## 2020-10-01 DIAGNOSIS — I872 Venous insufficiency (chronic) (peripheral): Secondary | ICD-10-CM | POA: Diagnosis not present

## 2020-10-02 DIAGNOSIS — R5381 Other malaise: Secondary | ICD-10-CM | POA: Diagnosis not present

## 2020-10-02 DIAGNOSIS — I5042 Chronic combined systolic (congestive) and diastolic (congestive) heart failure: Secondary | ICD-10-CM | POA: Diagnosis not present

## 2020-10-02 DIAGNOSIS — Z7901 Long term (current) use of anticoagulants: Secondary | ICD-10-CM | POA: Diagnosis not present

## 2020-10-02 DIAGNOSIS — F1120 Opioid dependence, uncomplicated: Secondary | ICD-10-CM | POA: Diagnosis not present

## 2020-10-02 DIAGNOSIS — K5909 Other constipation: Secondary | ICD-10-CM | POA: Diagnosis not present

## 2020-10-02 DIAGNOSIS — I5032 Chronic diastolic (congestive) heart failure: Secondary | ICD-10-CM | POA: Diagnosis not present

## 2020-10-02 DIAGNOSIS — Z66 Do not resuscitate: Secondary | ICD-10-CM | POA: Diagnosis not present

## 2020-10-02 DIAGNOSIS — M25462 Effusion, left knee: Secondary | ICD-10-CM | POA: Diagnosis not present

## 2020-10-02 DIAGNOSIS — B0222 Postherpetic trigeminal neuralgia: Secondary | ICD-10-CM | POA: Diagnosis not present

## 2020-10-02 DIAGNOSIS — D509 Iron deficiency anemia, unspecified: Secondary | ICD-10-CM | POA: Diagnosis not present

## 2020-10-02 DIAGNOSIS — I2510 Atherosclerotic heart disease of native coronary artery without angina pectoris: Secondary | ICD-10-CM | POA: Diagnosis not present

## 2020-10-02 DIAGNOSIS — N184 Chronic kidney disease, stage 4 (severe): Secondary | ICD-10-CM | POA: Diagnosis not present

## 2020-10-02 DIAGNOSIS — I482 Chronic atrial fibrillation, unspecified: Secondary | ICD-10-CM | POA: Diagnosis not present

## 2020-10-02 DIAGNOSIS — D62 Acute posthemorrhagic anemia: Secondary | ICD-10-CM | POA: Diagnosis not present

## 2020-10-02 DIAGNOSIS — K922 Gastrointestinal hemorrhage, unspecified: Secondary | ICD-10-CM | POA: Diagnosis not present

## 2020-10-02 DIAGNOSIS — I480 Paroxysmal atrial fibrillation: Secondary | ICD-10-CM | POA: Diagnosis not present

## 2020-10-02 DIAGNOSIS — G894 Chronic pain syndrome: Secondary | ICD-10-CM | POA: Diagnosis not present

## 2020-10-02 DIAGNOSIS — D649 Anemia, unspecified: Secondary | ICD-10-CM | POA: Diagnosis not present

## 2020-10-02 DIAGNOSIS — S82141A Displaced bicondylar fracture of right tibia, initial encounter for closed fracture: Secondary | ICD-10-CM | POA: Diagnosis not present

## 2020-10-02 DIAGNOSIS — Z8619 Personal history of other infectious and parasitic diseases: Secondary | ICD-10-CM | POA: Diagnosis not present

## 2020-10-02 DIAGNOSIS — I270 Primary pulmonary hypertension: Secondary | ICD-10-CM | POA: Diagnosis not present

## 2020-10-03 ENCOUNTER — Encounter: Payer: Self-pay | Admitting: Family Medicine

## 2020-10-03 DIAGNOSIS — D649 Anemia, unspecified: Secondary | ICD-10-CM | POA: Diagnosis not present

## 2020-10-03 DIAGNOSIS — D509 Iron deficiency anemia, unspecified: Secondary | ICD-10-CM | POA: Diagnosis not present

## 2020-10-05 DIAGNOSIS — Z66 Do not resuscitate: Secondary | ICD-10-CM | POA: Diagnosis not present

## 2020-10-05 DIAGNOSIS — K922 Gastrointestinal hemorrhage, unspecified: Secondary | ICD-10-CM | POA: Diagnosis not present

## 2020-10-05 DIAGNOSIS — I270 Primary pulmonary hypertension: Secondary | ICD-10-CM | POA: Diagnosis not present

## 2020-10-05 DIAGNOSIS — D62 Acute posthemorrhagic anemia: Secondary | ICD-10-CM | POA: Diagnosis not present

## 2020-10-05 DIAGNOSIS — N184 Chronic kidney disease, stage 4 (severe): Secondary | ICD-10-CM | POA: Diagnosis not present

## 2020-10-05 DIAGNOSIS — K5909 Other constipation: Secondary | ICD-10-CM | POA: Diagnosis not present

## 2020-10-05 DIAGNOSIS — I5042 Chronic combined systolic (congestive) and diastolic (congestive) heart failure: Secondary | ICD-10-CM | POA: Diagnosis not present

## 2020-10-05 DIAGNOSIS — B0222 Postherpetic trigeminal neuralgia: Secondary | ICD-10-CM | POA: Diagnosis not present

## 2020-10-05 DIAGNOSIS — I480 Paroxysmal atrial fibrillation: Secondary | ICD-10-CM | POA: Diagnosis not present

## 2020-10-10 ENCOUNTER — Telehealth: Payer: Self-pay

## 2020-10-10 NOTE — Telephone Encounter (Signed)
Transition Care Management Unsuccessful Follow-up Telephone Call  Date of discharge and from where:  Sheena Morrow 10/06/20  Diagnosis: Anemia   Attempts:  1st Attempt  Reason for unsuccessful TCM follow-up call:  Left voice message

## 2020-10-11 ENCOUNTER — Telehealth: Payer: Self-pay | Admitting: *Deleted

## 2020-10-11 NOTE — Telephone Encounter (Signed)
Rip Harbour from [facility] called and states that patient is unable to complete therapy at this time where she is currently sick. She states that patient will call them back when she feels up to starting therapy. They currently do not have a therapy start date at this time.

## 2020-10-11 NOTE — Telephone Encounter (Signed)
Thank you. WS

## 2020-10-18 NOTE — Telephone Encounter (Signed)
Transition Care Management Unsuccessful Follow-up Telephone Call  Date of discharge and from where:  [facility] 10/06/20  Diagnosis:  anemia   Attempts:  2nd Attempt  Reason for unsuccessful TCM follow-up call:  Left voice message   Unable to reach patient for TCM visit

## 2020-10-31 ENCOUNTER — Other Ambulatory Visit: Payer: Self-pay | Admitting: *Deleted

## 2020-10-31 MED ORDER — DILTIAZEM HCL ER COATED BEADS 120 MG PO CP24: 120.0000 mg | ORAL_CAPSULE | Freq: Every day | ORAL | 0 refills | Status: AC

## 2020-11-03 DIAGNOSIS — M25462 Effusion, left knee: Secondary | ICD-10-CM | POA: Diagnosis not present

## 2020-11-03 DIAGNOSIS — S82141A Displaced bicondylar fracture of right tibia, initial encounter for closed fracture: Secondary | ICD-10-CM | POA: Diagnosis not present

## 2020-11-04 ENCOUNTER — Telehealth: Payer: Self-pay | Admitting: Family Medicine

## 2020-11-04 ENCOUNTER — Other Ambulatory Visit: Payer: Self-pay | Admitting: Family Medicine

## 2020-11-04 DIAGNOSIS — D500 Iron deficiency anemia secondary to blood loss (chronic): Secondary | ICD-10-CM | POA: Diagnosis not present

## 2020-11-04 DIAGNOSIS — I130 Hypertensive heart and chronic kidney disease with heart failure and stage 1 through stage 4 chronic kidney disease, or unspecified chronic kidney disease: Secondary | ICD-10-CM | POA: Diagnosis not present

## 2020-11-04 DIAGNOSIS — I25119 Atherosclerotic heart disease of native coronary artery with unspecified angina pectoris: Secondary | ICD-10-CM | POA: Diagnosis not present

## 2020-11-04 DIAGNOSIS — N184 Chronic kidney disease, stage 4 (severe): Secondary | ICD-10-CM | POA: Diagnosis not present

## 2020-11-04 DIAGNOSIS — I5032 Chronic diastolic (congestive) heart failure: Secondary | ICD-10-CM | POA: Diagnosis not present

## 2020-11-04 DIAGNOSIS — G894 Chronic pain syndrome: Secondary | ICD-10-CM | POA: Diagnosis not present

## 2020-11-04 DIAGNOSIS — B0229 Other postherpetic nervous system involvement: Secondary | ICD-10-CM | POA: Diagnosis not present

## 2020-11-04 DIAGNOSIS — D631 Anemia in chronic kidney disease: Secondary | ICD-10-CM | POA: Diagnosis not present

## 2020-11-04 DIAGNOSIS — I482 Chronic atrial fibrillation, unspecified: Secondary | ICD-10-CM | POA: Diagnosis not present

## 2020-11-04 NOTE — Telephone Encounter (Signed)
Barnetta Chapel from [facility] called requested verbal approval for patient to have PT 1x per week for 1 week and 2x per week for 8 weeks for fall prevention and gait training.  Harrington Challenger a verbal OK.

## 2020-11-09 DIAGNOSIS — B0229 Other postherpetic nervous system involvement: Secondary | ICD-10-CM | POA: Diagnosis not present

## 2020-11-09 DIAGNOSIS — I5032 Chronic diastolic (congestive) heart failure: Secondary | ICD-10-CM | POA: Diagnosis not present

## 2020-11-09 DIAGNOSIS — I25119 Atherosclerotic heart disease of native coronary artery with unspecified angina pectoris: Secondary | ICD-10-CM | POA: Diagnosis not present

## 2020-11-09 DIAGNOSIS — I130 Hypertensive heart and chronic kidney disease with heart failure and stage 1 through stage 4 chronic kidney disease, or unspecified chronic kidney disease: Secondary | ICD-10-CM | POA: Diagnosis not present

## 2020-11-09 DIAGNOSIS — G894 Chronic pain syndrome: Secondary | ICD-10-CM | POA: Diagnosis not present

## 2020-11-09 DIAGNOSIS — D500 Iron deficiency anemia secondary to blood loss (chronic): Secondary | ICD-10-CM | POA: Diagnosis not present

## 2020-11-09 DIAGNOSIS — D631 Anemia in chronic kidney disease: Secondary | ICD-10-CM | POA: Diagnosis not present

## 2020-11-09 DIAGNOSIS — N184 Chronic kidney disease, stage 4 (severe): Secondary | ICD-10-CM | POA: Diagnosis not present

## 2020-11-09 DIAGNOSIS — I482 Chronic atrial fibrillation, unspecified: Secondary | ICD-10-CM | POA: Diagnosis not present

## 2020-11-11 DIAGNOSIS — N184 Chronic kidney disease, stage 4 (severe): Secondary | ICD-10-CM | POA: Diagnosis not present

## 2020-11-11 DIAGNOSIS — D631 Anemia in chronic kidney disease: Secondary | ICD-10-CM | POA: Diagnosis not present

## 2020-11-11 DIAGNOSIS — I5032 Chronic diastolic (congestive) heart failure: Secondary | ICD-10-CM | POA: Diagnosis not present

## 2020-11-11 DIAGNOSIS — D500 Iron deficiency anemia secondary to blood loss (chronic): Secondary | ICD-10-CM | POA: Diagnosis not present

## 2020-11-11 DIAGNOSIS — I482 Chronic atrial fibrillation, unspecified: Secondary | ICD-10-CM | POA: Diagnosis not present

## 2020-11-11 DIAGNOSIS — G894 Chronic pain syndrome: Secondary | ICD-10-CM | POA: Diagnosis not present

## 2020-11-11 DIAGNOSIS — B0229 Other postherpetic nervous system involvement: Secondary | ICD-10-CM | POA: Diagnosis not present

## 2020-11-11 DIAGNOSIS — I25119 Atherosclerotic heart disease of native coronary artery with unspecified angina pectoris: Secondary | ICD-10-CM | POA: Diagnosis not present

## 2020-11-11 DIAGNOSIS — I130 Hypertensive heart and chronic kidney disease with heart failure and stage 1 through stage 4 chronic kidney disease, or unspecified chronic kidney disease: Secondary | ICD-10-CM | POA: Diagnosis not present

## 2020-11-14 DIAGNOSIS — D631 Anemia in chronic kidney disease: Secondary | ICD-10-CM | POA: Diagnosis not present

## 2020-11-14 DIAGNOSIS — I5032 Chronic diastolic (congestive) heart failure: Secondary | ICD-10-CM | POA: Diagnosis not present

## 2020-11-14 DIAGNOSIS — B0229 Other postherpetic nervous system involvement: Secondary | ICD-10-CM | POA: Diagnosis not present

## 2020-11-14 DIAGNOSIS — I482 Chronic atrial fibrillation, unspecified: Secondary | ICD-10-CM | POA: Diagnosis not present

## 2020-11-14 DIAGNOSIS — G894 Chronic pain syndrome: Secondary | ICD-10-CM | POA: Diagnosis not present

## 2020-11-14 DIAGNOSIS — I130 Hypertensive heart and chronic kidney disease with heart failure and stage 1 through stage 4 chronic kidney disease, or unspecified chronic kidney disease: Secondary | ICD-10-CM | POA: Diagnosis not present

## 2020-11-14 DIAGNOSIS — N184 Chronic kidney disease, stage 4 (severe): Secondary | ICD-10-CM | POA: Diagnosis not present

## 2020-11-14 DIAGNOSIS — D500 Iron deficiency anemia secondary to blood loss (chronic): Secondary | ICD-10-CM | POA: Diagnosis not present

## 2020-11-14 DIAGNOSIS — I25119 Atherosclerotic heart disease of native coronary artery with unspecified angina pectoris: Secondary | ICD-10-CM | POA: Diagnosis not present

## 2020-11-16 ENCOUNTER — Telehealth: Payer: Self-pay | Admitting: Family Medicine

## 2020-11-16 DIAGNOSIS — G894 Chronic pain syndrome: Secondary | ICD-10-CM | POA: Diagnosis not present

## 2020-11-16 DIAGNOSIS — B0229 Other postherpetic nervous system involvement: Secondary | ICD-10-CM | POA: Diagnosis not present

## 2020-11-16 DIAGNOSIS — N184 Chronic kidney disease, stage 4 (severe): Secondary | ICD-10-CM | POA: Diagnosis not present

## 2020-11-16 DIAGNOSIS — D631 Anemia in chronic kidney disease: Secondary | ICD-10-CM | POA: Diagnosis not present

## 2020-11-16 DIAGNOSIS — I5032 Chronic diastolic (congestive) heart failure: Secondary | ICD-10-CM | POA: Diagnosis not present

## 2020-11-16 DIAGNOSIS — I130 Hypertensive heart and chronic kidney disease with heart failure and stage 1 through stage 4 chronic kidney disease, or unspecified chronic kidney disease: Secondary | ICD-10-CM | POA: Diagnosis not present

## 2020-11-16 DIAGNOSIS — I25119 Atherosclerotic heart disease of native coronary artery with unspecified angina pectoris: Secondary | ICD-10-CM | POA: Diagnosis not present

## 2020-11-16 DIAGNOSIS — I482 Chronic atrial fibrillation, unspecified: Secondary | ICD-10-CM | POA: Diagnosis not present

## 2020-11-16 DIAGNOSIS — D500 Iron deficiency anemia secondary to blood loss (chronic): Secondary | ICD-10-CM | POA: Diagnosis not present

## 2020-11-16 NOTE — Telephone Encounter (Signed)
Sheena Morrow called from [facility] to let Dr Livia Snellen know that pt is not doing well. Says pt had a 30 day hospital stay and since being home pt has had SOB, increased fluid, and difficulty managing life independently. Says pt reports having an asthma attack last night but did not having anything to take for it. Says pt refused to call 911 for help. Pt said she would get her step daughter to take her to Urgent Care when able to.   Says pt is having SOB but O2 was in high 90s. Does not have good family support.

## 2020-11-17 ENCOUNTER — Inpatient Hospital Stay (HOSPITAL_COMMUNITY)
Admission: EM | Admit: 2020-11-17 | Discharge: 2020-11-27 | DRG: 291 | Disposition: A | Discharge status: Hospice | Payer: Medicare HMO | Source: Ambulatory Visit | Attending: Family Medicine | Admitting: Family Medicine

## 2020-11-17 ENCOUNTER — Ambulatory Visit (INDEPENDENT_AMBULATORY_CARE_PROVIDER_SITE_OTHER): Payer: Medicare HMO | Admitting: Family Medicine

## 2020-11-17 ENCOUNTER — Encounter (HOSPITAL_COMMUNITY): Payer: Self-pay | Admitting: Emergency Medicine

## 2020-11-17 ENCOUNTER — Emergency Department (HOSPITAL_COMMUNITY): Payer: Medicare HMO

## 2020-11-17 ENCOUNTER — Encounter: Payer: Self-pay | Admitting: Family Medicine

## 2020-11-17 ENCOUNTER — Other Ambulatory Visit: Payer: Self-pay

## 2020-11-17 VITALS — BP 106/64 | HR 70 | Temp 97.0°F | Resp 20

## 2020-11-17 DIAGNOSIS — Z79899 Other long term (current) drug therapy: Secondary | ICD-10-CM

## 2020-11-17 DIAGNOSIS — Z87891 Personal history of nicotine dependence: Secondary | ICD-10-CM

## 2020-11-17 DIAGNOSIS — R338 Other retention of urine: Secondary | ICD-10-CM

## 2020-11-17 DIAGNOSIS — N1832 Chronic kidney disease, stage 3b: Secondary | ICD-10-CM | POA: Diagnosis not present

## 2020-11-17 DIAGNOSIS — I5043 Acute on chronic combined systolic (congestive) and diastolic (congestive) heart failure: Principal | ICD-10-CM | POA: Diagnosis present

## 2020-11-17 DIAGNOSIS — R7401 Elevation of levels of liver transaminase levels: Secondary | ICD-10-CM | POA: Diagnosis present

## 2020-11-17 DIAGNOSIS — I482 Chronic atrial fibrillation, unspecified: Secondary | ICD-10-CM | POA: Diagnosis not present

## 2020-11-17 DIAGNOSIS — Z79891 Long term (current) use of opiate analgesic: Secondary | ICD-10-CM

## 2020-11-17 DIAGNOSIS — F1120 Opioid dependence, uncomplicated: Secondary | ICD-10-CM | POA: Diagnosis not present

## 2020-11-17 DIAGNOSIS — J45909 Unspecified asthma, uncomplicated: Secondary | ICD-10-CM | POA: Diagnosis present

## 2020-11-17 DIAGNOSIS — T68XXXA Hypothermia, initial encounter: Secondary | ICD-10-CM

## 2020-11-17 DIAGNOSIS — D649 Anemia, unspecified: Secondary | ICD-10-CM

## 2020-11-17 DIAGNOSIS — Z8673 Personal history of transient ischemic attack (TIA), and cerebral infarction without residual deficits: Secondary | ICD-10-CM | POA: Diagnosis not present

## 2020-11-17 DIAGNOSIS — R7881 Bacteremia: Secondary | ICD-10-CM

## 2020-11-17 DIAGNOSIS — A0472 Enterocolitis due to Clostridium difficile, not specified as recurrent: Secondary | ICD-10-CM | POA: Diagnosis not present

## 2020-11-17 DIAGNOSIS — I872 Venous insufficiency (chronic) (peripheral): Secondary | ICD-10-CM | POA: Diagnosis present

## 2020-11-17 DIAGNOSIS — I252 Old myocardial infarction: Secondary | ICD-10-CM | POA: Diagnosis not present

## 2020-11-17 DIAGNOSIS — M810 Age-related osteoporosis without current pathological fracture: Secondary | ICD-10-CM | POA: Diagnosis present

## 2020-11-17 DIAGNOSIS — I5033 Acute on chronic diastolic (congestive) heart failure: Secondary | ICD-10-CM | POA: Diagnosis not present

## 2020-11-17 DIAGNOSIS — N189 Chronic kidney disease, unspecified: Secondary | ICD-10-CM | POA: Diagnosis not present

## 2020-11-17 DIAGNOSIS — D631 Anemia in chronic kidney disease: Secondary | ICD-10-CM | POA: Diagnosis not present

## 2020-11-17 DIAGNOSIS — B0229 Other postherpetic nervous system involvement: Secondary | ICD-10-CM | POA: Diagnosis not present

## 2020-11-17 DIAGNOSIS — M7989 Other specified soft tissue disorders: Secondary | ICD-10-CM | POA: Diagnosis not present

## 2020-11-17 DIAGNOSIS — I509 Heart failure, unspecified: Secondary | ICD-10-CM

## 2020-11-17 DIAGNOSIS — I5020 Unspecified systolic (congestive) heart failure: Secondary | ICD-10-CM

## 2020-11-17 DIAGNOSIS — L8990 Pressure ulcer of unspecified site, unspecified stage: Secondary | ICD-10-CM | POA: Insufficient documentation

## 2020-11-17 DIAGNOSIS — Z7901 Long term (current) use of anticoagulants: Secondary | ICD-10-CM

## 2020-11-17 DIAGNOSIS — R601 Generalized edema: Secondary | ICD-10-CM | POA: Diagnosis not present

## 2020-11-17 DIAGNOSIS — E8809 Other disorders of plasma-protein metabolism, not elsewhere classified: Secondary | ICD-10-CM | POA: Diagnosis present

## 2020-11-17 DIAGNOSIS — Z20822 Contact with and (suspected) exposure to covid-19: Secondary | ICD-10-CM | POA: Diagnosis not present

## 2020-11-17 DIAGNOSIS — N184 Chronic kidney disease, stage 4 (severe): Secondary | ICD-10-CM | POA: Diagnosis not present

## 2020-11-17 DIAGNOSIS — R339 Retention of urine, unspecified: Secondary | ICD-10-CM | POA: Diagnosis present

## 2020-11-17 DIAGNOSIS — E43 Unspecified severe protein-calorie malnutrition: Secondary | ICD-10-CM | POA: Diagnosis not present

## 2020-11-17 DIAGNOSIS — E039 Hypothyroidism, unspecified: Secondary | ICD-10-CM | POA: Diagnosis present

## 2020-11-17 DIAGNOSIS — N179 Acute kidney failure, unspecified: Secondary | ICD-10-CM | POA: Diagnosis not present

## 2020-11-17 DIAGNOSIS — Z66 Do not resuscitate: Secondary | ICD-10-CM | POA: Diagnosis not present

## 2020-11-17 DIAGNOSIS — Z515 Encounter for palliative care: Secondary | ICD-10-CM | POA: Diagnosis not present

## 2020-11-17 DIAGNOSIS — I248 Other forms of acute ischemic heart disease: Secondary | ICD-10-CM | POA: Diagnosis present

## 2020-11-17 DIAGNOSIS — L89151 Pressure ulcer of sacral region, stage 1: Secondary | ICD-10-CM | POA: Diagnosis present

## 2020-11-17 DIAGNOSIS — Z886 Allergy status to analgesic agent status: Secondary | ICD-10-CM

## 2020-11-17 DIAGNOSIS — R680 Hypothermia, not associated with low environmental temperature: Secondary | ICD-10-CM | POA: Diagnosis present

## 2020-11-17 DIAGNOSIS — R0602 Shortness of breath: Secondary | ICD-10-CM | POA: Diagnosis not present

## 2020-11-17 DIAGNOSIS — E875 Hyperkalemia: Secondary | ICD-10-CM | POA: Diagnosis present

## 2020-11-17 DIAGNOSIS — I2510 Atherosclerotic heart disease of native coronary artery without angina pectoris: Secondary | ICD-10-CM | POA: Diagnosis present

## 2020-11-17 DIAGNOSIS — I110 Hypertensive heart disease with heart failure: Secondary | ICD-10-CM | POA: Diagnosis not present

## 2020-11-17 DIAGNOSIS — I130 Hypertensive heart and chronic kidney disease with heart failure and stage 1 through stage 4 chronic kidney disease, or unspecified chronic kidney disease: Secondary | ICD-10-CM | POA: Diagnosis not present

## 2020-11-17 DIAGNOSIS — K5520 Angiodysplasia of colon without hemorrhage: Secondary | ICD-10-CM | POA: Diagnosis present

## 2020-11-17 DIAGNOSIS — D500 Iron deficiency anemia secondary to blood loss (chronic): Secondary | ICD-10-CM | POA: Diagnosis present

## 2020-11-17 DIAGNOSIS — I5032 Chronic diastolic (congestive) heart failure: Secondary | ICD-10-CM | POA: Diagnosis not present

## 2020-11-17 DIAGNOSIS — R0609 Other forms of dyspnea: Secondary | ICD-10-CM | POA: Diagnosis not present

## 2020-11-17 DIAGNOSIS — Z888 Allergy status to other drugs, medicaments and biological substances status: Secondary | ICD-10-CM

## 2020-11-17 DIAGNOSIS — I25119 Atherosclerotic heart disease of native coronary artery with unspecified angina pectoris: Secondary | ICD-10-CM | POA: Diagnosis not present

## 2020-11-17 DIAGNOSIS — L89612 Pressure ulcer of right heel, stage 2: Secondary | ICD-10-CM | POA: Diagnosis present

## 2020-11-17 DIAGNOSIS — G894 Chronic pain syndrome: Secondary | ICD-10-CM | POA: Diagnosis not present

## 2020-11-17 LAB — CBC WITH DIFFERENTIAL/PLATELET
Abs Immature Granulocytes: 0.04 10*3/uL (ref 0.00–0.07)
Basophils Absolute: 0 10*3/uL (ref 0.0–0.1)
Basophils Relative: 1 %
Eosinophils Absolute: 0 10*3/uL (ref 0.0–0.5)
Eosinophils Relative: 0 %
HCT: 25.3 % — ABNORMAL LOW (ref 36.0–46.0)
Hemoglobin: 7.2 g/dL — ABNORMAL LOW (ref 12.0–15.0)
Immature Granulocytes: 1 %
Lymphocytes Relative: 25 %
Lymphs Abs: 1.8 10*3/uL (ref 0.7–4.0)
MCH: 25.4 pg — ABNORMAL LOW (ref 26.0–34.0)
MCHC: 28.5 g/dL — ABNORMAL LOW (ref 30.0–36.0)
MCV: 89.1 fL (ref 80.0–100.0)
Monocytes Absolute: 0.5 10*3/uL (ref 0.1–1.0)
Monocytes Relative: 7 %
Neutro Abs: 4.9 10*3/uL (ref 1.7–7.7)
Neutrophils Relative %: 66 %
Platelets: 328 10*3/uL (ref 150–400)
RBC: 2.84 MIL/uL — ABNORMAL LOW (ref 3.87–5.11)
RDW: 19.8 % — ABNORMAL HIGH (ref 11.5–15.5)
WBC: 7.4 10*3/uL (ref 4.0–10.5)
nRBC: 0 % (ref 0.0–0.2)

## 2020-11-17 LAB — COMPREHENSIVE METABOLIC PANEL
ALT: 81 U/L — ABNORMAL HIGH (ref 0–44)
AST: 187 U/L — ABNORMAL HIGH (ref 15–41)
Albumin: 1.8 g/dL — ABNORMAL LOW (ref 3.5–5.0)
Alkaline Phosphatase: 95 U/L (ref 38–126)
Anion gap: 13 (ref 5–15)
BUN: 39 mg/dL — ABNORMAL HIGH (ref 8–23)
CO2: 25 mmol/L (ref 22–32)
Calcium: 8.7 mg/dL — ABNORMAL LOW (ref 8.9–10.3)
Chloride: 95 mmol/L — ABNORMAL LOW (ref 98–111)
Creatinine, Ser: 2.03 mg/dL — ABNORMAL HIGH (ref 0.44–1.00)
GFR, Estimated: 24 mL/min — ABNORMAL LOW (ref >60)
Glucose, Bld: 77 mg/dL (ref 70–99)
Potassium: 5.3 mmol/L — ABNORMAL HIGH (ref 3.5–5.1)
Sodium: 133 mmol/L — ABNORMAL LOW (ref 135–145)
Total Bilirubin: 0.7 mg/dL (ref 0.3–1.2)
Total Protein: 7.6 g/dL (ref 6.5–8.1)

## 2020-11-17 LAB — RESP PANEL BY RT-PCR (FLU A&B, COVID) ARPGX2
Influenza A by PCR: NEGATIVE
Influenza B by PCR: NEGATIVE
SARS Coronavirus 2 by RT PCR: NEGATIVE

## 2020-11-17 LAB — BRAIN NATRIURETIC PEPTIDE: B Natriuretic Peptide: 687.1 pg/mL — ABNORMAL HIGH (ref 0.0–100.0)

## 2020-11-17 LAB — ABO/RH: ABO/RH(D): O POS

## 2020-11-17 LAB — PREPARE RBC (CROSSMATCH)

## 2020-11-17 LAB — TROPONIN I (HIGH SENSITIVITY)
Troponin I (High Sensitivity): 119 ng/L (ref <18)
Troponin I (High Sensitivity): 116 ng/L (ref <18)

## 2020-11-17 MED ORDER — SODIUM CHLORIDE 0.9 % IV SOLN
10.0000 mL/h | Freq: Once | INTRAVENOUS | Status: AC
  Administered 2020-11-17: 10 mL/h via INTRAVENOUS

## 2020-11-17 MED ORDER — FUROSEMIDE 10 MG/ML IJ SOLN
80.0000 mg | Freq: Once | INTRAMUSCULAR | Status: AC
  Administered 2020-11-17: 80 mg via INTRAVENOUS
  Filled 2020-11-17: qty 8

## 2020-11-17 NOTE — Progress Notes (Signed)
Subjective:  Patient ID: Sheena Morrow, female    DOB: [birth year]  Age: 87 y.o. MRN: [id number]  CC: No chief complaint on file.   HPI Sheena Morrow presents for increased swelling over the last week. She has not been able to urinate for several weeks except for a tiny amount. Now she is swollen everywhere. She is experiencing dyspnea increasing for the last three days. She has a history of asthma, but no recent flare. She has A. Fib and ASCVD with a history of CHF. Energy is virtually zero. Unable to perform ADLs currently.   Pt. Has long term opiate use due to excruciating pain from post herpetic neuralgia. Could not stand today for her weight.  Depression screen [facility] 2/9 11/17/2020 09/29/2020 07/01/2020  Decreased Interest 0 2 0  Down, Depressed, Hopeless 0 1 0  PHQ - 2 Score 0 3 0  Altered sleeping 0 0 -  Tired, decreased energy 0 3 -  Change in appetite 0 1 -  Feeling bad or failure about yourself  0 1 -  Trouble concentrating 0 1 -  Moving slowly or fidgety/restless 0 1 -  Suicidal thoughts 0 0 -  PHQ-9 Score 0 10 -  Difficult doing work/chores - Not difficult at all -    History Sheena Morrow has a past medical history of Clotting disorder ([place]), Constipation due to opioid therapy, Old MI (myocardial infarction) (07/21/2015), Osteoporosis, Postherpetic neuralgia, and Systolic CHF with reduced left ventricular function, NYHA class 2 ([place]) (05/21/2015).   She has a past surgical history that includes Hip surgery.   Her family history is not on file.She reports that she quit smoking about 12 years ago. Her smoking use included cigarettes. She started smoking about 51 years ago. She smoked an average of .5 packs per day. She has never used smokeless tobacco. She reports that she does not drink alcohol and does not use drugs.    ROS Review of Systems  Constitutional: Negative.   HENT: Negative.    Eyes:  Negative for visual disturbance.  Respiratory:  Positive for shortness of breath.    Cardiovascular:  Positive for leg swelling. Negative for chest pain.  Gastrointestinal:  Negative for abdominal pain.  Musculoskeletal:  Negative for arthralgias.   Objective:  BP 106/64   Pulse 70   Temp (!) 97 F (36.1 C) (Temporal)   Resp 20   SpO2 94%   BP Readings from Last 3 Encounters:  11/17/20 106/64  09/29/20 102/62  05/23/20 108/60    Wt Readings from Last 3 Encounters:  09/29/20 124 lb (56.2 kg)  07/01/20 123 lb (55.8 kg)  05/23/20 123 lb 3.2 oz (55.9 kg)     Physical Exam Constitutional:      General: She is not in acute distress.    Appearance: She is well-developed.  HENT:     Head: Normocephalic and atraumatic.  Eyes:     Conjunctiva/sclera: Conjunctivae normal.     Pupils: Pupils are equal, round, and reactive to light.  Neck:     Thyroid: No thyromegaly.  Cardiovascular:     Rate and Rhythm: Normal rate and regular rhythm.     Heart sounds: Normal heart sounds. No murmur heard. Pulmonary:     Effort: Pulmonary effort is normal. No respiratory distress.     Breath sounds: Normal breath sounds. No wheezing or rales.  Abdominal:     General: Bowel sounds are normal. There is no distension.     Palpations: Abdomen is soft.  Tenderness: There is no abdominal tenderness.  Musculoskeletal:        General: Swelling present. Normal range of motion.     Cervical back: Normal range of motion and neck supple.     Right lower leg: Edema (5+) present.     Left lower leg: Edema (4+ swelling with pitting to the thigh) present.  Lymphadenopathy:     Cervical: No cervical adenopathy.  Skin:    General: Skin is warm and dry.  Neurological:     Mental Status: She is alert and oriented to person, place, and time.  Psychiatric:        Behavior: Behavior normal.        Thought Content: Thought content normal.        Judgment: Judgment normal.      Assessment & Plan:   Diagnoses and all orders for this visit:  Anasarca  Systolic CHF with reduced  left ventricular function, NYHA class 2 (HCC)  Stage 3b chronic kidney disease ([place])  Uncomplicated opioid dependence ([place])      I have discontinued Adonis Huguenin Loor's predniSONE. I am also having her maintain her nitroGLYCERIN, Restasis, Biotin, furosemide, Linzess, RABEprazole, Iron (Ferrous Sulfate), Xarelto, gabapentin, montelukast, methadone, methadone, methadone, polyethylene glycol powder, diltiazem, and atorvastatin.  Allergies as of 11/17/2020       Reactions   Nsaids Other (See Comments)   GI Bleed   Dexilant [dexlansoprazole] Diarrhea   Pantoprazole    Raloxifene    Other reaction(s): Cramps (ALLERGY/intolerance)        Medication List        Accurate as of November 17, 2020  4:48 PM. If you have any questions, ask your nurse or doctor.          STOP taking these medications    predniSONE 10 MG tablet Commonly known as: DELTASONE Stopped by: Claretta Fraise, MD       TAKE these medications    atorvastatin 20 MG tablet Commonly known as: LIPITOR TAKE 1 TABLET BY MOUTH EVERY DAY   Biotin 10000 MCG Tabs Take by mouth.   diltiazem 120 MG 24 hr capsule Commonly known as: CARDIZEM CD Take 1 capsule (120 mg total) by mouth daily. For blood pressure   furosemide 40 MG tablet Commonly known as: LASIX TAKE 1 TABLET BY MOUTH 2 TIMES DAILY FOR SWELLING   gabapentin 800 MG tablet Commonly known as: NEURONTIN One in the Morning, one in the afternoon and two at night   Iron (Ferrous Sulfate) 325 (65 Fe) MG Tabs Take 325 mg by mouth daily.   Linzess 290 MCG Caps capsule Generic drug: linaclotide TAKE 1 CAPSULE (290 MCG TOTAL) BY MOUTH DAILY. TO REGULATE BOWEL MOVEMENTS   methadone 10 MG tablet Commonly known as: Dolophine Take 1 tablet (10 mg total) by mouth 3 (three) times daily.   methadone 10 MG tablet Commonly known as: Dolophine Take 1 tablet (10 mg total) by mouth 3 (three) times daily.   methadone 10 MG tablet Commonly known as:  Dolophine Take 1 tablet (10 mg total) by mouth 3 (three) times daily. Start taking on: November 28, 2020   montelukast 10 MG tablet Commonly known as: SINGULAIR TAKE 1 TABLET BY MOUTH EVERYDAY AT BEDTIME   nitroGLYCERIN 0.4 MG SL tablet Commonly known as: NITROSTAT Place under the tongue.   polyethylene glycol powder 17 GM/SCOOP powder Commonly known as: GLYCOLAX/MIRALAX One capful in water 2 times daily   RABEprazole 20 MG tablet Commonly known as: ACIPHEX TAKE  1 TABLET BY MOUTH EVERY DAY   Restasis 0.05 % ophthalmic emulsion Generic drug: cycloSPORINE   Xarelto 10 MG Tabs tablet Generic drug: rivaroxaban Take 1 tablet (10 mg total) by mouth daily.       [facility] for CHF, anasarca and renal insufficiency.  Follow-up: Return in about 2 weeks (around 12/13/2020).  Claretta Fraise, M.D.

## 2020-11-17 NOTE — Telephone Encounter (Signed)
Please call and recommend she be seen. Can come here if desired, but since shelives in [place], I recommend the E.D. in [place] or at [facility] (They are close and less busy than the big hospitals.)

## 2020-11-17 NOTE — Telephone Encounter (Signed)
Tried to call patient but there was no answer and her voicemail is not set up.  I contacted Butch Penny from home health and left her a message informing her of what Dr. Livia Snellen has recommended and letting her know I was unable to reach patient.

## 2020-11-17 NOTE — ED Provider Notes (Signed)
[facility] EMERGENCY DEPARTMENT Provider Note   CSN: [id number] Arrival date & time: 11/17/20  1726     History Chief Complaint  Patient presents with   Shortness of Breath    Sheena Morrow is a 87 y.o. female.  Patient sent in from [place] [facility] they were seen today.  Patient with increased swelling over the past week to her lower extremities and face patient's not been able to urinate for several weeks except for a tiny amount.  Now she is swollen everywhere.  She explains during some shortness of breath increasing for the last 3 days.  Patient has a history of asthma but was not doing any wheezing.  She had a history of congestive heart failure systolic atrial fib is on blood thinner Xarelto.  Patient has long-term opiate use and is on methadone.  Patient also has a history of anemia due to chronic blood loss.  Patient was transfused [facility] in September.  For hemoglobin in the sixes.  No family has arrived with patient.  But brought in by EMS.  They feel that the chronic blood loss is secondary to the blood thinners.  On our exam patient is a little bradycardic.  Cardiac monitor shows atrial fibrillation but slow heart rate.  Heart rate in the 40s.  Respirations 24 oxygen sats are 98% on room air.      Past Medical History:  Diagnosis Date   Clotting disorder ([place])    Constipation due to opioid therapy    Old MI (myocardial infarction) 07/21/2015   Osteoporosis    Postherpetic neuralgia    Systolic CHF with reduced left ventricular function, NYHA class 2 ([place]) 05/21/2015    Patient Active Problem List   Diagnosis Date Noted   Closed head injury 07/07/2020   Asthma 01/13/2020   Constipation 01/13/2020   GERD (gastroesophageal reflux disease) 01/13/2020   Hyperlipidemia 01/13/2020   Onychomycosis 01/13/2020   Urinary retention 01/02/2020   Chronic pain syndrome 12/31/2019   Tibial plateau fracture, left 12/29/2019   Iron  deficiency anemia due to chronic blood loss 06/26/2019   Macrocytic anemia 05/14/2019   Memory loss 42/68/3419   Uncomplicated opioid dependence ([place]) 08/11/2018   CKD (chronic kidney disease), stage III ([place]) 02/20/2018   Bullous pemphigoid 06/26/2017   Elevated liver function tests 07/13/2016   Keratoconjunctivitis sicca of both eyes not specified as Sjogren's 11/02/2015   Nuclear sclerotic cataract of both eyes 11/02/2015   Old MI (myocardial infarction) 07/21/2015   CAD in native artery 07/11/2015   History of acute inferior wall MI 07/11/2015   Paroxysmal atrial fibrillation ([place]) 62/22/9798   Systolic CHF with reduced left ventricular function, NYHA class 2 ([place]) 05/21/2015   Renal cyst 04/11/2015   Osteoporosis 01/21/2015   Rosacea 01/04/2015   Postherpetic neuralgia 04/06/2014    Past Surgical History:  Procedure Laterality Date   HIP SURGERY       OB History   No obstetric history on file.     No family history on file.  Social History   Tobacco Use   Smoking status: Former    Packs/day: 0.50    Types: Cigarettes    Start date: 04/01/1969    Quit date: 04/01/2008    Years since quitting: 12.6   Smokeless tobacco: Never  Vaping Use   Vaping Use: Never used  Substance Use Topics   Alcohol use: No    Alcohol/week: 0.0 standard drinks   Drug use: No  Home Medications Prior to Admission medications   Medication Sig Start Date End Date Taking? Authorizing Provider  atorvastatin (LIPITOR) 20 MG tablet TAKE 1 TABLET BY MOUTH EVERY DAY 11/04/20   Claretta Fraise, MD  Biotin 10000 MCG TABS Take by mouth.    [provider]  diltiazem (CARDIZEM CD) 120 MG 24 hr capsule Take 1 capsule (120 mg total) by mouth daily. For blood pressure 10/31/20   Claretta Fraise, MD  furosemide (LASIX) 40 MG tablet TAKE 1 TABLET BY MOUTH 2 TIMES DAILY FOR SWELLING 11/17/19   Claretta Fraise, MD  gabapentin (NEURONTIN) 800 MG tablet One in the Morning, one in the afternoon and two  at night 05/23/20   Claretta Fraise, MD  Iron, Ferrous Sulfate, 325 (65 Fe) MG TABS Take 325 mg by mouth daily. 05/23/20   Claretta Fraise, MD  LINZESS 290 MCG CAPS capsule TAKE 1 CAPSULE (290 MCG TOTAL) BY MOUTH DAILY. TO REGULATE BOWEL MOVEMENTS 01/08/20   Claretta Fraise, MD  methadone (DOLOPHINE) 10 MG tablet Take 1 tablet (10 mg total) by mouth 3 (three) times daily. 11/28/20   Claretta Fraise, MD  methadone (DOLOPHINE) 10 MG tablet Take 1 tablet (10 mg total) by mouth 3 (three) times daily. 10/29/20   Claretta Fraise, MD  methadone (DOLOPHINE) 10 MG tablet Take 1 tablet (10 mg total) by mouth 3 (three) times daily. 09/29/20 10/29/20  Claretta Fraise, MD  montelukast (SINGULAIR) 10 MG tablet TAKE 1 TABLET BY MOUTH EVERYDAY AT BEDTIME 07/06/20   Claretta Fraise, MD  nitroGLYCERIN (NITROSTAT) 0.4 MG SL tablet Place under the tongue. 05/17/15 07/12/16  [provider]  polyethylene glycol powder (GLYCOLAX/MIRALAX) 17 GM/SCOOP powder One capful in water 2 times daily 09/29/20   Claretta Fraise, MD  RABEprazole (ACIPHEX) 20 MG tablet TAKE 1 TABLET BY MOUTH EVERY DAY 04/12/20   Claretta Fraise, MD  RESTASIS 0.05 % ophthalmic emulsion  02/05/19   [provider]  XARELTO 10 MG TABS tablet Take 1 tablet (10 mg total) by mouth daily. 05/23/20   Claretta Fraise, MD    Allergies    Nsaids, Dexilant [dexlansoprazole], Pantoprazole, and Raloxifene  Review of Systems   Review of Systems  Constitutional:  Positive for fatigue. Negative for chills and fever.  HENT:  Negative for ear pain and sore throat.   Eyes:  Negative for pain and visual disturbance.  Respiratory:  Negative for cough and shortness of breath.   Cardiovascular:  Positive for leg swelling. Negative for chest pain and palpitations.  Gastrointestinal:  Negative for abdominal pain and vomiting.  Genitourinary:  Negative for dysuria and hematuria.  Musculoskeletal:  Negative for arthralgias and back pain.  Skin:  Negative for color change and  rash.  Neurological:  Negative for seizures and syncope.  All other systems reviewed and are negative.  Physical Exam Updated Vital Signs BP 126/71 (BP Location: Right Arm)   Pulse (!) 42   Resp (!) 24   SpO2 98%   Physical Exam Vitals and nursing note reviewed.  Constitutional:      General: She is not in acute distress.    Appearance: Normal appearance. She is well-developed. She is ill-appearing.  HENT:     Head: Normocephalic and atraumatic.     Mouth/Throat:     Mouth: Mucous membranes are dry.  Eyes:     Extraocular Movements: Extraocular movements intact.     Conjunctiva/sclera: Conjunctivae normal.     Pupils: Pupils are equal, round, and reactive to light.  Cardiovascular:  Rate and Rhythm: Normal rate and regular rhythm.     Heart sounds: No murmur heard. Pulmonary:     Effort: Pulmonary effort is normal. No respiratory distress.     Breath sounds: Normal breath sounds.  Abdominal:     Palpations: Abdomen is soft.     Tenderness: There is no abdominal tenderness.  Musculoskeletal:        General: Swelling present. Normal range of motion.     Cervical back: Normal range of motion and neck supple.  Skin:    General: Skin is warm and dry.     Coloration: Skin is pale.  Neurological:     General: No focal deficit present.     Mental Status: She is alert.    ED Results / Procedures / Treatments   Labs (all labs ordered are listed, but only abnormal results are displayed) Labs Reviewed  RESP PANEL BY RT-PCR (FLU A&B, COVID) [id number]  COMPREHENSIVE METABOLIC PANEL  CBC WITH DIFFERENTIAL/PLATELET  BRAIN NATRIURETIC PEPTIDE  TROPONIN I (HIGH SENSITIVITY)    EKG None  Radiology No results found.  Procedures Procedures   Medications Ordered in ED Medications - No data to display  ED Course  I have reviewed the triage vital signs and the nursing notes.  Pertinent labs & imaging results that were available during my care of the patient were reviewed  by me and considered in my medical decision making (see chart for details).    MDM Rules/Calculators/A&P                         CRITICAL CARE Performed by: Fredia Sorrow Total critical care time: 45 minutes Critical care time was exclusive of separately billable procedures and treating other patients. Critical care was necessary to treat or prevent imminent or life-threatening deterioration. Critical care was time spent personally by me on the following activities: development of treatment plan with patient and/or surrogate as well as nursing, discussions with consultants, evaluation of patient's response to treatment, examination of patient, obtaining history from patient or surrogate, ordering and performing treatments and interventions, ordering and review of laboratory studies, ordering and review of radiographic studies, pulse oximetry and re-evaluation of patient's condition.   Patient appears to be fluid overloaded.  Also patient's labs show that she is in acute kidney injury.  Seem a little elevated but not in the critical area of 5.3.  The creatinine is much worse than it was just at beginning of September.  Patient's hemoglobin today 7 and it was 6.9 when she was transfused at [facility] in September.  BNP is elevated chest x-ray consistent with congestive heart failure.  Patient received 80 mg of Lasix for that.  Patient been in a consistent rhythm for atrial fibrillation which she is known to have but has been kind of a bradycardia rate.  Is possible her card exam is gotten elevated do her renal function are better she accidentally took too much.  Her troponins are stable no significant change there.  I have ordered 2 units of blood transfusion for the anemia.  This will be a type growth with getting the blood and needing to lose fluid due to the CHF.  And then also following the bradycardia which could be contributing some to.  Discussed with hospitalist who will  admit.  Final Clinical Impression(s) / ED Diagnoses Final diagnoses:  None    Rx / DC Orders ED Discharge Orders     None  Fredia Sorrow, MD 11/17/20 [phone]

## 2020-11-17 NOTE — ED Triage Notes (Signed)
Pt sent by doctor for shortness of breath and fluid build-up.

## 2020-11-17 NOTE — ED Provider Notes (Signed)
Emergency Medicine Provider Triage Evaluation Note  Sheena Morrow , a 87 y.o. female  was evaluated in triage.  Pt complains of fluid overload.  Patient was seen in her doctor's office earlier today, sent to ED for presumptive admission for CHF, renal insufficiency and anasarca.  Patient is also feeling short of breath  Review of Systems  Positive: Anasarca, CHF, renal insufficiency Negative: Syncope  Physical Exam  BP 126/71 (BP Location: Right Arm)   Pulse (!) 42   Resp (!) 24   SpO2 98%  Gen:   Awake, no distress  Resp:  Normal effort  MSK:   Moves extremities without difficulty  Other:  Anasarca, crackles on auscultation.  Patient is bradycardic with tachypnea.  Medical Decision Making  Medically screening exam initiated at 5:44 PM.  Appropriate orders placed.  Sheena Morrow was informed that the remainder of the evaluation will be completed by another provider, this initial triage assessment does not replace that evaluation, and the importance of remaining in the ED until their evaluation is complete.  Needs room, patient to be worked up for CHF exacerbation, renal insufficiency and anasarca.   Sherrill Raring, PA-C 11/17/20 [place], [place], DO 11/18/20 1926

## 2020-11-18 ENCOUNTER — Encounter (HOSPITAL_COMMUNITY): Payer: Self-pay | Admitting: Internal Medicine

## 2020-11-18 ENCOUNTER — Inpatient Hospital Stay (HOSPITAL_COMMUNITY): Payer: Medicare HMO

## 2020-11-18 DIAGNOSIS — M7989 Other specified soft tissue disorders: Secondary | ICD-10-CM | POA: Diagnosis not present

## 2020-11-18 DIAGNOSIS — I5033 Acute on chronic diastolic (congestive) heart failure: Secondary | ICD-10-CM | POA: Diagnosis not present

## 2020-11-18 DIAGNOSIS — T68XXXA Hypothermia, initial encounter: Secondary | ICD-10-CM

## 2020-11-18 DIAGNOSIS — N189 Chronic kidney disease, unspecified: Secondary | ICD-10-CM

## 2020-11-18 DIAGNOSIS — I482 Chronic atrial fibrillation, unspecified: Secondary | ICD-10-CM

## 2020-11-18 DIAGNOSIS — L8990 Pressure ulcer of unspecified site, unspecified stage: Secondary | ICD-10-CM | POA: Insufficient documentation

## 2020-11-18 DIAGNOSIS — E43 Unspecified severe protein-calorie malnutrition: Secondary | ICD-10-CM | POA: Insufficient documentation

## 2020-11-18 LAB — CBC
HCT: 31.9 % — ABNORMAL LOW (ref 36.0–46.0)
Hemoglobin: 9.5 g/dL — ABNORMAL LOW (ref 12.0–15.0)
MCH: 25.9 pg — ABNORMAL LOW (ref 26.0–34.0)
MCHC: 29.8 g/dL — ABNORMAL LOW (ref 30.0–36.0)
MCV: 86.9 fL (ref 80.0–100.0)
Platelets: 299 10*3/uL (ref 150–400)
RBC: 3.67 MIL/uL — ABNORMAL LOW (ref 3.87–5.11)
RDW: 19.5 % — ABNORMAL HIGH (ref 11.5–15.5)
WBC: 7.7 10*3/uL (ref 4.0–10.5)
nRBC: 0 % (ref 0.0–0.2)

## 2020-11-18 LAB — PROCALCITONIN: Procalcitonin: 0.13 ng/mL

## 2020-11-18 LAB — BASIC METABOLIC PANEL
Anion gap: 10 (ref 5–15)
BUN: 39 mg/dL — ABNORMAL HIGH (ref 8–23)
CO2: 26 mmol/L (ref 22–32)
Calcium: 8.6 mg/dL — ABNORMAL LOW (ref 8.9–10.3)
Chloride: 97 mmol/L — ABNORMAL LOW (ref 98–111)
Creatinine, Ser: 1.91 mg/dL — ABNORMAL HIGH (ref 0.44–1.00)
GFR, Estimated: 25 mL/min — ABNORMAL LOW (ref >60)
Glucose, Bld: 77 mg/dL (ref 70–99)
Potassium: 4.5 mmol/L (ref 3.5–5.1)
Sodium: 133 mmol/L — ABNORMAL LOW (ref 135–145)

## 2020-11-18 LAB — URINALYSIS, COMPLETE (UACMP) WITH MICROSCOPIC
Bacteria, UA: NONE SEEN
Bilirubin Urine: NEGATIVE
Glucose, UA: NEGATIVE mg/dL
Hgb urine dipstick: NEGATIVE
Ketones, ur: NEGATIVE mg/dL
Leukocytes,Ua: NEGATIVE
Nitrite: NEGATIVE
Protein, ur: NEGATIVE mg/dL
Specific Gravity, Urine: 1.009 (ref 1.005–1.030)
pH: 7 (ref 5.0–8.0)

## 2020-11-18 LAB — T4, FREE: Free T4: 1.19 ng/dL — ABNORMAL HIGH (ref 0.61–1.12)

## 2020-11-18 LAB — HEMOGLOBIN AND HEMATOCRIT, BLOOD
HCT: 29.5 % — ABNORMAL LOW (ref 36.0–46.0)
Hemoglobin: 8.7 g/dL — ABNORMAL LOW (ref 12.0–15.0)

## 2020-11-18 LAB — TSH: TSH: 22.3 u[IU]/mL — ABNORMAL HIGH (ref 0.350–4.500)

## 2020-11-18 LAB — POC OCCULT BLOOD, ED: Fecal Occult Bld: POSITIVE — AB

## 2020-11-18 LAB — CORTISOL: Cortisol, Plasma: 23.2 ug/dL

## 2020-11-18 MED ORDER — METHADONE HCL 10 MG PO TABS
10.0000 mg | ORAL_TABLET | Freq: Three times a day (TID) | ORAL | Status: DC
Start: 2020-11-18 — End: 2020-11-27
  Administered 2020-11-18 – 2020-11-27 (×26): 10 mg via ORAL
  Filled 2020-11-18 (×27): qty 1

## 2020-11-18 MED ORDER — FERROUS SULFATE 325 (65 FE) MG PO TABS
325.0000 mg | ORAL_TABLET | Freq: Every day | ORAL | Status: DC
  Administered 2020-11-18 – 2020-11-21 (×4): 325 mg via ORAL
  Filled 2020-11-18 (×4): qty 1

## 2020-11-18 MED ORDER — DILTIAZEM HCL ER COATED BEADS 120 MG PO CP24
120.0000 mg | ORAL_CAPSULE | Freq: Every day | ORAL | Status: DC
Start: 2020-11-18 — End: 2020-11-18

## 2020-11-18 MED ORDER — FUROSEMIDE 10 MG/ML IJ SOLN
80.0000 mg | Freq: Two times a day (BID) | INTRAMUSCULAR | Status: DC
  Administered 2020-11-18 (×2): 80 mg via INTRAVENOUS
  Filled 2020-11-18 (×2): qty 8

## 2020-11-18 MED ORDER — GABAPENTIN 400 MG PO CAPS
1600.0000 mg | ORAL_CAPSULE | Freq: Every day | ORAL | Status: DC
  Administered 2020-11-18 – 2020-11-26 (×9): 1600 mg via ORAL
  Filled 2020-11-18 (×9): qty 4

## 2020-11-18 MED ORDER — RIVAROXABAN 10 MG PO TABS: 10.0000 mg | ORAL_TABLET | Freq: Every day | ORAL | Status: DC

## 2020-11-18 MED ORDER — PANTOPRAZOLE SODIUM 40 MG PO TBEC
40.0000 mg | DELAYED_RELEASE_TABLET | Freq: Every day | ORAL | Status: DC
  Administered 2020-11-18 – 2020-11-27 (×10): 40 mg via ORAL
  Filled 2020-11-18 (×10): qty 1

## 2020-11-18 MED ORDER — CHLORHEXIDINE GLUCONATE CLOTH 2 % EX PADS
6.0000 | MEDICATED_PAD | Freq: Every day | CUTANEOUS | Status: DC
  Administered 2020-11-19 – 2020-11-25 (×6): 6 via TOPICAL

## 2020-11-18 MED ORDER — ATORVASTATIN CALCIUM 10 MG PO TABS
20.0000 mg | ORAL_TABLET | Freq: Every day | ORAL | Status: DC
  Administered 2020-11-18 – 2020-11-21 (×4): 20 mg via ORAL
  Filled 2020-11-18 (×4): qty 2

## 2020-11-18 MED ORDER — GABAPENTIN 400 MG PO CAPS
800.0000 mg | ORAL_CAPSULE | Freq: Two times a day (BID) | ORAL | Status: DC
  Administered 2020-11-19 – 2020-11-27 (×17): 800 mg via ORAL
  Filled 2020-11-18 (×16): qty 2

## 2020-11-18 MED ORDER — LINACLOTIDE 145 MCG PO CAPS
290.0000 ug | ORAL_CAPSULE | Freq: Every day | ORAL | Status: DC
  Administered 2020-11-19 – 2020-11-21 (×3): 290 ug via ORAL
  Filled 2020-11-18 (×3): qty 2

## 2020-11-18 MED ORDER — MONTELUKAST SODIUM 10 MG PO TABS
10.0000 mg | ORAL_TABLET | Freq: Every day | ORAL | Status: DC
  Administered 2020-11-18 – 2020-11-26 (×9): 10 mg via ORAL
  Filled 2020-11-18 (×9): qty 1

## 2020-11-18 MED ORDER — ENSURE ENLIVE PO LIQD
237.0000 mL | Freq: Two times a day (BID) | ORAL | Status: DC
  Administered 2020-11-18 – 2020-11-27 (×15): 237 mL via ORAL

## 2020-11-18 NOTE — Progress Notes (Addendum)
PROGRESS NOTE    Rebakah Cokley  DXA:[id number] DOB: August 12, 1933 DOA: 11/17/2020 PCP: Claretta Fraise, MD    Brief Narrative:  Patient is a 87 year old female with past medical history of CAD, CHFpEF, chronic atrial fibrillation, remote CVA, chronic bilateral lower extremity venous insufficiency with venous stasis dermatitis, colonic AVMs with chronic GI bleed and anemia who declined EGD and colonoscopy at outside hospital on admission in September and the decision was made to stop anticoagulation going forward given her ongoing GI blood loss and anemia.  Patient was sent to the ED after being seen by her primary care physician with anasarca and oliguria. Patient endorsing dyspnea, orthopnea, and bilateral lower extremity edema. She stopped taking Lasix since after her hospitalization in early September.  Patient is admitted for exacerbation of CHF.   Assessment & Plan:   Principal Problem:   CHF exacerbation ([place]) Active Problems:   Acute urinary retention   Acute-on-chronic kidney injury ([place])   Atrial fibrillation, chronic (H[facility])   Hypothermia   Acute on chronic diastolic CHF Appears significantly volume overloaded on exam with JVD, rales, and bilateral lower extremity edema.  BNP elevated at 687.  Chest x-ray showing pulmonary edema.   Patient is not hypoxic.   Last echo done 07/23/2020 ([facility]) showing EF 60 to 65% and mild MR.  Creatinine stable on repeat labs.   Continue IV Lasix 80 mg twice daily.   Monitor intake and output, daily weights.   Low-sodium diet with fluid restriction.   AKI on CKD stage IIIb Likely cardiorenal in the setting of acutely decompensated CHF.  BUN 39, creatinine 2.0 (baseline around 1.4).  Creatinine 1.9 on repeat labs. -Continue IV diuresis and monitor renal function closely.   Avoid nephrotoxic agents.   Acute urinary retention Patient did not urinate in the ED and bladder scan revealed 559 cc.  In-N-Out cath done. -Continue bladder scans  every 4 hours   Chronic A. fib with slow ventricular response Heart rate in the 40s to 50s, blood pressure stable. -Hold home Cardizem.  No longer on Xarelto due to chronic blood loss anemia.   Hypothermia Likely due to hypothyroidism.  TSH significantly elevated at 22.3.  Infection less likely given no leukocytosis and procalcitonin 0.13.  UA without signs of infection.  Cortisol normal.   Free T4 is elevated at 1.19   Chronic blood loss anemia Admitted twice in July and September at outside facility and required multiple blood transfusions during these hospitalizations.  She declined EGD and colonoscopy.  GI at outside hospital felt that her anemia was related to chronic blood loss from small bowel AVMs and Xarelto was discontinued.  Hemoglobin 7.2 and FOBT positive.  Patient received 2 units PRBCs in the ED.  Repeat hemoglobin 8.7. -Continue to monitor   Borderline hyperkalemia (resolved) Potassium normal on repeat labs. -Continue to monitor   Severe malnutrition/hypoalbuminemia Also likely making edema worse due to third spacing. -Nutrition consult   Mild transaminitis Likely due to decompensated CHF. -Continue to monitor   CAD Troponin elevation Troponin elevation likely due to demand ischemia from decompensated CHF. High-sensitivity troponin elevated but stable (119 > 116).  Patient is not endorsing any chest pain.   Chronic bilateral lower extremity venous insufficiency/lymphedema L>R -Dopplers ordered to rule out DVT   History of CVA Chronic pain syndrome -Resume home meds after pharmacy med rec is done.   Pressure ulcer Consider consulting wound team if needed  DVT prophylaxis: SCD/Compression stockings Code Status: Full code  Family Communication: Patient  at bedside Disposition Plan: Home patient remains inpatient due to need for ongoing diuresis and monitoring of renal function.   Consultants:  None  Procedures: Lower extremity  Dopplers  Antimicrobials: Anti-infectives (From admission, onward)    None        Subjective: Patient reports significant pain in her lower extremities bilaterally.  Objective: Vitals:   11/18/20 0906 11/18/20 0915 11/18/20 0930 11/18/20 1200  BP:  110/60 (!) 119/58 140/77  Pulse:  (!) 49 (!) 40   Resp:  17 19 (!) 24  Temp: 97.8 F (36.6 C)     TempSrc: Oral     SpO2:  95% 93%     Intake/Output Summary (Last 24 hours) at 11/18/2020 1304 Last data filed at 11/18/2020 0355 Gross per 24 hour  Intake 945 ml  Output 500 ml  Net 445 ml   There were no vitals filed for this visit.  Examination:  General exam: Appears calm and comfortable  Respiratory system:  Respiratory effort normal.  Positive Rales at bases Cardiovascular system: S1 & S2 heard, RRR.  Positive JVD Gastrointestinal system: Abdomen is nondistended, soft and nontender.  Central nervous system: Alert and oriented. No focal neurological deficits. Extremities: Chronic venous stasis changes, 3+ pitting edema Psychiatry: Judgement and insight appear normal. Mood & affect appropriate.     Data Reviewed: I have personally reviewed following labs and imaging studies  CBC: Recent Labs  Lab 11/17/20 1744 11/18/20 0258 11/18/20 0911  WBC 7.4  --  7.7  NEUTROABS 4.9  --   --   HGB 7.2* 8.7* 9.5*  HCT 25.3* 29.5* 31.9*  MCV 89.1  --  86.9  PLT 328  --  322   Basic Metabolic Panel: Recent Labs  Lab 11/17/20 1744 11/18/20 0258  NA 133* 133*  K 5.3* 4.5  CL 95* 97*  CO2 25 26  GLUCOSE 77 77  BUN 39* 39*  CREATININE 2.03* 1.91*  CALCIUM 8.7* 8.6*   GFR: CrCl cannot be calculated (Unknown ideal weight.). Liver Function Tests: Recent Labs  Lab 11/17/20 1744  AST 187*  ALT 81*  ALKPHOS 95  BILITOT 0.7  PROT 7.6  ALBUMIN 1.8*   Thyroid Function Tests: Recent Labs    11/18/20 0258 11/18/20 0300  TSH 22.300*  --   FREET4  --  1.19*    Sepsis Labs: Recent Labs  Lab 11/18/20 0258   PROCALCITON 0.13    Recent Results (from the past 240 hour(s))  Resp Panel by RT-PCR (Flu A&B, Covid) Nasopharyngeal Swab     Status: None   Collection Time: 11/17/20  5:44 PM   Specimen: Nasopharyngeal Swab; Nasopharyngeal(NP) swabs in vial transport medium  Result Value Ref Range Status   SARS Coronavirus 2 by RT PCR NEGATIVE NEGATIVE Final    Comment: (NOTE) SARS-CoV-2 target nucleic acids are NOT DETECTED.  The SARS-CoV-2 RNA is generally detectable in upper respiratory specimens during the acute phase of infection. The lowest concentration of SARS-CoV-2 viral copies this assay can detect is 138 copies/mL. A negative result does not preclude SARS-Cov-2 infection and should not be used as the sole basis for treatment or other patient management decisions. A negative result may occur with  improper specimen collection/handling, submission of specimen other than nasopharyngeal swab, presence of viral mutation(s) within the areas targeted by this assay, and inadequate number of viral copies(<138 copies/mL). A negative result must be combined with clinical observations, patient history, and epidemiological information. The expected result is Negative.  Fact Sheet  for Patients:  EntrepreneurPulse.com.au  Fact Sheet for Healthcare Providers:  IncredibleEmployment.be  This test is no t yet approved or cleared by the [place] FDA and  has been authorized for detection and/or diagnosis of SARS-CoV-2 by FDA under an Emergency Use Authorization (EUA). This EUA will remain  in effect (meaning this test can be used) for the duration of the COVID-19 declaration under Section 564(b)(1) of the Act, 21 U.S.C.section 360bbb-3(b)(1), unless the authorization is terminated  or revoked sooner.       Influenza A by PCR NEGATIVE NEGATIVE Final   Influenza B by PCR NEGATIVE NEGATIVE Final    Comment: (NOTE) The Xpert Xpress SARS-CoV-2/FLU/RSV plus  assay is intended as an aid in the diagnosis of influenza from Nasopharyngeal swab specimens and should not be used as a sole basis for treatment. Nasal washings and aspirates are unacceptable for Xpert Xpress SARS-CoV-2/FLU/RSV testing.  Fact Sheet for Patients: EntrepreneurPulse.com.au  Fact Sheet for Healthcare Providers: IncredibleEmployment.be  This test is not yet approved or cleared by the [place] FDA and has been authorized for detection and/or diagnosis of SARS-CoV-2 by FDA under an Emergency Use Authorization (EUA). This EUA will remain in effect (meaning this test can be used) for the duration of the COVID-19 declaration under Section 564(b)(1) of the Act, 21 U.S.C. section 360bbb-3(b)(1), unless the authorization is terminated or revoked.  Performed at [facility], [place] [address] [place], [place] [address]       Radiology Studies: DG Chest Port 1 View  Result Date: 11/17/2020 CLINICAL DATA:  Short of breath EXAM: PORTABLE CHEST 1 VIEW COMPARISON:  04/13/2016 FINDINGS: Single frontal view of the chest demonstrates an enlarged cardiac silhouette. There is increased vascular congestion with bilateral interstitial and ground-glass opacities most consistent with edema. No large effusion or pneumothorax. No acute bony abnormality. IMPRESSION: 1. Findings most consistent with congestive heart failure. Electronically Signed   By: Randa Ngo M.D.   On: 11/17/2020 19:14     Scheduled Meds:  furosemide  80 mg Intravenous BID   Continuous Infusions:   LOS: 1 day    Donnamae Jude, MD 11/18/2020 1:04 PM [phone] [facility] If 7PM-7AM, please contact night-coverage 11/18/2020, 1:04 PM

## 2020-11-18 NOTE — H&P (Signed)
History and Physical    Sheena Morrow UTM:[id number] DOB: 02-21-1933 DOA: 11/17/2020  PCP: Claretta Fraise, MD Patient coming from: Home  Chief Complaint: Shortness of breath  HPI: Sheena Morrow is a 87 y.o. female with medical history significant of CAD, chronic diastolic CHF, chronic A. fib, remote CVA, CKD stage IIIb, chronic bilateral lower extremity venous insufficiency/lymphedema L>R, chronic venous stasis dermatitis, colonic AVMs, hemorrhoids, chronic blood loss anemia, postherpetic neuralgia, chronic pain on methadone, chronic constipation.  Per Care Everywhere, admitted in July 2022 at outside facility for septic shock due to C. difficile colitis status post FMT, hospitalization complicated by GI bleeding/acute on chronic blood loss anemia requiring 9 units of RBC transfusion.  Admitted again in September for anemia.  Patient declined EGD and colonoscopy.  GI felt that her anemia was related to chronic blood loss from small bowel AVMs in the setting of anticoagulation for A. fib.  Required 2 units of RBC transfusion during this hospitalization and decision was made to discontinue anticoagulation moving forward.  Patient went to her PCPs office today for evaluation of dyspnea, anasarca, and oliguria.  Sent to the ED for due to concern for decompensated CHF.  In the ED, in A. fib with slow ventricular response (heart rate in the 40s).  Not hypoxic.  Hypothermic with rectal temperature 95.6 F.  Labs notable for WBC 7.4, hemoglobin 7.2, platelet count 328k.  Sodium 133, potassium 5.3, chloride 95, bicarb 25, BUN 39, creatinine 2.0 (baseline around 1.4), glucose 77.  Albumin 1.8.  AST 187, ALT 81.  Alk phos and T bili normal.  BNP 687.  COVID and influenza PCR negative.  High-sensitivity troponin elevated but stable (119 > 116).  FOBT positive.  Chest x-ray showing pulmonary edema. Patient was given IV Lasix 80 mg and 2 units PRBCs.  Patient reports 1 week history of shortness of breath and  orthopnea.  Her legs are very swollen.  She thinks her symptoms are due to asthma.  She stopped taking Lasix after her hospital admission in early September.  She is making very little urine.  She has not urinated after receiving Lasix in the ED.  Denies chest pain.  Denies increased dietary fluid or sodium intake.  Denies nausea, vomiting, abdominal pain, diarrhea, or dysuria.  No additional history could be obtained from her.  Review of Systems:  All systems reviewed and apart from history of presenting illness, are negative.  Past Medical History:  Diagnosis Date   Clotting disorder ([place])    Constipation due to opioid therapy    Old MI (myocardial infarction) 07/21/2015   Osteoporosis    Postherpetic neuralgia    Systolic CHF with reduced left ventricular function, NYHA class 2 ([place]) 05/21/2015    Past Surgical History:  Procedure Laterality Date   HIP SURGERY       reports that she quit smoking about 12 years ago. Her smoking use included cigarettes. She started smoking about 51 years ago. She smoked an average of .5 packs per day. She has never used smokeless tobacco. She reports that she does not drink alcohol and does not use drugs.  Allergies  Allergen Reactions   Nsaids Other (See Comments)    GI Bleed   Dexilant [Dexlansoprazole] Diarrhea   Pantoprazole    Raloxifene     Other reaction(s): Cramps (ALLERGY/intolerance)    History reviewed. No pertinent family history.  Prior to Admission medications   Medication Sig Start Date End Date Taking? Authorizing Provider  atorvastatin (LIPITOR) 20 MG  tablet TAKE 1 TABLET BY MOUTH EVERY DAY 11/04/20   Claretta Fraise, MD  Biotin 10000 MCG TABS Take by mouth.    [provider]  diltiazem (CARDIZEM CD) 120 MG 24 hr capsule Take 1 capsule (120 mg total) by mouth daily. For blood pressure 10/31/20   Claretta Fraise, MD  furosemide (LASIX) 40 MG tablet TAKE 1 TABLET BY MOUTH 2 TIMES DAILY FOR SWELLING 11/17/19   Claretta Fraise,  MD  gabapentin (NEURONTIN) 800 MG tablet One in the Morning, one in the afternoon and two at night 05/23/20   Claretta Fraise, MD  Iron, Ferrous Sulfate, 325 (65 Fe) MG TABS Take 325 mg by mouth daily. 05/23/20   Claretta Fraise, MD  LINZESS 290 MCG CAPS capsule TAKE 1 CAPSULE (290 MCG TOTAL) BY MOUTH DAILY. TO REGULATE BOWEL MOVEMENTS 01/08/20   Claretta Fraise, MD  methadone (DOLOPHINE) 10 MG tablet Take 1 tablet (10 mg total) by mouth 3 (three) times daily. 11/28/20   Claretta Fraise, MD  methadone (DOLOPHINE) 10 MG tablet Take 1 tablet (10 mg total) by mouth 3 (three) times daily. 10/29/20   Claretta Fraise, MD  methadone (DOLOPHINE) 10 MG tablet Take 1 tablet (10 mg total) by mouth 3 (three) times daily. 09/29/20 10/29/20  Claretta Fraise, MD  montelukast (SINGULAIR) 10 MG tablet TAKE 1 TABLET BY MOUTH EVERYDAY AT BEDTIME 07/06/20   Claretta Fraise, MD  nitroGLYCERIN (NITROSTAT) 0.4 MG SL tablet Place under the tongue. 05/17/15 07/12/16  [provider]  polyethylene glycol powder (GLYCOLAX/MIRALAX) 17 GM/SCOOP powder One capful in water 2 times daily 09/29/20   Claretta Fraise, MD  RABEprazole (ACIPHEX) 20 MG tablet TAKE 1 TABLET BY MOUTH EVERY DAY 04/12/20   Claretta Fraise, MD  RESTASIS 0.05 % ophthalmic emulsion  02/05/19   [provider]  XARELTO 10 MG TABS tablet Take 1 tablet (10 mg total) by mouth daily. 05/23/20   Claretta Fraise, MD    Physical Exam: Vitals:   11/18/20 0552 11/18/20 0600 11/18/20 0640 11/18/20 0700  BP:  (!) 108/55 (!) 109/53 (!) 114/52  Pulse:  (!) 53 (!) 53 (!) 47  Resp:  16 15 (!) 23  Temp: (!) 97.5 F (36.4 C)  97.6 F (36.4 C)   TempSrc: Oral     SpO2:  94% 97% 94%    Physical Exam Constitutional:      General: She is not in acute distress. HENT:     Head: Normocephalic and atraumatic.  Eyes:     Extraocular Movements: Extraocular movements intact.     Conjunctiva/sclera: Conjunctivae normal.  Neck:     Comments: +JVD Cardiovascular:     Rate and  Rhythm: Normal rate and regular rhythm.     Pulses: Normal pulses.  Pulmonary:     Breath sounds: No wheezing.     Comments: Rales up to mid lung fields bilaterally Abdominal:     General: Bowel sounds are normal.     Palpations: Abdomen is soft.  Musculoskeletal:        General: No tenderness.     Cervical back: Normal range of motion and neck supple.     Comments: +4 pitting edema of bilateral lower extremities  Skin:    General: Skin is warm and dry.  Neurological:     General: No focal deficit present.     Mental Status: She is alert and oriented to person, place, and time.     Labs on Admission: I have personally reviewed following labs and  imaging studies  CBC: Recent Labs  Lab 11/17/20 1744 11/18/20 0258  WBC 7.4  --   NEUTROABS 4.9  --   HGB 7.2* 8.7*  HCT 25.3* 29.5*  MCV 89.1  --   PLT 328  --    Basic Metabolic Panel: Recent Labs  Lab 11/17/20 1744 11/18/20 0258  NA 133* 133*  K 5.3* 4.5  CL 95* 97*  CO2 25 26  GLUCOSE 77 77  BUN 39* 39*  CREATININE 2.03* 1.91*  CALCIUM 8.7* 8.6*   GFR: CrCl cannot be calculated (Unknown ideal weight.). Liver Function Tests: Recent Labs  Lab 11/17/20 1744  AST 187*  ALT 81*  ALKPHOS 95  BILITOT 0.7  PROT 7.6  ALBUMIN 1.8*   No results for input(s): LIPASE, AMYLASE in the last 168 hours. No results for input(s): AMMONIA in the last 168 hours. Coagulation Profile: No results for input(s): INR, PROTIME in the last 168 hours. Cardiac Enzymes: No results for input(s): CKTOTAL, CKMB, CKMBINDEX, TROPONINI in the last 168 hours. BNP (last 3 results) No results for input(s): PROBNP in the last 8760 hours. HbA1C: No results for input(s): HGBA1C in the last 72 hours. CBG: No results for input(s): GLUCAP in the last 168 hours. Lipid Profile: No results for input(s): CHOL, HDL, LDLCALC, TRIG, CHOLHDL, LDLDIRECT in the last 72 hours. Thyroid Function Tests: Recent Labs    11/18/20 0258  TSH 22.300*   Anemia  Panel: No results for input(s): VITAMINB12, FOLATE, FERRITIN, TIBC, IRON, RETICCTPCT in the last 72 hours. Urine analysis:    Component Value Date/Time   COLORURINE YELLOW 11/18/2020 0258   APPEARANCEUR CLEAR 11/18/2020 0258   LABSPEC 1.009 11/18/2020 0258   PHURINE 7.0 11/18/2020 0258   GLUCOSEU NEGATIVE 11/18/2020 0258   HGBUR NEGATIVE 11/18/2020 0258   BILIRUBINUR NEGATIVE 11/18/2020 0258   BILIRUBINUR negative 01/04/2015 [place] 11/18/2020 0258   PROTEINUR NEGATIVE 11/18/2020 0258   UROBILINOGEN negative 01/04/2015 1755   NITRITE NEGATIVE 11/18/2020 0258   LEUKOCYTESUR NEGATIVE 11/18/2020 0258    Radiological Exams on Admission: DG Chest Port 1 View  Result Date: 11/17/2020 CLINICAL DATA:  Short of breath EXAM: PORTABLE CHEST 1 VIEW COMPARISON:  04/13/2016 FINDINGS: Single frontal view of the chest demonstrates an enlarged cardiac silhouette. There is increased vascular congestion with bilateral interstitial and ground-glass opacities most consistent with edema. No large effusion or pneumothorax. No acute bony abnormality. IMPRESSION: 1. Findings most consistent with congestive heart failure. Electronically Signed   By: Randa Ngo M.D.   On: 11/17/2020 19:14    EKG: Independently reviewed.  A. fib with slow ventricular response, low voltage.  Assessment/Plan Principal Problem:   CHF exacerbation (HCC) Active Problems:   Acute urinary retention   Acute-on-chronic kidney injury ([place])   Atrial fibrillation, chronic (HCC)   Hypothermia   Acute on chronic diastolic CHF Patient endorsing dyspnea, orthopnea, and bilateral lower extremity edema.  She stopped taking Lasix since after her hospitalization in early September.  Appears significantly volume overloaded on exam with JVD, rales, and bilateral lower extremity edema.  BNP elevated at 687.  Chest x-ray showing pulmonary edema.  Patient is not hypoxic. Echo done 07/23/2020 ([facility]where) showing EF 60 to  65% and mild MR. -IV Lasix 80 mg x 1 administered in the ED.  Creatinine stable on repeat labs.  559 cc urine output so far.  Continue IV Lasix 80 mg twice daily.  Monitor intake and output, daily weights.  Low-sodium diet with fluid  restriction.  AKI on CKD stage IIIb Likely cardiorenal in the setting of acutely decompensated CHF.  BUN 39, creatinine 2.0 (baseline around 1.4).  Creatinine 1.9 on repeat labs. -Continue IV diuresis and monitor renal function closely.  Avoid nephrotoxic agents.  Acute urinary retention Patient did not urinate in the ED and bladder scan revealed 559 cc.  In-N-Out cath done. -Continue bladder scans every 4 hours  Chronic A. fib with slow ventricular response Heart rate in the 40s to 50s, blood pressure stable. -Hold home Cardizem.  No longer on Xarelto due to chronic blood loss anemia.  Hypothermia Likely due to hypothyroidism.  TSH significantly elevated at 22.3.  Infection less likely given no leukocytosis and procalcitonin 0.13.  UA without signs of infection.  Cortisol normal.   -Initially placed on Bair hugger and temperature now improved.  Check free T4 level.  Blood cultures drawn.  Chronic blood loss anemia Admitted twice in July and September at outside facility and required multiple blood transfusions during these hospitalizations.  She declined EGD and colonoscopy.  GI at outside hospital felt that her anemia was related to chronic blood loss from small bowel AVMs and Xarelto was discontinued.  Hemoglobin 7.2 and FOBT positive.  Patient received 2 units PRBCs in the ED.  Repeat hemoglobin 8.7. -Continue to monitor  Borderline hyperkalemia (resolved) Potassium normal on repeat labs. -Continue to monitor  Hypoalbuminemia Also likely making edema worse due to third spacing. -Nutrition consult  Mild transaminitis Likely due to decompensated CHF. -Continue to monitor  CAD Troponin elevation Troponin elevation likely due to demand ischemia from  decompensated CHF. High-sensitivity troponin elevated but stable (119 > 116).  Patient is not endorsing any chest pain.  Chronic bilateral lower extremity venous insufficiency/lymphedema L>R -Dopplers ordered to rule out DVT  History of CVA Chronic pain syndrome -Resume home meds after pharmacy med rec is done.  DVT prophylaxis: No chemical DVT prophylaxis due to chronic GI bleed.  No SCDs at this time as lower extremity Dopplers pending. Code Status: Patient wishes to be full code. Family Communication: Discussed with the patient.  No family available at this time. Disposition Plan: Status is: Inpatient  Remains inpatient appropriate because: Needs IV diuresis for significant volume overload/decompensated CHF  Level of care: Level of care: Telemetry Cardiac  The medical decision making on this patient was of high complexity and the patient is at high risk for clinical deterioration, therefore this is a level 3 visit.  Shela Leff MD [facility]  If 7PM-7AM, please contact night-coverage [email or web address]  11/18/2020, 7:39 AM

## 2020-11-18 NOTE — Progress Notes (Signed)
Initial Nutrition Assessment  DOCUMENTATION CODES:   Severe malnutrition in context of chronic illness  INTERVENTION:   - Liberalize diet to 2 gram sodium to provide more food options, verbal with readback order placed per MD  - Ensure Enlive po BID, each supplement provides 350 kcal and 20 grams of protein  - Magic Cup TID with meals, each supplement provides 290 kcal and 9 grams of protein  - Encourage PO intake and provide feeding assistance as needed  NUTRITION DIAGNOSIS:   Severe Malnutrition related to chronic illness (CHF, CKD stage IIIb) as evidenced by severe fat depletion, severe muscle depletion.  GOAL:   Patient will meet greater than or equal to 90% of their needs  MONITOR:   PO intake, Supplement acceptance, Labs, Weight trends, I & O's  REASON FOR ASSESSMENT:   Consult Assessment of nutrition requirement/status  ASSESSMENT:   87 year old female who presented to the ED on 10/20 with SOB. PMH of CAD, CHF, atrial fibrillation, CVA, CKD stage IIIb, chronic BLE venous insufficiency/lymphedema L>R, chronic venous stasis dermatitis, colonic AVMs, hemorrhoids, chronic blood loss anemia, postherpetic neuralgia, chronic pain on methadone, chronic constipation, CHF. Pt admitted with acute on chronic CHF, AKI on CKD stage IIIb.  RD attempted to speak with pt at bedside. Pt woke only briefly during RD visit to state that her legs were hurting. Unable to obtain diet and weight history from pt at this time.  Discussed pt with RN who reports pt did not want to eat lunch earlier because she did not want to sit up in bed. RN reports that pt told her that she lives alone and cares for herself.  Reviewed weight history in chart. Pt with a weight loss of 3.2 kg since 11/05/19. This is a 5.4% weight loss which is not significant for timeframe. However, pt currently with edema to BLE so unsure of dry weight at this time. Pt diuresing with IV lasix.  Pt meets criteria for  malnutrition. RD to order oral nutrition supplements to aid pt in meeting kcal and protein needs.  Medications reviewed and include: IV lasix, ferrous sulfate, linzess, methadone, protonix  Labs reviewed: sodium 133, BUN 39, creatinine 1.91, hemoglobin 9.5  NUTRITION - FOCUSED PHYSICAL EXAM:  Flowsheet Row Most Recent Value  Orbital Region Severe depletion  Upper Arm Region Severe depletion  Thoracic and Lumbar Region Severe depletion  Buccal Region Moderate depletion  Temple Region Moderate depletion  Clavicle Bone Region Severe depletion  Clavicle and Acromion Bone Region Severe depletion  Scapular Bone Region Severe depletion  Dorsal Hand Moderate depletion  Patellar Region Unable to assess  [edema]  Anterior Thigh Region Moderate depletion  Posterior Calf Region Unable to assess  [edema]  Edema (RD Assessment) Moderate  [BLE]  Hair Reviewed  Eyes Unable to assess  Mouth Unable to assess  Skin Reviewed  Nails Reviewed       Diet Order:   Diet Order             Diet 2 gram sodium Room service appropriate? Yes; Fluid consistency: Thin  Diet effective now                   EDUCATION NEEDS:   Not appropriate for education at this time  Skin:  Skin Assessment: Reviewed RN Assessment  Last BM:  no documented BM  Height:   Ht Readings from Last 1 Encounters:  09/29/20 5\' 1"  (1.549 m)    Weight:   Wt Readings from Last 1  Encounters:  09/29/20 56.2 kg    BMI:  23.43  Estimated Nutritional Needs:   Kcal:  1550-1750  Protein:  75-85 grams  Fluid:  1.5-1.7 L    Gustavus Bryant, MS, RD, LDN Inpatient Clinical Dietitian Please see AMiON for contact information.

## 2020-11-18 NOTE — ED Notes (Signed)
Bairhugger applied. 38 C

## 2020-11-18 NOTE — Progress Notes (Signed)
Bilateral lower extremity venous duplex has been completed. Preliminary results can be found in CV Proc through chart review.   11/18/20 1:35 PM Sheena Morrow RVT

## 2020-11-18 NOTE — ED Notes (Signed)
Bairhugger removed per pt request. Oral temperature increased to normal range.

## 2020-11-18 NOTE — Progress Notes (Signed)
Paged MD about patients request for home doses of methadone, gabapentin - no orders noted for these in [facility]. Patient states pain in bilateral legs is 7/10, RN repositioned them on pillows. No return phone call at this time.

## 2020-11-18 NOTE — ED Notes (Signed)
Lunch ordered 

## 2020-11-19 ENCOUNTER — Other Ambulatory Visit (HOSPITAL_COMMUNITY): Payer: Medicare HMO

## 2020-11-19 ENCOUNTER — Inpatient Hospital Stay (HOSPITAL_COMMUNITY): Payer: Medicare HMO

## 2020-11-19 DIAGNOSIS — R7881 Bacteremia: Secondary | ICD-10-CM

## 2020-11-19 DIAGNOSIS — A0472 Enterocolitis due to Clostridium difficile, not specified as recurrent: Secondary | ICD-10-CM

## 2020-11-19 DIAGNOSIS — I5033 Acute on chronic diastolic (congestive) heart failure: Secondary | ICD-10-CM | POA: Diagnosis not present

## 2020-11-19 DIAGNOSIS — N179 Acute kidney failure, unspecified: Secondary | ICD-10-CM | POA: Diagnosis not present

## 2020-11-19 LAB — TYPE AND SCREEN
ABO/RH(D): O POS
Antibody Screen: NEGATIVE
Unit division: 0
Unit division: 0

## 2020-11-19 LAB — COMPREHENSIVE METABOLIC PANEL
ALT: 82 U/L — ABNORMAL HIGH (ref 0–44)
AST: 144 U/L — ABNORMAL HIGH (ref 15–41)
Albumin: <1.5 g/dL — ABNORMAL LOW (ref 3.5–5.0)
Alkaline Phosphatase: 91 U/L (ref 38–126)
Anion gap: 10 (ref 5–15)
BUN: 43 mg/dL — ABNORMAL HIGH (ref 8–23)
CO2: 28 mmol/L (ref 22–32)
Calcium: 8.1 mg/dL — ABNORMAL LOW (ref 8.9–10.3)
Chloride: 97 mmol/L — ABNORMAL LOW (ref 98–111)
Creatinine, Ser: 2.2 mg/dL — ABNORMAL HIGH (ref 0.44–1.00)
GFR, Estimated: 21 mL/min — ABNORMAL LOW (ref >60)
Glucose, Bld: 107 mg/dL — ABNORMAL HIGH (ref 70–99)
Potassium: 3.7 mmol/L (ref 3.5–5.1)
Sodium: 135 mmol/L (ref 135–145)
Total Bilirubin: 0.7 mg/dL (ref 0.3–1.2)
Total Protein: 6.6 g/dL (ref 6.5–8.1)

## 2020-11-19 LAB — BLOOD CULTURE ID PANEL (REFLEXED) - BCID2

## 2020-11-19 LAB — ECHOCARDIOGRAM COMPLETE
AV Mean grad: 7 mmHg
AV Peak grad: 12.4 mmHg
Ao pk vel: 1.76 m/s
MV M vel: 5.31 m/s
MV Peak grad: 112.8 mmHg
Radius: 0.4 cm
S' Lateral: 3.3 cm
Weight: 2141.11 oz

## 2020-11-19 LAB — CBC
HCT: 32.1 % — ABNORMAL LOW (ref 36.0–46.0)
Hemoglobin: 10 g/dL — ABNORMAL LOW (ref 12.0–15.0)
MCH: 26.5 pg (ref 26.0–34.0)
MCHC: 31.2 g/dL (ref 30.0–36.0)
MCV: 84.9 fL (ref 80.0–100.0)
Platelets: 335 10*3/uL (ref 150–400)
RBC: 3.78 MIL/uL — ABNORMAL LOW (ref 3.87–5.11)
RDW: 19.5 % — ABNORMAL HIGH (ref 11.5–15.5)
WBC: 9.8 10*3/uL (ref 4.0–10.5)
nRBC: 0.2 % (ref 0.0–0.2)

## 2020-11-19 LAB — BPAM RBC
Blood Product Expiration Date: 202211172359
Blood Product Expiration Date: 202211172359
ISSUE DATE / TIME: 202210202338
ISSUE DATE / TIME: 202210210316
Unit Type and Rh: 5100
Unit Type and Rh: 5100

## 2020-11-19 MED ORDER — FUROSEMIDE 10 MG/ML IJ SOLN
60.0000 mg | Freq: Two times a day (BID) | INTRAMUSCULAR | Status: DC
  Administered 2020-11-19 – 2020-11-27 (×17): 60 mg via INTRAVENOUS
  Filled 2020-11-19 (×17): qty 6

## 2020-11-19 MED ORDER — SODIUM CHLORIDE 0.9 % IV SOLN
2.0000 g | INTRAVENOUS | Status: DC
  Administered 2020-11-19: 2 g via INTRAVENOUS
  Filled 2020-11-19: qty 20

## 2020-11-19 MED ORDER — FUROSEMIDE 10 MG/ML IJ SOLN: 40.0000 mg | Freq: Two times a day (BID) | INTRAMUSCULAR | Status: DC

## 2020-11-19 MED ORDER — ACETAMINOPHEN 325 MG PO TABS
650.0000 mg | ORAL_TABLET | Freq: Four times a day (QID) | ORAL | Status: DC | PRN
  Administered 2020-11-19 – 2020-11-22 (×2): 650 mg via ORAL
  Filled 2020-11-19 (×2): qty 2

## 2020-11-19 NOTE — Progress Notes (Signed)
Complained of spasm on the right hand. Massage done on the affected site but not effective. MD made aware with order. Went to sleep after few min.

## 2020-11-19 NOTE — Progress Notes (Signed)
PROGRESS NOTE    Sheena Morrow  SFK:[id number] DOB: 04/03/1933 DOA: 11/17/2020 PCP: Claretta Fraise, MD   Brief Narrative: 87 year old with past medical history significant for CAD, CHF preserved ejection fraction, chronic A. fib, remote CVA, chronic bilateral lower extremity venous insufficiency and venous stasis dermatitis, colonic AVM with chronic GI bleed and anemia who declined endoscopy and colonoscopy at outside hospital during her admission during September, decision was to stop anticoagulation.  Patient was sent to the ED by PCP due to anasarca and oliguria.  Patient presented complaining of shortness of breath, orthopnea and lower extremity edema.  Patient stopped taking Lasix since her last hospitalization. Patient admitted with CHF exacerbation.  Patient was also found to have 1 blood culture positive for strep.   Assessment & Plan:   Principal Problem:   CHF exacerbation ([place]) Active Problems:   Acute urinary retention   Acute-on-chronic kidney injury ([place])   Atrial fibrillation, chronic (HCC)   Hypothermia   Protein-calorie malnutrition, severe   Pressure injury of skin  1-Acute on Chronic Diastolic exacerbation: Presented with dyspnea, positive JVD, bilateral crackers bilateral lower extremity edema.  Elevated BNP 687, chest x-ray pulmonary edema Last echo 6/20 with Care Everywhere show ejection fraction 60 to 65%. Creatinine increased to 2.  I have changed Lasix to 60 mg twice daily Urine out put yesterday 1.1 L  2--1 blood culture positive for strep: She has lower extremity redness could be her chronic dermatitis but she could have superimposed cellulitis.  I have started IV ceftriaxone 2 g. Infectious disease consultation ECHO ordered.   3-AKI on CKD stage IIIb: Likely related to heart failure exacerbation. Presented with creatinine of 2.0. Creatinine baseline: 1.5  Acute urinary retention: Had in and out. Continue with bladder scan  Chronic A.  fib: Bradycardia: Continue to hold Cardizem Not on anticoagulation due to chronic blood loss  Hypothermia: could be related to  Related to hypothyroidism vs infection   Elevation of TSH: Free T4 was also elevated goes against  hypothyroidism. Repeat TSH, free T3 free T4 tomorrow  Chronic Blood loss anemia: Admitted twice in July and in September at outside facility and required multiple blood transfusion.  Declined endoscopy colonoscopy. Anticoagulation has been discontinued. Hb at 7 on admission.  She received 2 unit of packed red blood cell in the ED.  Continue to monitor Hb stable.   Hyperkalemia: On IV Lasix monitor Severe protein calorie malnutrition, hypoalbuminemia:  Transaminases: Follow trend Stable.   CAD, troponin elevation: Likely demand ischemia in the setting of heart failure.  Chronic bilateral lower extremity venous insufficiency Doppler negative for DVT  History of CVA chronic pain syndrome On methadone and gabapentin.   Pressure ulcer: Wound care consulted.   Pressure Injury 11/18/20 Sacrum Stage 1 -  Intact skin with non-blanchable redness of a localized area usually over a bony prominence. (Active)  11/18/20 1534  Location: Sacrum  Location Orientation:   Staging: Stage 1 -  Intact skin with non-blanchable redness of a localized area usually over a bony prominence.  Wound Description (Comments):   Present on Admission: Yes     Pressure Injury 11/18/20 Heel Right Stage 2 -  Partial thickness loss of dermis presenting as a shallow open injury with a red, pink wound bed without slough. scabbed (Active)  11/18/20 1752  Location: Heel  Location Orientation: Right  Staging: Stage 2 -  Partial thickness loss of dermis presenting as a shallow open injury with a red, pink wound bed without slough.  Wound  Description (Comments): scabbed  Present on Admission: Yes     Nutrition Problem: Severe Malnutrition Etiology: chronic illness (CHF, CKD stage  IIIb)    Signs/Symptoms: severe fat depletion, severe muscle depletion    Interventions: Ensure Enlive (each supplement provides 350kcal and 20 grams of protein), Magic cup, Liberalize Diet  Estimated body mass index is 25.28 kg/m as calculated from the following:   Height as of 09/29/20: 5\' 1"  (1.549 m).   Weight as of this encounter: 60.7 kg.   DVT prophylaxis: SCD Code Status: DNR Family Communication: Daughter over phone.  Disposition Plan:  Status is: Inpatient  Remains inpatient appropriate because: Chronic diastolic heart failure exacerbation, bacteremia        Consultants:  ID  Procedures:  ECHO  Antimicrobials:    Subjective: She is alert, denies worsening dyspnea. Needs her pain medications.    Objective: Vitals:   11/18/20 1700 11/18/20 1951 11/18/20 2300 11/19/20 0415  BP: (!) 106/59 103/70 (!) 117/38 (!) 108/38  Pulse:  60 71 (!) 50  Resp: 20 18 20 20   Temp: 97.8 F (36.6 C) 98.1 F (36.7 C) 98.7 F (37.1 C) 98 F (36.7 C)  TempSrc: Oral Oral Oral Oral  SpO2: 94% 93% 95% 90%  Weight:    60.7 kg    Intake/Output Summary (Last 24 hours) at 11/19/2020 0719 Last data filed at 11/18/2020 2000 Gross per 24 hour  Intake --  Output 1175 ml  Net -1175 ml   Filed Weights   11/19/20 0415  Weight: 60.7 kg    Examination:  General exam: Frail, thin Respiratory system: BL crackles.  Cardiovascular system: S1 & S2 heard, RRR. Positive JVD Gastrointestinal system: Abdomen is nondistended, soft and nontender. No organomegaly or masses felt. Normal bowel sounds heard. Central nervous system: Alert and oriented.  Extremities: Symmetric 5 x 5 power.   Data Reviewed: I have personally reviewed following labs and imaging studies  CBC: Recent Labs  Lab 11/17/20 1744 11/18/20 0258 11/18/20 0911 11/19/20 0100  WBC 7.4  --  7.7 9.8  NEUTROABS 4.9  --   --   --   HGB 7.2* 8.7* 9.5* 10.0*  HCT 25.3* 29.5* 31.9* 32.1*  MCV 89.1  --  86.9  84.9  PLT 328  --  299 676   Basic Metabolic Panel: Recent Labs  Lab 11/17/20 1744 11/18/20 0258 11/19/20 0100  NA 133* 133* 135  K 5.3* 4.5 3.7  CL 95* 97* 97*  CO2 25 26 28   GLUCOSE 77 77 107*  BUN 39* 39* 43*  CREATININE 2.03* 1.91* 2.20*  CALCIUM 8.7* 8.6* 8.1*   GFR: Estimated Creatinine Clearance: 15.6 mL/min (A) (by C-G formula based on SCr of 2.2 mg/dL (H)). Liver Function Tests: Recent Labs  Lab 11/17/20 1744 11/19/20 0100  AST 187* 144*  ALT 81* 82*  ALKPHOS 95 91  BILITOT 0.7 0.7  PROT 7.6 6.6  ALBUMIN 1.8* <1.5*   No results for input(s): LIPASE, AMYLASE in the last 168 hours. No results for input(s): AMMONIA in the last 168 hours. Coagulation Profile: No results for input(s): INR, PROTIME in the last 168 hours. Cardiac Enzymes: No results for input(s): CKTOTAL, CKMB, CKMBINDEX, TROPONINI in the last 168 hours. BNP (last 3 results) No results for input(s): PROBNP in the last 8760 hours. HbA1C: No results for input(s): HGBA1C in the last 72 hours. CBG: No results for input(s): GLUCAP in the last 168 hours. Lipid Profile: No results for input(s): CHOL, HDL, LDLCALC, TRIG, CHOLHDL,  LDLDIRECT in the last 72 hours. Thyroid Function Tests: Recent Labs    11/18/20 0258 11/18/20 0300  TSH 22.300*  --   FREET4  --  1.19*   Anemia Panel: No results for input(s): VITAMINB12, FOLATE, FERRITIN, TIBC, IRON, RETICCTPCT in the last 72 hours. Sepsis Labs: Recent Labs  Lab 11/18/20 0258  PROCALCITON 0.13    Recent Results (from the past 240 hour(s))  Resp Panel by RT-PCR (Flu A&B, Covid) Nasopharyngeal Swab     Status: None   Collection Time: 11/17/20  5:44 PM   Specimen: Nasopharyngeal Swab; Nasopharyngeal(NP) swabs in vial transport medium  Result Value Ref Range Status   SARS Coronavirus 2 by RT PCR NEGATIVE NEGATIVE Final    Comment: (NOTE) SARS-CoV-2 target nucleic acids are NOT DETECTED.  The SARS-CoV-2 RNA is generally detectable in upper  respiratory specimens during the acute phase of infection. The lowest concentration of SARS-CoV-2 viral copies this assay can detect is 138 copies/mL. A negative result does not preclude SARS-Cov-2 infection and should not be used as the sole basis for treatment or other patient management decisions. A negative result may occur with  improper specimen collection/handling, submission of specimen other than nasopharyngeal swab, presence of viral mutation(s) within the areas targeted by this assay, and inadequate number of viral copies(<138 copies/mL). A negative result must be combined with clinical observations, patient history, and epidemiological information. The expected result is Negative.  Fact Sheet for Patients:  EntrepreneurPulse.com.au  Fact Sheet for Healthcare Providers:  IncredibleEmployment.be  This test is no t yet approved or cleared by the [place] FDA and  has been authorized for detection and/or diagnosis of SARS-CoV-2 by FDA under an Emergency Use Authorization (EUA). This EUA will remain  in effect (meaning this test can be used) for the duration of the COVID-19 declaration under Section 564(b)(1) of the Act, 21 U.S.C.section 360bbb-3(b)(1), unless the authorization is terminated  or revoked sooner.       Influenza A by PCR NEGATIVE NEGATIVE Final   Influenza B by PCR NEGATIVE NEGATIVE Final    Comment: (NOTE) The Xpert Xpress SARS-CoV-2/FLU/RSV plus assay is intended as an aid in the diagnosis of influenza from Nasopharyngeal swab specimens and should not be used as a sole basis for treatment. Nasal washings and aspirates are unacceptable for Xpert Xpress SARS-CoV-2/FLU/RSV testing.  Fact Sheet for Patients: EntrepreneurPulse.com.au  Fact Sheet for Healthcare Providers: IncredibleEmployment.be  This test is not yet approved or cleared by the [place] FDA and has been  authorized for detection and/or diagnosis of SARS-CoV-2 by FDA under an Emergency Use Authorization (EUA). This EUA will remain in effect (meaning this test can be used) for the duration of the COVID-19 declaration under Section 564(b)(1) of the Act, 21 U.S.C. section 360bbb-3(b)(1), unless the authorization is terminated or revoked.  Performed at [facility], [place] [address] [place], [place] [address]   Culture, blood (routine x 2)     Status: None (Preliminary result)   Collection Time: 11/18/20  3:06 AM   Specimen: BLOOD RIGHT HAND  Result Value Ref Range Status   Specimen Description BLOOD RIGHT HAND  Final   Special Requests   Final    BOTTLES DRAWN AEROBIC AND ANAEROBIC Blood Culture results may not be optimal due to an inadequate volume of blood received in culture bottles   Culture  Setup Time   Final    GRAM POSITIVE COCCI AEROBIC BOTTLE ONLY CRITICAL RESULT CALLED TO, READ BACK BY AND VERIFIED  WITH: PHARMD JAMES LEDFORD 11/19/20@4 :11 BY TW Performed at [facility], [place] [address], [place], [place] [address]    Culture PENDING  Incomplete   Report Status PENDING  Incomplete  Blood Culture ID Panel (Reflexed)     Status: Abnormal   Collection Time: 11/18/20  3:06 AM  Result Value Ref Range Status   Enterococcus faecalis NOT DETECTED NOT DETECTED Final   Enterococcus Faecium NOT DETECTED NOT DETECTED Final   Listeria monocytogenes NOT DETECTED NOT DETECTED Final   Staphylococcus species NOT DETECTED NOT DETECTED Final   Staphylococcus aureus (BCID) NOT DETECTED NOT DETECTED Final   Staphylococcus epidermidis NOT DETECTED NOT DETECTED Final   Staphylococcus lugdunensis NOT DETECTED NOT DETECTED Final   Streptococcus species DETECTED (A) NOT DETECTED Final    Comment: Not Enterococcus species, Streptococcus agalactiae, Streptococcus pyogenes, or Streptococcus pneumoniae. CRITICAL RESULT CALLED TO, READ BACK BY AND VERIFIED WITH: PHARMD JAMES LEDFORD  11/19/20@4 :11 BY TW    Streptococcus agalactiae NOT DETECTED NOT DETECTED Final   Streptococcus pneumoniae NOT DETECTED NOT DETECTED Final   Streptococcus pyogenes NOT DETECTED NOT DETECTED Final   A.calcoaceticus-baumannii NOT DETECTED NOT DETECTED Final   Bacteroides fragilis NOT DETECTED NOT DETECTED Final   Enterobacterales NOT DETECTED NOT DETECTED Final   Enterobacter cloacae complex NOT DETECTED NOT DETECTED Final   Escherichia coli NOT DETECTED NOT DETECTED Final   Klebsiella aerogenes NOT DETECTED NOT DETECTED Final   Klebsiella oxytoca NOT DETECTED NOT DETECTED Final   Klebsiella pneumoniae NOT DETECTED NOT DETECTED Final   Proteus species NOT DETECTED NOT DETECTED Final   Salmonella species NOT DETECTED NOT DETECTED Final   Serratia marcescens NOT DETECTED NOT DETECTED Final   Haemophilus influenzae NOT DETECTED NOT DETECTED Final   Neisseria meningitidis NOT DETECTED NOT DETECTED Final   Pseudomonas aeruginosa NOT DETECTED NOT DETECTED Final   Stenotrophomonas maltophilia NOT DETECTED NOT DETECTED Final   Candida albicans NOT DETECTED NOT DETECTED Final   Candida auris NOT DETECTED NOT DETECTED Final   Candida glabrata NOT DETECTED NOT DETECTED Final   Candida krusei NOT DETECTED NOT DETECTED Final   Candida parapsilosis NOT DETECTED NOT DETECTED Final   Candida tropicalis NOT DETECTED NOT DETECTED Final   Cryptococcus neoformans/gattii NOT DETECTED NOT DETECTED Final    Comment: Performed at [facility], [address], [place], [place] [address]         Radiology Studies: DG Chest Port 1 View  Result Date: 11/17/2020 CLINICAL DATA:  Short of breath EXAM: PORTABLE CHEST 1 VIEW COMPARISON:  04/13/2016 FINDINGS: Single frontal view of the chest demonstrates an enlarged cardiac silhouette. There is increased vascular congestion with bilateral interstitial and ground-glass opacities most consistent with edema. No large effusion or pneumothorax. No acute bony  abnormality. IMPRESSION: 1. Findings most consistent with congestive heart failure. Electronically Signed   By: Randa Ngo M.D.   On: 11/17/2020 19:14   VAS [place] LOWER EXTREMITY VENOUS (DVT)  Result Date: 11/18/2020  Lower Venous DVT Study Patient Name:  [facility]  Date of Exam:   11/18/2020 Medical Rec #: [id number]      Accession #:    [id number] Date of Birth: 1933-09-06       Patient Gender: F Patient Age:   50 years Exam Location:  [facility] Procedure:      VAS [place] LOWER EXTREMITY VENOUS (DVT) Referring Phys: Wandra Feinstein RATHORE --------------------------------------------------------------------------------  Indications: Swelling.  Risk Factors: None identified. Limitations: Poor ultrasound/tissue interface and patient positioning,  patient immobility, patient pain tolerance. Comparison Study: No prior studies. Performing Technologist: Oliver Hum RVT  Examination Guidelines: A complete evaluation includes B-mode imaging, spectral Doppler, color Doppler, and power Doppler as needed of all accessible portions of each vessel. Bilateral testing is considered an integral part of a complete examination. Limited examinations for reoccurring indications may be performed as noted. The reflux portion of the exam is performed with the patient in reverse Trendelenburg.  +---------+---------------+---------+-----------+----------+-------------------+ RIGHT    CompressibilityPhasicitySpontaneityPropertiesThrombus Aging      +---------+---------------+---------+-----------+----------+-------------------+ CFV      Full           Yes      Yes                                      +---------+---------------+---------+-----------+----------+-------------------+ SFJ      Full                                                             +---------+---------------+---------+-----------+----------+-------------------+ FV Prox  Full                                                              +---------+---------------+---------+-----------+----------+-------------------+ FV Mid                  Yes      Yes                                      +---------+---------------+---------+-----------+----------+-------------------+ FV Distal               Yes      Yes                                      +---------+---------------+---------+-----------+----------+-------------------+ PFV      Full                                                             +---------+---------------+---------+-----------+----------+-------------------+ POP      Full           Yes      Yes                                      +---------+---------------+---------+-----------+----------+-------------------+ PTV      Full                                                             +---------+---------------+---------+-----------+----------+-------------------+  PERO                                                  Not well visualized +---------+---------------+---------+-----------+----------+-------------------+   +---------+---------------+---------+-----------+----------+-------------------+ LEFT     CompressibilityPhasicitySpontaneityPropertiesThrombus Aging      +---------+---------------+---------+-----------+----------+-------------------+ CFV      Full           Yes      Yes                                      +---------+---------------+---------+-----------+----------+-------------------+ SFJ      Full                                                             +---------+---------------+---------+-----------+----------+-------------------+ FV Prox  Full                                                             +---------+---------------+---------+-----------+----------+-------------------+ FV Mid   Full                                                             +---------+---------------+---------+-----------+----------+-------------------+  FV Distal               Yes      Yes                                      +---------+---------------+---------+-----------+----------+-------------------+ PFV      Full                                                             +---------+---------------+---------+-----------+----------+-------------------+ POP      Full           Yes      Yes                                      +---------+---------------+---------+-----------+----------+-------------------+ PTV      Full                                                             +---------+---------------+---------+-----------+----------+-------------------+ PERO  Not well visualized +---------+---------------+---------+-----------+----------+-------------------+     Summary: RIGHT: - There is no evidence of deep vein thrombosis in the lower extremity. However, portions of this examination were limited- see technologist comments above.  - No cystic structure found in the popliteal fossa.  LEFT: - There is no evidence of deep vein thrombosis in the lower extremity. However, portions of this examination were limited- see technologist comments above.  - No cystic structure found in the popliteal fossa.  *See table(s) above for measurements and observations. Electronically signed by Deitra Mayo MD on 11/18/2020 at 3:11:25 PM.    Final         Scheduled Meds:  atorvastatin  20 mg Oral Daily   Chlorhexidine Gluconate Cloth  6 each Topical Q0600   feeding supplement  237 mL Oral BID BM   ferrous sulfate  325 mg Oral Daily   furosemide  80 mg Intravenous BID   gabapentin  1,600 mg Oral QHS   gabapentin  800 mg Oral BID WC   linaclotide  290 mcg Oral QAC breakfast   methadone  10 mg Oral TID   montelukast  10 mg Oral QHS   pantoprazole  40 mg Oral Daily   Continuous Infusions:   LOS: 2 days    Time spent: 35 minutes.     Elmarie Shiley, MD [facility]   If 7PM-7AM, please contact night-coverage [email or web address]  11/19/2020, 7:19 AM

## 2020-11-19 NOTE — Progress Notes (Addendum)
Attempted to reposition her for breakfast, became rude  saying that RN is killing her.explained to her the purpose of making her comfortable while eating.

## 2020-11-19 NOTE — Progress Notes (Signed)
PHARMACY - PHYSICIAN COMMUNICATION CRITICAL VALUE ALERT - BLOOD CULTURE IDENTIFICATION (BCID)  Sheena Morrow is an 87 y.o. female who presented to [facility] on 11/17/2020 with a chief complaint of CHF exacerbation  Assessment: Afebrile, WBC WNL  Name of physician (or Provider) Contacted: Dr Myna Hidalgo  Current antibiotics: None  Changes to prescribed antibiotics recommended:  Cont to monitor off anti-biotics  Results for orders placed or performed during the hospital encounter of 11/17/20  Blood Culture ID Panel (Reflexed) (Collected: 11/18/2020  3:06 AM)  Result Value Ref Range   Enterococcus faecalis NOT DETECTED NOT DETECTED   Enterococcus Faecium NOT DETECTED NOT DETECTED   Listeria monocytogenes NOT DETECTED NOT DETECTED   Staphylococcus species NOT DETECTED NOT DETECTED   Staphylococcus aureus (BCID) NOT DETECTED NOT DETECTED   Staphylococcus epidermidis NOT DETECTED NOT DETECTED   Staphylococcus lugdunensis NOT DETECTED NOT DETECTED   Streptococcus species DETECTED (A) NOT DETECTED   Streptococcus agalactiae NOT DETECTED NOT DETECTED   Streptococcus pneumoniae NOT DETECTED NOT DETECTED   Streptococcus pyogenes NOT DETECTED NOT DETECTED   A.calcoaceticus-baumannii NOT DETECTED NOT DETECTED   Bacteroides fragilis NOT DETECTED NOT DETECTED   Enterobacterales NOT DETECTED NOT DETECTED   Enterobacter cloacae complex NOT DETECTED NOT DETECTED   Escherichia coli NOT DETECTED NOT DETECTED   Klebsiella aerogenes NOT DETECTED NOT DETECTED   Klebsiella oxytoca NOT DETECTED NOT DETECTED   Klebsiella pneumoniae NOT DETECTED NOT DETECTED   Proteus species NOT DETECTED NOT DETECTED   Salmonella species NOT DETECTED NOT DETECTED   Serratia marcescens NOT DETECTED NOT DETECTED   Haemophilus influenzae NOT DETECTED NOT DETECTED   Neisseria meningitidis NOT DETECTED NOT DETECTED   Pseudomonas aeruginosa NOT DETECTED NOT DETECTED   Stenotrophomonas maltophilia NOT DETECTED NOT DETECTED    Candida albicans NOT DETECTED NOT DETECTED   Candida auris NOT DETECTED NOT DETECTED   Candida glabrata NOT DETECTED NOT DETECTED   Candida krusei NOT DETECTED NOT DETECTED   Candida parapsilosis NOT DETECTED NOT DETECTED   Candida tropicalis NOT DETECTED NOT DETECTED   Cryptococcus neoformans/gattii NOT DETECTED NOT DETECTED    Narda Bonds 11/19/2020  4:42 AM

## 2020-11-19 NOTE — Consult Note (Signed)
[facility]    Date of Admission:  11/17/2020     Reason for Consult: Bacteremia     Referring Physician: Dr Tyrell Antonio  Current antibiotics: Ceftriaxone 10/22-present  Previous antibiotics: None   ASSESSMENT:    87 y.o. female admitted with:  Acute CHF exacerbation Positive strep spp blood culture Recent history of septic shock due to C diff s/p FMT Chronic venous insufficiency and stasis dermatitis AKI  She is admitted with heart failure exacerbation and had blood cx drawn in the ED with 1 of 4 bottles positive for a strep spp but otherwise does not appear to have significant ongoing signs/symptoms of infection.  Her bilateral lower extremities have some warmth and erythema in the setting of chronic lymphedema due to venous stasis but no overt cellulitis and it would be unusual to have bilateral cellulitis.  Certainly, her vascular congestion increases the risk of cellulitis but I think the risk of further antibiotics outweighs the benefits of treating a possible superimposed cellulitis at this time.  I think her blood cultures are a contaminant in this setting.  RECOMMENDATIONS:    No further antibiotics Elevate legs to help with lower extremity swelling and consider compression therapy Rest of care per [place] Will continue to follow along to ensure no worsening of her lower extremity erythema   Principal Problem:   CHF exacerbation ([place]) Active Problems:   Acute urinary retention   Acute-on-chronic kidney injury ([place])   Atrial fibrillation, chronic (HCC)   Hypothermia   Protein-calorie malnutrition, severe   Pressure injury of skin   MEDICATIONS:    Scheduled Meds: . atorvastatin  20 mg Oral Daily  . Chlorhexidine Gluconate Cloth  6 each Topical Q0600  . feeding supplement  237 mL Oral BID BM  . ferrous sulfate  325 mg Oral Daily  . furosemide  60 mg Intravenous BID  . gabapentin  1,600 mg Oral QHS  . gabapentin  800 mg Oral BID WC   . linaclotide  290 mcg Oral QAC breakfast  . methadone  10 mg Oral TID  . montelukast  10 mg Oral QHS  . pantoprazole  40 mg Oral Daily   Continuous Infusions: . cefTRIAXone (ROCEPHIN)  IV 2 g (11/19/20 1014)   PRN Meds:.  HPI:    Sheena Morrow is a 87 y.o. female with past medical history of CAD, chronic diastolic heart failure, atrial fibrillation, prior CVA, CKD, chronic bilateral lower extremity venous insufficiency and lymphedema (left greater than right), chronic venous stasis dermatitis, and history of septic shock secondary to C. difficile colitis status post F MT in July 2022 who was admitted 11/17/2020 with dyspnea, increased lower extremity swelling, and sent to the emergency department due to concern for heart failure exacerbation.  In the emergency department she was found to have a rectal temperature of 95.6, WBC 7.4, hemoglobin 7.2, platelets 328.  She had a slight increase in her baseline creatinine.  Chest x-ray showed pulmonary edema and BNP was elevated at 687.  She had blood cultures that were obtained through the emergency department with 1 out of 4 bottles being positive for a streptococcal species.  She remains afebrile and her WBC is normal.  There was concern that she may have bilateral cellulitis as a potential source and ceftriaxone has been ordered.  We were asked to see for further antibiotic recommendations.   Past Medical History:  Diagnosis Date  . Clotting disorder ([place])   . Constipation due to opioid  therapy   . Old MI (myocardial infarction) 07/21/2015  . Osteoporosis   . Postherpetic neuralgia   . Systolic CHF with reduced left ventricular function, NYHA class 2 ([place]) 05/21/2015    Social History   Tobacco Use  . Smoking status: Former    Packs/day: 0.50    Types: Cigarettes    Start date: 04/01/1969    Quit date: 04/01/2008    Years since quitting: 12.6  . Smokeless tobacco: Never  Vaping Use  . Vaping Use: Never used  Substance Use Topics  .  Alcohol use: No    Alcohol/week: 0.0 standard drinks  . Drug use: No    History reviewed. No pertinent family history.  Allergies  Allergen Reactions  . Nsaids Other (See Comments)    GI Bleed  . Dexilant [Dexlansoprazole] Diarrhea  . Pantoprazole   . Raloxifene     Other reaction(s): Cramps (ALLERGY/intolerance)    Review of Systems  Constitutional:  Negative for chills and fever.  Respiratory:  Positive for shortness of breath.   Cardiovascular:  Positive for orthopnea and leg swelling.  All other systems reviewed and are negative.  OBJECTIVE:   Blood pressure (!) 118/31, pulse (!) 58, temperature 98 F (36.7 C), temperature source Oral, resp. rate (!) 25, weight 60.7 kg, SpO2 94 %. Body mass index is 25.28 kg/m.  Physical Exam Constitutional:      Comments: Frail, thin, elderly woman, sitting in bed asking for her Kleenex.  HENT:     Head: Normocephalic and atraumatic.  Eyes:     Extraocular Movements: Extraocular movements intact.     Conjunctiva/sclera: Conjunctivae normal.  Cardiovascular:     Rate and Rhythm: Normal rate and regular rhythm.  Pulmonary:     Comments: Mildly increased WOB, bilateral crackles in the bases.  Abdominal:     General: There is no distension.     Palpations: Abdomen is soft.     Tenderness: There is no abdominal tenderness.  Musculoskeletal:     Cervical back: Normal range of motion and neck supple.     Right lower leg: Edema present.     Left lower leg: Edema present.     Comments: She has chronic venous stasis and lymphedema, left greater than right.  Legs are slightly warm to the touch.   Skin:    General: Skin is warm and dry.     Findings: Erythema present.  Neurological:     General: No focal deficit present.     Mental Status: Mental status is at baseline.  Psychiatric:        Mood and Affect: Mood normal.        Behavior: Behavior normal.     Lab Results: Lab Results  Component Value Date   WBC 9.8 11/19/2020    HGB 10.0 (L) 11/19/2020   HCT 32.1 (L) 11/19/2020   MCV 84.9 11/19/2020   PLT 335 11/19/2020    Lab Results  Component Value Date   NA 135 11/19/2020   K 3.7 11/19/2020   CO2 28 11/19/2020   GLUCOSE 107 (H) 11/19/2020   BUN 43 (H) 11/19/2020   CREATININE 2.20 (H) 11/19/2020   CALCIUM 8.1 (L) 11/19/2020   GFRNONAA 21 (L) 11/19/2020   GFRAA 47 (L) 11/05/2019    Lab Results  Component Value Date   ALT 82 (H) 11/19/2020   AST 144 (H) 11/19/2020   ALKPHOS 91 11/19/2020   BILITOT 0.7 11/19/2020    No results found for: CRP  No results found for: ESRSEDRATE  I have reviewed the micro and lab results in Epic.  Imaging: DG Chest Port 1 View  Result Date: 11/17/2020 CLINICAL DATA:  Short of breath EXAM: PORTABLE CHEST 1 VIEW COMPARISON:  04/13/2016 FINDINGS: Single frontal view of the chest demonstrates an enlarged cardiac silhouette. There is increased vascular congestion with bilateral interstitial and ground-glass opacities most consistent with edema. No large effusion or pneumothorax. No acute bony abnormality. IMPRESSION: 1. Findings most consistent with congestive heart failure. Electronically Signed   By: Randa Ngo M.D.   On: 11/17/2020 19:14   VAS [place] LOWER EXTREMITY VENOUS (DVT)  Result Date: 11/18/2020  Lower Venous DVT Study Patient Name:  Sheena Morrow  Date of Exam:   11/18/2020 Medical Rec #: [id number]      Accession #:    [id number] Date of Birth: 1934-01-25       Patient Gender: F Patient Age:   33 years Exam Location[facility]  [facility] Procedure:      VAS [place] LOWER EXTREMITY VENOUS (DVT) Referring Phys: Wandra Feinstein RATHORE --------------------------------------------------------------------------------  Indications: Swelling.  Risk Factors: None identified. Limitations: Poor ultrasound/tissue interface and patient positioning, patient immobility, patient pain tolerance. Comparison Study: No prior studies. Performing Technologist: Oliver Hum RVT  Examination  Guidelines: A complete evaluation includes B-mode imaging, spectral Doppler, color Doppler, and power Doppler as needed of all accessible portions of each vessel. Bilateral testing is considered an integral part of a complete examination. Limited examinations for reoccurring indications may be performed as noted. The reflux portion of the exam is performed with the patient in reverse Trendelenburg.  +---------+---------------+---------+-----------+----------+-------------------+ RIGHT    CompressibilityPhasicitySpontaneityPropertiesThrombus Aging      +---------+---------------+---------+-----------+----------+-------------------+ CFV      Full           Yes      Yes                                      +---------+---------------+---------+-----------+----------+-------------------+ SFJ      Full                                                             +---------+---------------+---------+-----------+----------+-------------------+ FV Prox  Full                                                             +---------+---------------+---------+-----------+----------+-------------------+ FV Mid                  Yes      Yes                                      +---------+---------------+---------+-----------+----------+-------------------+ FV Distal               Yes      Yes                                      +---------+---------------+---------+-----------+----------+-------------------+  PFV      Full                                                             +---------+---------------+---------+-----------+----------+-------------------+ POP      Full           Yes      Yes                                      +---------+---------------+---------+-----------+----------+-------------------+ PTV      Full                                                             +---------+---------------+---------+-----------+----------+-------------------+ PERO                                                   Not well visualized +---------+---------------+---------+-----------+----------+-------------------+   +---------+---------------+---------+-----------+----------+-------------------+ LEFT     CompressibilityPhasicitySpontaneityPropertiesThrombus Aging      +---------+---------------+---------+-----------+----------+-------------------+ CFV      Full           Yes      Yes                                      +---------+---------------+---------+-----------+----------+-------------------+ SFJ      Full                                                             +---------+---------------+---------+-----------+----------+-------------------+ FV Prox  Full                                                             +---------+---------------+---------+-----------+----------+-------------------+ FV Mid   Full                                                             +---------+---------------+---------+-----------+----------+-------------------+ FV Distal               Yes      Yes                                      +---------+---------------+---------+-----------+----------+-------------------+ PFV      Full                                                             +---------+---------------+---------+-----------+----------+-------------------+  POP      Full           Yes      Yes                                      +---------+---------------+---------+-----------+----------+-------------------+ PTV      Full                                                             +---------+---------------+---------+-----------+----------+-------------------+ PERO                                                  Not well visualized +---------+---------------+---------+-----------+----------+-------------------+     Summary: RIGHT: - There is no evidence of deep vein thrombosis in the lower extremity.  However, portions of this examination were limited- see technologist comments above.  - No cystic structure found in the popliteal fossa.  LEFT: - There is no evidence of deep vein thrombosis in the lower extremity. However, portions of this examination were limited- see technologist comments above.  - No cystic structure found in the popliteal fossa.  *See table(s) above for measurements and observations. Electronically signed by Deitra Mayo MD on 11/18/2020 at 3:11:25 PM.    Final      Imaging independently reviewed in Epic.  [facility] [phone] pager 11/19/2020, 12:08 PM

## 2020-11-19 NOTE — Progress Notes (Signed)
Echo done.

## 2020-11-20 DIAGNOSIS — I5033 Acute on chronic diastolic (congestive) heart failure: Secondary | ICD-10-CM | POA: Diagnosis not present

## 2020-11-20 DIAGNOSIS — N179 Acute kidney failure, unspecified: Secondary | ICD-10-CM | POA: Diagnosis not present

## 2020-11-20 LAB — BASIC METABOLIC PANEL
Anion gap: 9 (ref 5–15)
Anion gap: 11 (ref 5–15)
BUN: 43 mg/dL — ABNORMAL HIGH (ref 8–23)
BUN: 44 mg/dL — ABNORMAL HIGH (ref 8–23)
CO2: 31 mmol/L (ref 22–32)
CO2: 29 mmol/L (ref 22–32)
Calcium: 8 mg/dL — ABNORMAL LOW (ref 8.9–10.3)
Calcium: 7.8 mg/dL — ABNORMAL LOW (ref 8.9–10.3)
Chloride: 95 mmol/L — ABNORMAL LOW (ref 98–111)
Chloride: 93 mmol/L — ABNORMAL LOW (ref 98–111)
Creatinine, Ser: 1.92 mg/dL — ABNORMAL HIGH (ref 0.44–1.00)
Creatinine, Ser: 1.81 mg/dL — ABNORMAL HIGH (ref 0.44–1.00)
GFR, Estimated: 25 mL/min — ABNORMAL LOW (ref >60)
GFR, Estimated: 27 mL/min — ABNORMAL LOW (ref >60)
Glucose, Bld: 104 mg/dL — ABNORMAL HIGH (ref 70–99)
Glucose, Bld: 146 mg/dL — ABNORMAL HIGH (ref 70–99)
Potassium: 3.5 mmol/L (ref 3.5–5.1)
Potassium: 3.6 mmol/L (ref 3.5–5.1)
Sodium: 135 mmol/L (ref 135–145)
Sodium: 133 mmol/L — ABNORMAL LOW (ref 135–145)

## 2020-11-20 LAB — CBC
HCT: 36.7 % (ref 36.0–46.0)
Hemoglobin: 11 g/dL — ABNORMAL LOW (ref 12.0–15.0)
MCH: 26.1 pg (ref 26.0–34.0)
MCHC: 30 g/dL (ref 30.0–36.0)
MCV: 87.2 fL (ref 80.0–100.0)
Platelets: 247 10*3/uL (ref 150–400)
RBC: 4.21 MIL/uL (ref 3.87–5.11)
RDW: 20.1 % — ABNORMAL HIGH (ref 11.5–15.5)
WBC: 11.5 10*3/uL — ABNORMAL HIGH (ref 4.0–10.5)
nRBC: 0 % (ref 0.0–0.2)

## 2020-11-20 LAB — T4, FREE: Free T4: 0.66 ng/dL (ref 0.61–1.12)

## 2020-11-20 LAB — CULTURE, BLOOD (ROUTINE X 2)

## 2020-11-20 LAB — MAGNESIUM: Magnesium: 1.8 mg/dL (ref 1.7–2.4)

## 2020-11-20 LAB — TSH: TSH: 18.896 u[IU]/mL — ABNORMAL HIGH (ref 0.350–4.500)

## 2020-11-20 MED ORDER — IPRATROPIUM-ALBUTEROL 0.5-2.5 (3) MG/3ML IN SOLN: 3.0000 mL | Freq: Four times a day (QID) | RESPIRATORY_TRACT | Status: DC | PRN

## 2020-11-20 MED ORDER — LEVOTHYROXINE SODIUM 25 MCG PO TABS
25.0000 ug | ORAL_TABLET | Freq: Every day | ORAL | Status: DC
  Administered 2020-11-20 – 2020-11-21 (×2): 25 ug via ORAL
  Filled 2020-11-20 (×2): qty 1

## 2020-11-20 MED ORDER — IPRATROPIUM-ALBUTEROL 0.5-2.5 (3) MG/3ML IN SOLN
3.0000 mL | Freq: Four times a day (QID) | RESPIRATORY_TRACT | Status: DC
  Administered 2020-11-20: 3 mL via RESPIRATORY_TRACT
  Filled 2020-11-20 (×2): qty 3

## 2020-11-20 NOTE — Progress Notes (Signed)
PROGRESS NOTE    Sheena Morrow  JHE:[id number] DOB: Sep 02, 1933 DOA: 11/17/2020 PCP: Claretta Fraise, MD   Brief Narrative: 87 year old with past medical history significant for CAD, CHF preserved ejection fraction, chronic A. fib, remote CVA, chronic bilateral lower extremity venous insufficiency and venous stasis dermatitis, colonic AVM with chronic GI bleed and anemia who declined endoscopy and colonoscopy at outside hospital during her admission during September, decision was to stop anticoagulation.  Patient was sent to the ED by PCP due to anasarca and oliguria.  Patient presented complaining of shortness of breath, orthopnea and lower extremity edema.  Patient stopped taking Lasix since her last hospitalization. Patient admitted with CHF exacerbation.  Patient was also found to have 1 blood culture positive for strep.   Assessment & Plan:   Principal Problem:   CHF exacerbation ([place]) Active Problems:   Acute urinary retention   Acute-on-chronic kidney injury ([place])   Atrial fibrillation, chronic ([facility])   Hypothermia   Protein-calorie malnutrition, severe   Pressure injury of skin  1-Acute on Chronic Diastolic exacerbation: Presented with dyspnea, positive JVD, bilateral crackers bilateral lower extremity edema.  Elevated BNP 687, chest x-ray pulmonary edema ECHO 60 to 65%. Diastolic dysfunction.  Continue with  Lasix to 60 mg twice daily   2--1 blood culture positive for strep: Infectious disease consultation, think this is contaminate.  Monitor off antibiotics.   3-AKI on CKD stage IIIb: Likely related to heart failure exacerbation. Presented with creatinine of 2.0. Creatinine baseline: 1.5 Continue to monitor.   Acute urinary retention: Had in and out. Continue with bladder scan  Chronic A. fib: Bradycardia: Continue to hold Cardizem Not on anticoagulation due to chronic blood loss  Hypothermia: could be related to  Related to hypothyroidism vs infection    Elevation of TSH: Repeated TSH 18,000, free T3 free T4 0.6 Will start low dose synthroid.   Chronic Blood loss anemia: Admitted twice in July and in September at outside facility and required multiple blood transfusion.  Declined endoscopy colonoscopy. Anticoagulation has been discontinued. Hb at 7 on admission.  She received 2 unit of packed red blood cell in the ED.  Continue to monitor Hb stable.   Hyperkalemia: On IV Lasix monitor Severe protein calorie malnutrition, hypoalbuminemia: On Ensure.   Transaminases: Follow trend Stable.   CAD, troponin elevation: Likely demand ischemia in the setting of heart failure.  Chronic bilateral lower extremity venous insufficiency Doppler negative for DVT  History of CVA chronic pain syndrome On methadone and gabapentin.   Pressure ulcer: Wound care consulted.   Pressure Injury 11/18/20 Sacrum Stage 1 -  Intact skin with non-blanchable redness of a localized area usually over a bony prominence. (Active)  11/18/20 1534  Location: Sacrum  Location Orientation:   Staging: Stage 1 -  Intact skin with non-blanchable redness of a localized area usually over a bony prominence.  Wound Description (Comments):   Present on Admission: Yes     Pressure Injury 11/18/20 Heel Right Stage 2 -  Partial thickness loss of dermis presenting as a shallow open injury with a red, pink wound bed without slough. scabbed (Active)  11/18/20 1752  Location: Heel  Location Orientation: Right  Staging: Stage 2 -  Partial thickness loss of dermis presenting as a shallow open injury with a red, pink wound bed without slough.  Wound Description (Comments): scabbed  Present on Admission: Yes     Nutrition Problem: Severe Malnutrition Etiology: chronic illness (CHF, CKD stage IIIb)    Signs/Symptoms: severe  fat depletion, severe muscle depletion    Interventions: Ensure Enlive (each supplement provides 350kcal and 20 grams of protein), Magic cup,  Liberalize Diet  Estimated body mass index is 25.28 kg/m as calculated from the following:   Height as of 09/29/20: 5\' 1"  (1.549 m).   Weight as of this encounter: 60.7 kg.   DVT prophylaxis: SCD Code Status: DNR Family Communication: Daughter over phone 10/22. Palliative care consulted for goals of care discussion, patient with multiples commorbidities, live at home alone, would she benefit from palliative care hospice care at home ?  Disposition Plan:  Status is: Inpatient  Remains inpatient appropriate because: Chronic diastolic heart failure exacerbation, bacteremia        Consultants:  ID  Procedures:  ECHO  Antimicrobials:    Subjective: She denies worsening dyspnea, doesn't feel well.   Objective: Vitals:   11/20/20 0153 11/20/20 0200 11/20/20 0900 11/20/20 1200  BP: (!) 98/42  (!) 121/43 (!) 112/44  Pulse: (!) 45  (!) 55 (!) 56  Resp: 13  (!) 21 (!) 23  Temp: 97.9 F (36.6 C) 97.9 F (36.6 C) 98.2 F (36.8 C) 98.1 F (36.7 C)  TempSrc: Oral  Oral Oral  SpO2: 95%  99% 95%  Weight:        Intake/Output Summary (Last 24 hours) at 11/20/2020 1302 Last data filed at 11/19/2020 1900 Gross per 24 hour  Intake --  Output 2100 ml  Net -2100 ml    Filed Weights   11/19/20 0415  Weight: 60.7 kg    Examination:  General exam: frail, thin  Respiratory system: BL crackles.  Cardiovascular system: S 1, S 2 RRR positive JVD Gastrointestinal system: BS present, soft, nt Central nervous system: Alert, oriented.  Extremities: edema, redness   Data Reviewed: I have personally reviewed following labs and imaging studies  CBC: Recent Labs  Lab 11/17/20 1744 11/18/20 0258 11/18/20 0911 11/19/20 0100 11/20/20 1100  WBC 7.4  --  7.7 9.8 11.5*  NEUTROABS 4.9  --   --   --   --   HGB 7.2* 8.7* 9.5* 10.0* 11.0*  HCT 25.3* 29.5* 31.9* 32.1* 36.7  MCV 89.1  --  86.9 84.9 87.2  PLT 328  --  299 335 967    Basic Metabolic Panel: Recent Labs  Lab  11/17/20 1744 11/18/20 0258 11/19/20 0100 11/20/20 1100  NA 133* 133* 135 135  K 5.3* 4.5 3.7 3.5  CL 95* 97* 97* 95*  CO2 25 26 28 31   GLUCOSE 77 77 107* 104*  BUN 39* 39* 43* 43*  CREATININE 2.03* 1.91* 2.20* 1.92*  CALCIUM 8.7* 8.6* 8.1* 8.0*    GFR: Estimated Creatinine Clearance: 17.9 mL/min (A) (by C-G formula based on SCr of 1.92 mg/dL (H)). Liver Function Tests: Recent Labs  Lab 11/17/20 1744 11/19/20 0100  AST 187* 144*  ALT 81* 82*  ALKPHOS 95 91  BILITOT 0.7 0.7  PROT 7.6 6.6  ALBUMIN 1.8* <1.5*    No results for input(s): LIPASE, AMYLASE in the last 168 hours. No results for input(s): AMMONIA in the last 168 hours. Coagulation Profile: No results for input(s): INR, PROTIME in the last 168 hours. Cardiac Enzymes: No results for input(s): CKTOTAL, CKMB, CKMBINDEX, TROPONINI in the last 168 hours. BNP (last 3 results) No results for input(s): PROBNP in the last 8760 hours. HbA1C: No results for input(s): HGBA1C in the last 72 hours. CBG: No results for input(s): GLUCAP in the last 168 hours. Lipid Profile:  No results for input(s): CHOL, HDL, LDLCALC, TRIG, CHOLHDL, LDLDIRECT in the last 72 hours. Thyroid Function Tests: Recent Labs    11/20/20 0102  TSH 18.896*  FREET4 0.66    Anemia Panel: No results for input(s): VITAMINB12, FOLATE, FERRITIN, TIBC, IRON, RETICCTPCT in the last 72 hours. Sepsis Labs: Recent Labs  Lab 11/18/20 0258  PROCALCITON 0.13     Recent Results (from the past 240 hour(s))  Resp Panel by RT-PCR (Flu A&B, Covid) Nasopharyngeal Swab     Status: None   Collection Time: 11/17/20  5:44 PM   Specimen: Nasopharyngeal Swab; Nasopharyngeal(NP) swabs in vial transport medium  Result Value Ref Range Status   SARS Coronavirus 2 by RT PCR NEGATIVE NEGATIVE Final    Comment: (NOTE) SARS-CoV-2 target nucleic acids are NOT DETECTED.  The SARS-CoV-2 RNA is generally detectable in upper respiratory specimens during the acute phase  of infection. The lowest concentration of SARS-CoV-2 viral copies this assay can detect is 138 copies/mL. A negative result does not preclude SARS-Cov-2 infection and should not be used as the sole basis for treatment or other patient management decisions. A negative result may occur with  improper specimen collection/handling, submission of specimen other than nasopharyngeal swab, presence of viral mutation(s) within the areas targeted by this assay, and inadequate number of viral copies(<138 copies/mL). A negative result must be combined with clinical observations, patient history, and epidemiological information. The expected result is Negative.  Fact Sheet for Patients:  EntrepreneurPulse.com.au  Fact Sheet for Healthcare Providers:  IncredibleEmployment.be  This test is no t yet approved or cleared by the [place] FDA and  has been authorized for detection and/or diagnosis of SARS-CoV-2 by FDA under an Emergency Use Authorization (EUA). This EUA will remain  in effect (meaning this test can be used) for the duration of the COVID-19 declaration under Section 564(b)(1) of the Act, 21 U.S.C.section 360bbb-3(b)(1), unless the authorization is terminated  or revoked sooner.       Influenza A by PCR NEGATIVE NEGATIVE Final   Influenza B by PCR NEGATIVE NEGATIVE Final    Comment: (NOTE) The Xpert Xpress SARS-CoV-2/FLU/RSV plus assay is intended as an aid in the diagnosis of influenza from Nasopharyngeal swab specimens and should not be used as a sole basis for treatment. Nasal washings and aspirates are unacceptable for Xpert Xpress SARS-CoV-2/FLU/RSV testing.  Fact Sheet for Patients: EntrepreneurPulse.com.au  Fact Sheet for Healthcare Providers: IncredibleEmployment.be  This test is not yet approved or cleared by the [place] FDA and has been authorized for detection and/or diagnosis of  SARS-CoV-2 by FDA under an Emergency Use Authorization (EUA). This EUA will remain in effect (meaning this test can be used) for the duration of the COVID-19 declaration under Section 564(b)(1) of the Act, 21 U.S.C. section 360bbb-3(b)(1), unless the authorization is terminated or revoked.  Performed at [facility], [place] [address] [place], [place] [address]   Culture, blood (routine x 2)     Status: None (Preliminary result)   Collection Time: 11/18/20  3:00 AM   Specimen: BLOOD LEFT HAND  Result Value Ref Range Status   Specimen Description BLOOD LEFT HAND  Final   Special Requests   Final    BOTTLES DRAWN AEROBIC AND ANAEROBIC Blood Culture results may not be optimal due to an inadequate volume of blood received in culture bottles   Culture   Final    NO GROWTH 2 DAYS Performed at [facility], [place],  [place] [address]    Report Status PENDING  Incomplete  Culture, blood (routine x 2)     Status: Abnormal   Collection Time: 11/18/20  3:06 AM   Specimen: BLOOD RIGHT HAND  Result Value Ref Range Status   Specimen Description BLOOD RIGHT HAND  Final   Special Requests   Final    BOTTLES DRAWN AEROBIC AND ANAEROBIC Blood Culture results may not be optimal due to an inadequate volume of blood received in culture bottles   Culture  Setup Time   Final    GRAM POSITIVE COCCI AEROBIC BOTTLE ONLY CRITICAL RESULT CALLED TO, READ BACK BY AND VERIFIED WITH: PHARMD JAMES LEDFORD 11/19/20@4 :11 BY TW    Culture (A)  Final    VIRIDANS STREPTOCOCCUS THE SIGNIFICANCE OF ISOLATING THIS ORGANISM FROM A SINGLE SET OF BLOOD CULTURES WHEN MULTIPLE SETS ARE DRAWN IS UNCERTAIN. PLEASE NOTIFY THE MICROBIOLOGY DEPARTMENT WITHIN ONE WEEK IF SPECIATION AND SENSITIVITIES ARE REQUIRED. Performed at [facility], [place] [address], [place], [place] [address]    Report Status 11/20/2020 FINAL  Final  Blood Culture ID Panel (Reflexed)     Status: Abnormal   Collection  Time: 11/18/20  3:06 AM  Result Value Ref Range Status   Enterococcus faecalis NOT DETECTED NOT DETECTED Final   Enterococcus Faecium NOT DETECTED NOT DETECTED Final   Listeria monocytogenes NOT DETECTED NOT DETECTED Final   Staphylococcus species NOT DETECTED NOT DETECTED Final   Staphylococcus aureus (BCID) NOT DETECTED NOT DETECTED Final   Staphylococcus epidermidis NOT DETECTED NOT DETECTED Final   Staphylococcus lugdunensis NOT DETECTED NOT DETECTED Final   Streptococcus species DETECTED (A) NOT DETECTED Final    Comment: Not Enterococcus species, Streptococcus agalactiae, Streptococcus pyogenes, or Streptococcus pneumoniae. CRITICAL RESULT CALLED TO, READ BACK BY AND VERIFIED WITH: PHARMD JAMES LEDFORD 11/19/20@4 :11 BY TW    Streptococcus agalactiae NOT DETECTED NOT DETECTED Final   Streptococcus pneumoniae NOT DETECTED NOT DETECTED Final   Streptococcus pyogenes NOT DETECTED NOT DETECTED Final   A.calcoaceticus-baumannii NOT DETECTED NOT DETECTED Final   Bacteroides fragilis NOT DETECTED NOT DETECTED Final   Enterobacterales NOT DETECTED NOT DETECTED Final   Enterobacter cloacae complex NOT DETECTED NOT DETECTED Final   Escherichia coli NOT DETECTED NOT DETECTED Final   Klebsiella aerogenes NOT DETECTED NOT DETECTED Final   Klebsiella oxytoca NOT DETECTED NOT DETECTED Final   Klebsiella pneumoniae NOT DETECTED NOT DETECTED Final   Proteus species NOT DETECTED NOT DETECTED Final   Salmonella species NOT DETECTED NOT DETECTED Final   Serratia marcescens NOT DETECTED NOT DETECTED Final   Haemophilus influenzae NOT DETECTED NOT DETECTED Final   Neisseria meningitidis NOT DETECTED NOT DETECTED Final   Pseudomonas aeruginosa NOT DETECTED NOT DETECTED Final   Stenotrophomonas maltophilia NOT DETECTED NOT DETECTED Final   Candida albicans NOT DETECTED NOT DETECTED Final   Candida auris NOT DETECTED NOT DETECTED Final   Candida glabrata NOT DETECTED NOT DETECTED Final   Candida  krusei NOT DETECTED NOT DETECTED Final   Candida parapsilosis NOT DETECTED NOT DETECTED Final   Candida tropicalis NOT DETECTED NOT DETECTED Final   Cryptococcus neoformans/gattii NOT DETECTED NOT DETECTED Final    Comment: Performed at [facility], [address], [place], [place] [address]          Radiology Studies: ECHOCARDIOGRAM COMPLETE  Result Date: 11/19/2020    ECHOCARDIOGRAM REPORT   Patient Name:   [facility] Date of Exam: 11/19/2020 Medical Rec #:  [id number]  Height:       61.0 in Accession #:    [id number]    Weight:       133.8 lb Date of Birth:  May 08, 1933      BSA:          1.592 m Patient Age:    61 years      BP:           122/43 mmHg Patient Gender: F             HR:           58 bpm. Exam Location:  Inpatient Procedure: 2D Echo, Color Doppler and Cardiac Doppler Indications:    Bacteremia R78.81  History:        Patient has prior history of Echocardiogram examinations, most                 recent 06/21/2016.  Sonographer:    Merrie Roof RDCS Referring Phys: [id number] Halyn Flaugher A Adalyn Pennock IMPRESSIONS  1. Left ventricular ejection fraction, by estimation, is 60 to 65%. The left ventricle has normal function. The left ventricle has no regional wall motion abnormalities. Left ventricular diastolic parameters are consistent with Grade I diastolic dysfunction (impaired relaxation).  2. Right ventricular systolic function is normal. The right ventricular size is normal. There is normal pulmonary artery systolic pressure. The estimated right ventricular systolic pressure is 73.2 mmHg.  3. Left atrial size was severely dilated.  4. The mitral valve is normal in structure. Moderate to severe mitral valve regurgitation. No evidence of mitral stenosis.  5. Tricuspid valve regurgitation is moderate to severe.  6. The aortic valve is normal in structure. Aortic valve regurgitation is not visualized. No aortic stenosis is present.  7. The inferior vena cava is normal in size with greater than  50% respiratory variability, suggesting right atrial pressure of 3 mmHg. Conclusion(s)/Recommendation(s): No evidence of valvular vegetations on this transthoracic echocardiogram. Would recommend a transesophageal echocardiogram to exclude infective endocarditis if clinically indicated. FINDINGS  Left Ventricle: Left ventricular ejection fraction, by estimation, is 60 to 65%. The left ventricle has normal function. The left ventricle has no regional wall motion abnormalities. The left ventricular internal cavity size was normal in size. There is  no left ventricular hypertrophy. Left ventricular diastolic parameters are consistent with Grade I diastolic dysfunction (impaired relaxation). Right Ventricle: The right ventricular size is normal. No increase in right ventricular wall thickness. Right ventricular systolic function is normal. There is normal pulmonary artery systolic pressure. The tricuspid regurgitant velocity is 2.60 m/s, and  with an assumed right atrial pressure of 8 mmHg, the estimated right ventricular systolic pressure is 20.2 mmHg. Left Atrium: Left atrial size was severely dilated. Right Atrium: Right atrial size was normal in size. Pericardium: There is no evidence of pericardial effusion. Mitral Valve: The mitral valve is normal in structure. Moderate to severe mitral valve regurgitation. No evidence of mitral valve stenosis. Tricuspid Valve: The tricuspid valve is normal in structure. Tricuspid valve regurgitation is moderate to severe. No evidence of tricuspid stenosis. Aortic Valve: The aortic valve is normal in structure. Aortic valve regurgitation is not visualized. No aortic stenosis is present. Aortic valve mean gradient measures 7.0 mmHg. Aortic valve peak gradient measures 12.4 mmHg. Pulmonic Valve: The pulmonic valve was normal in structure. Pulmonic valve regurgitation is mild to moderate. No evidence of pulmonic stenosis. Aorta: The aortic root is normal in size and structure.  Venous: The inferior vena cava is normal in size with  greater than 50% respiratory variability, suggesting right atrial pressure of 3 mmHg. IAS/Shunts: No atrial level shunt detected by color flow Doppler.  LEFT VENTRICLE PLAX 2D LVIDd:         5.10 cm LVIDs:         3.30 cm LV PW:         0.90 cm LV IVS:        1.00 cm  RIGHT VENTRICLE             IVC RV Basal diam:  3.70 cm     IVC diam: 2.50 cm RV S prime:     10.90 cm/s TAPSE (M-mode): 1.5 cm LEFT ATRIUM              Index        RIGHT ATRIUM           Index LA diam:        4.50 cm  2.83 cm/m   RA Area:     23.30 cm LA Vol (A2C):   99.9 ml  62.74 ml/m  RA Volume:   66.30 ml  41.64 ml/m LA Vol (A4C):   96.7 ml  60.73 ml/m LA Biplane Vol: 107.0 ml 67.20 ml/m  AORTIC VALVE AV Vmax:           176.00 cm/s AV Vmean:          121.000 cm/s AV VTI:            0.329 m AV Peak Grad:      12.4 mmHg AV Mean Grad:      7.0 mmHg LVOT Vmax:         101.00 cm/s LVOT Vmean:        60.900 cm/s LVOT VTI:          0.188 m LVOT/AV VTI ratio: 0.57  AORTA Ao Root diam: 3.40 cm Ao Asc diam:  3.10 cm MR Peak grad:    112.8 mmHg   TRICUSPID VALVE MR Mean grad:    80.0 mmHg    TR Peak grad:   27.0 mmHg MR Vmax:         531.00 cm/s  TR Vmax:        260.00 cm/s MR Vmean:        430.0 cm/s MR PISA:         1.01 cm     SHUNTS MR PISA Eff ROA: 7 mm        Systemic VTI: 0.19 m MR PISA Radius:  0.40 cm Candee Furbish MD Electronically signed by Candee Furbish MD Signature Date/Time: 11/19/2020/4:42:52 PM    Final    VAS [place] LOWER EXTREMITY VENOUS (DVT)  Result Date: 11/18/2020  Lower Venous DVT Study Patient Name:  [facility]  Date of Exam:   11/18/2020 Medical Rec #: [id number]      Accession #:    [id number] Date of Birth: 26-May-1933       Patient Gender: F Patient Age:   49 years Exam Location:  [facility] Procedure:      VAS [place] LOWER EXTREMITY VENOUS (DVT) Referring Phys: Wandra Feinstein RATHORE --------------------------------------------------------------------------------   Indications: Swelling.  Risk Factors: None identified. Limitations: Poor ultrasound/tissue interface and patient positioning, patient immobility, patient pain tolerance. Comparison Study: No prior studies. Performing Technologist: Oliver Hum RVT  Examination Guidelines: A complete evaluation includes B-mode imaging, spectral Doppler, color Doppler, and power Doppler as needed of all accessible portions of each vessel. Bilateral testing is considered an  integral part of a complete examination. Limited examinations for reoccurring indications may be performed as noted. The reflux portion of the exam is performed with the patient in reverse Trendelenburg.  +---------+---------------+---------+-----------+----------+-------------------+ RIGHT    CompressibilityPhasicitySpontaneityPropertiesThrombus Aging      +---------+---------------+---------+-----------+----------+-------------------+ CFV      Full           Yes      Yes                                      +---------+---------------+---------+-----------+----------+-------------------+ SFJ      Full                                                             +---------+---------------+---------+-----------+----------+-------------------+ FV Prox  Full                                                             +---------+---------------+---------+-----------+----------+-------------------+ FV Mid                  Yes      Yes                                      +---------+---------------+---------+-----------+----------+-------------------+ FV Distal               Yes      Yes                                      +---------+---------------+---------+-----------+----------+-------------------+ PFV      Full                                                             +---------+---------------+---------+-----------+----------+-------------------+ POP      Full           Yes      Yes                                       +---------+---------------+---------+-----------+----------+-------------------+ PTV      Full                                                             +---------+---------------+---------+-----------+----------+-------------------+ PERO  Not well visualized +---------+---------------+---------+-----------+----------+-------------------+   +---------+---------------+---------+-----------+----------+-------------------+ LEFT     CompressibilityPhasicitySpontaneityPropertiesThrombus Aging      +---------+---------------+---------+-----------+----------+-------------------+ CFV      Full           Yes      Yes                                      +---------+---------------+---------+-----------+----------+-------------------+ SFJ      Full                                                             +---------+---------------+---------+-----------+----------+-------------------+ FV Prox  Full                                                             +---------+---------------+---------+-----------+----------+-------------------+ FV Mid   Full                                                             +---------+---------------+---------+-----------+----------+-------------------+ FV Distal               Yes      Yes                                      +---------+---------------+---------+-----------+----------+-------------------+ PFV      Full                                                             +---------+---------------+---------+-----------+----------+-------------------+ POP      Full           Yes      Yes                                      +---------+---------------+---------+-----------+----------+-------------------+ PTV      Full                                                              +---------+---------------+---------+-----------+----------+-------------------+ PERO                                                  Not well visualized +---------+---------------+---------+-----------+----------+-------------------+     Summary:  RIGHT: - There is no evidence of deep vein thrombosis in the lower extremity. However, portions of this examination were limited- see technologist comments above.  - No cystic structure found in the popliteal fossa.  LEFT: - There is no evidence of deep vein thrombosis in the lower extremity. However, portions of this examination were limited- see technologist comments above.  - No cystic structure found in the popliteal fossa.  *See table(s) above for measurements and observations. Electronically signed by Deitra Mayo MD on 11/18/2020 at 3:11:25 PM.    Final         Scheduled Meds:  atorvastatin  20 mg Oral Daily   Chlorhexidine Gluconate Cloth  6 each Topical Q0600   feeding supplement  237 mL Oral BID BM   ferrous sulfate  325 mg Oral Daily   furosemide  60 mg Intravenous BID   gabapentin  1,600 mg Oral QHS   gabapentin  800 mg Oral BID WC   levothyroxine  25 mcg Oral Q0600   linaclotide  290 mcg Oral QAC breakfast   methadone  10 mg Oral TID   montelukast  10 mg Oral QHS   pantoprazole  40 mg Oral Daily   Continuous Infusions:   LOS: 3 days    Time spent: 35 minutes.     Elmarie Shiley, MD [facility]   If 7PM-7AM, please contact night-coverage [email or web address]  11/20/2020, 1:02 PM

## 2020-11-20 NOTE — Consult Note (Signed)
WOC Nurse Consult Note: Reason for Consult:Stage 1 pressure injury to sacrum and Stage 2 pressure injury to right heel. Both of these lesions are within the scope of practice for the Bedside RN.  I have worked with the patient's Bedside RN, Melony Overly to establish the POC for this patient. Wound type: Pressure Injury POA: Yes/No/NA Measurement:Per Melony Overly, Bedside RN  Sacrum:7cm x 7cm area of nonblanching erythema. No open wound, no depth. No drainage. Right heel:4cm x 2cm x 0.1cm with pink, red wound bed, small amount of serous to serosanguinous drainage Wound bed:As noted above Drainage (amount, consistency, odor) As noted above Periwound:intact, dry Dressing procedure/placement/frequency: I will provide the patient with a mattress replacement with low air loss feature, bilateral pressure redistribution heel boots and guidance for the staff to minimize time in the supine position. A silicone foam bordered dressing is to be used on the sacral area and an antimicrobial nonadherent dressing (xeroform) is to be placed over the right heel followed by a dry gauze cover and a silicone foam topper.  [place] nursing team will not follow, but will remain available to this patient, the nursing and medical teams.  Please re-consult if needed. Thanks, Maudie Flakes, MSN, RN, [place], Arther Abbott  Pager# [phone]

## 2020-11-20 NOTE — Progress Notes (Signed)
[facility]  Date of Admission:  11/17/2020           Reason for visit: Follow up on positive blood cultures  Current antibiotics: none  Previous antibiotics: Ceftriaxone x 1 dose 10/22    ASSESSMENT:    87 y.o. female admitted with:  Acute CHF exacerbation Positive blood culture for strep viridans History of septic shock 2/2 C diff s/p FMT Chronic venous insufficiency and stasis dermatitis AKI  RECOMMENDATIONS:    Continue monitoring off antibiotics.  Her legs appear less erythematous today Elevate legs to help with venous insufficiency/stasis dermatitis Monitor fever curve, WBC.  ? If she is at risk for aspiration.  Could consider SLP eval Will continue to follow along    Principal Problem:   CHF exacerbation ([place]) Active Problems:   Acute urinary retention   Acute-on-chronic kidney injury ([place])   Atrial fibrillation, chronic (HCC)   Hypothermia   Protein-calorie malnutrition, severe   Pressure injury of skin    MEDICATIONS:    Scheduled Meds: . atorvastatin  20 mg Oral Daily  . Chlorhexidine Gluconate Cloth  6 each Topical Q0600  . feeding supplement  237 mL Oral BID BM  . ferrous sulfate  325 mg Oral Daily  . furosemide  60 mg Intravenous BID  . gabapentin  1,600 mg Oral QHS  . gabapentin  800 mg Oral BID WC  . levothyroxine  25 mcg Oral Q0600  . linaclotide  290 mcg Oral QAC breakfast  . methadone  10 mg Oral TID  . montelukast  10 mg Oral QHS  . pantoprazole  40 mg Oral Daily   Continuous Infusions: PRN Meds:.acetaminophen  SUBJECTIVE:   24 hour events:  Low grade temp of 100.5 [place] in WBC today Creatinine stable at 1.9  She reports no fevers, chills.  No acute complaints but does not feel great.  No lower extremity pain, no diarrhea, no abdominal pain.  Review of Systems  All other systems reviewed and are negative.    OBJECTIVE:   Blood pressure (!) 112/44, pulse (!) 56, temperature 98.1 F  (36.7 C), temperature source Oral, resp. rate (!) 23, weight 60.7 kg, SpO2 95 %. Body mass index is 25.28 kg/m.  Physical Exam Constitutional:      Comments: Thin, frail appearing woman, sitting crooked in bed.  HENT:     Head: Normocephalic and atraumatic.  Pulmonary:     Effort: Pulmonary effort is normal. No respiratory distress.     Breath sounds: Rales present.  Abdominal:     General: There is no distension.     Palpations: Abdomen is soft.     Tenderness: There is no abdominal tenderness. There is no guarding or rebound.  Musculoskeletal:     Right lower leg: Edema present.     Left lower leg: Edema present.  Skin:    General: Skin is warm and dry.     Findings: Erythema present.     Comments: Improved lower extremity stasis dermatitis.   Neurological:     General: No focal deficit present.     Mental Status: She is oriented to person, place, and time.  Psychiatric:        Mood and Affect: Mood normal.        Behavior: Behavior normal.     Lab Results: Lab Results  Component Value Date   WBC 11.5 (H) 11/20/2020   HGB 11.0 (L) 11/20/2020   HCT 36.7 11/20/2020  MCV 87.2 11/20/2020   PLT 247 11/20/2020    Lab Results  Component Value Date   NA 135 11/20/2020   K 3.5 11/20/2020   CO2 31 11/20/2020   GLUCOSE 104 (H) 11/20/2020   BUN 43 (H) 11/20/2020   CREATININE 1.92 (H) 11/20/2020   CALCIUM 8.0 (L) 11/20/2020   GFRNONAA 25 (L) 11/20/2020   GFRAA 47 (L) 11/05/2019    Lab Results  Component Value Date   ALT 82 (H) 11/19/2020   AST 144 (H) 11/19/2020   ALKPHOS 91 11/19/2020   BILITOT 0.7 11/19/2020    No results found for: CRP  No results found for: ESRSEDRATE   I have reviewed the micro and lab results in Epic.  Imaging: ECHOCARDIOGRAM COMPLETE  Result Date: 11/19/2020    ECHOCARDIOGRAM REPORT   Patient Name:   [facility] Date of Exam: 11/19/2020 Medical Rec #:  [id number]     Height:       61.0 in Accession #:    [id number]    Weight:        133.8 lb Date of Birth:  February 27, 1933      BSA:          1.592 m Patient Age:    55 years      BP:           122/43 mmHg Patient Gender: F             HR:           58 bpm. Exam Location:  Inpatient Procedure: 2D Echo, Color Doppler and Cardiac Doppler Indications:    Bacteremia R78.81  History:        Patient has prior history of Echocardiogram examinations, most                 recent 06/21/2016.  Sonographer:    Merrie Roof RDCS Referring Phys: [id number] BELKYS A REGALADO IMPRESSIONS  1. Left ventricular ejection fraction, by estimation, is 60 to 65%. The left ventricle has normal function. The left ventricle has no regional wall motion abnormalities. Left ventricular diastolic parameters are consistent with Grade I diastolic dysfunction (impaired relaxation).  2. Right ventricular systolic function is normal. The right ventricular size is normal. There is normal pulmonary artery systolic pressure. The estimated right ventricular systolic pressure is 61.4 mmHg.  3. Left atrial size was severely dilated.  4. The mitral valve is normal in structure. Moderate to severe mitral valve regurgitation. No evidence of mitral stenosis.  5. Tricuspid valve regurgitation is moderate to severe.  6. The aortic valve is normal in structure. Aortic valve regurgitation is not visualized. No aortic stenosis is present.  7. The inferior vena cava is normal in size with greater than 50% respiratory variability, suggesting right atrial pressure of 3 mmHg. Conclusion(s)/Recommendation(s): No evidence of valvular vegetations on this transthoracic echocardiogram. Would recommend a transesophageal echocardiogram to exclude infective endocarditis if clinically indicated. FINDINGS  Left Ventricle: Left ventricular ejection fraction, by estimation, is 60 to 65%. The left ventricle has normal function. The left ventricle has no regional wall motion abnormalities. The left ventricular internal cavity size was normal in size. There is  no left  ventricular hypertrophy. Left ventricular diastolic parameters are consistent with Grade I diastolic dysfunction (impaired relaxation). Right Ventricle: The right ventricular size is normal. No increase in right ventricular wall thickness. Right ventricular systolic function is normal. There is normal pulmonary artery systolic pressure. The tricuspid regurgitant velocity is 2.60 m/s, and  with  an assumed right atrial pressure of 8 mmHg, the estimated right ventricular systolic pressure is 76.1 mmHg. Left Atrium: Left atrial size was severely dilated. Right Atrium: Right atrial size was normal in size. Pericardium: There is no evidence of pericardial effusion. Mitral Valve: The mitral valve is normal in structure. Moderate to severe mitral valve regurgitation. No evidence of mitral valve stenosis. Tricuspid Valve: The tricuspid valve is normal in structure. Tricuspid valve regurgitation is moderate to severe. No evidence of tricuspid stenosis. Aortic Valve: The aortic valve is normal in structure. Aortic valve regurgitation is not visualized. No aortic stenosis is present. Aortic valve mean gradient measures 7.0 mmHg. Aortic valve peak gradient measures 12.4 mmHg. Pulmonic Valve: The pulmonic valve was normal in structure. Pulmonic valve regurgitation is mild to moderate. No evidence of pulmonic stenosis. Aorta: The aortic root is normal in size and structure. Venous: The inferior vena cava is normal in size with greater than 50% respiratory variability, suggesting right atrial pressure of 3 mmHg. IAS/Shunts: No atrial level shunt detected by color flow Doppler.  LEFT VENTRICLE PLAX 2D LVIDd:         5.10 cm LVIDs:         3.30 cm LV PW:         0.90 cm LV IVS:        1.00 cm  RIGHT VENTRICLE             IVC RV Basal diam:  3.70 cm     IVC diam: 2.50 cm RV S prime:     10.90 cm/s TAPSE (M-mode): 1.5 cm LEFT ATRIUM              Index        RIGHT ATRIUM           Index LA diam:        4.50 cm  2.83 cm/m   RA Area:      23.30 cm LA Vol (A2C):   99.9 ml  62.74 ml/m  RA Volume:   66.30 ml  41.64 ml/m LA Vol (A4C):   96.7 ml  60.73 ml/m LA Biplane Vol: 107.0 ml 67.20 ml/m  AORTIC VALVE AV Vmax:           176.00 cm/s AV Vmean:          121.000 cm/s AV VTI:            0.329 m AV Peak Grad:      12.4 mmHg AV Mean Grad:      7.0 mmHg LVOT Vmax:         101.00 cm/s LVOT Vmean:        60.900 cm/s LVOT VTI:          0.188 m LVOT/AV VTI ratio: 0.57  AORTA Ao Root diam: 3.40 cm Ao Asc diam:  3.10 cm MR Peak grad:    112.8 mmHg   TRICUSPID VALVE MR Mean grad:    80.0 mmHg    TR Peak grad:   27.0 mmHg MR Vmax:         531.00 cm/s  TR Vmax:        260.00 cm/s MR Vmean:        430.0 cm/s MR PISA:         1.01 cm     SHUNTS MR PISA Eff ROA: 7 mm        Systemic VTI: 0.19 m MR PISA Radius:  0.40 cm Candee Furbish MD Electronically signed  by Candee Furbish MD Signature Date/Time: 11/19/2020/4:42:52 PM    Final    VAS [place] LOWER EXTREMITY VENOUS (DVT)  Result Date: 11/18/2020  Lower Venous DVT Study Patient Name:  [facility]  Date of Exam:   11/18/2020 Medical Rec #: [id number]      Accession #:    [id number] Date of Birth: 09-Dec-1933       Patient Gender: F Patient Age:   54 years Exam [facility] Procedure:      VAS [place] LOWER EXTREMITY VENOUS (DVT) Referring Phys: Wandra Feinstein RATHORE --------------------------------------------------------------------------------  Indications: Swelling.  Risk Factors: None identified. Limitations: Poor ultrasound/tissue interface and patient positioning, patient immobility, patient pain tolerance. Comparison Study: No prior studies. Performing Technologist: Oliver Hum RVT  Examination Guidelines: A complete evaluation includes B-mode imaging, spectral Doppler, color Doppler, and power Doppler as needed of all accessible portions of each vessel. Bilateral testing is considered an integral part of a complete examination. Limited examinations for reoccurring indications may be performed as  noted. The reflux portion of the exam is performed with the patient in reverse Trendelenburg.  +---------+---------------+---------+-----------+----------+-------------------+ RIGHT    CompressibilityPhasicitySpontaneityPropertiesThrombus Aging      +---------+---------------+---------+-----------+----------+-------------------+ CFV      Full           Yes      Yes                                      +---------+---------------+---------+-----------+----------+-------------------+ SFJ      Full                                                             +---------+---------------+---------+-----------+----------+-------------------+ FV Prox  Full                                                             +---------+---------------+---------+-----------+----------+-------------------+ FV Mid                  Yes      Yes                                      +---------+---------------+---------+-----------+----------+-------------------+ FV Distal               Yes      Yes                                      +---------+---------------+---------+-----------+----------+-------------------+ PFV      Full                                                             +---------+---------------+---------+-----------+----------+-------------------+ POP      Full  Yes      Yes                                      +---------+---------------+---------+-----------+----------+-------------------+ PTV      Full                                                             +---------+---------------+---------+-----------+----------+-------------------+ PERO                                                  Not well visualized +---------+---------------+---------+-----------+----------+-------------------+   +---------+---------------+---------+-----------+----------+-------------------+ LEFT     CompressibilityPhasicitySpontaneityPropertiesThrombus  Aging      +---------+---------------+---------+-----------+----------+-------------------+ CFV      Full           Yes      Yes                                      +---------+---------------+---------+-----------+----------+-------------------+ SFJ      Full                                                             +---------+---------------+---------+-----------+----------+-------------------+ FV Prox  Full                                                             +---------+---------------+---------+-----------+----------+-------------------+ FV Mid   Full                                                             +---------+---------------+---------+-----------+----------+-------------------+ FV Distal               Yes      Yes                                      +---------+---------------+---------+-----------+----------+-------------------+ PFV      Full                                                             +---------+---------------+---------+-----------+----------+-------------------+ POP      Full           Yes      Yes                                      +---------+---------------+---------+-----------+----------+-------------------+  PTV      Full                                                             +---------+---------------+---------+-----------+----------+-------------------+ PERO                                                  Not well visualized +---------+---------------+---------+-----------+----------+-------------------+     Summary: RIGHT: - There is no evidence of deep vein thrombosis in the lower extremity. However, portions of this examination were limited- see technologist comments above.  - No cystic structure found in the popliteal fossa.  LEFT: - There is no evidence of deep vein thrombosis in the lower extremity. However, portions of this examination were limited- see technologist comments above.  - No  cystic structure found in the popliteal fossa.  *See table(s) above for measurements and observations. Electronically signed by Deitra Mayo MD on 11/18/2020 at 3:11:25 PM.    Final      Imaging independently reviewed in Epic.    [facility] [phone] pager 11/20/2020, 1:23 PM  I spent greater than 35 minutes with the patient including greater than 50% of time in face to face counsel of the patient and in coordination of their care.

## 2020-11-21 DIAGNOSIS — N189 Chronic kidney disease, unspecified: Secondary | ICD-10-CM

## 2020-11-21 DIAGNOSIS — N179 Acute kidney failure, unspecified: Secondary | ICD-10-CM | POA: Diagnosis not present

## 2020-11-21 DIAGNOSIS — Z515 Encounter for palliative care: Secondary | ICD-10-CM

## 2020-11-21 DIAGNOSIS — R0609 Other forms of dyspnea: Secondary | ICD-10-CM

## 2020-11-21 DIAGNOSIS — Z66 Do not resuscitate: Secondary | ICD-10-CM

## 2020-11-21 DIAGNOSIS — I5033 Acute on chronic diastolic (congestive) heart failure: Secondary | ICD-10-CM | POA: Diagnosis not present

## 2020-11-21 DIAGNOSIS — R7881 Bacteremia: Secondary | ICD-10-CM | POA: Diagnosis not present

## 2020-11-21 MED ORDER — POTASSIUM CHLORIDE CRYS ER 20 MEQ PO TBCR
20.0000 meq | EXTENDED_RELEASE_TABLET | Freq: Once | ORAL | Status: AC
  Administered 2020-11-21: 20 meq via ORAL
  Filled 2020-11-21: qty 1

## 2020-11-21 MED ORDER — IPRATROPIUM-ALBUTEROL 0.5-2.5 (3) MG/3ML IN SOLN
3.0000 mL | Freq: Four times a day (QID) | RESPIRATORY_TRACT | Status: DC
  Administered 2020-11-21: 3 mL via RESPIRATORY_TRACT
  Filled 2020-11-21: qty 3

## 2020-11-21 MED ORDER — MORPHINE SULFATE 10 MG/5ML PO SOLN
5.0000 mg | ORAL | Status: DC | PRN
  Administered 2020-11-22: 5 mg via ORAL
  Filled 2020-11-21: qty 4

## 2020-11-21 MED ORDER — IPRATROPIUM-ALBUTEROL 0.5-2.5 (3) MG/3ML IN SOLN: 3.0000 mL | Freq: Four times a day (QID) | RESPIRATORY_TRACT | Status: DC | PRN

## 2020-11-21 MED ORDER — MORPHINE SULFATE (CONCENTRATE) 10 MG/0.5ML PO SOLN: 5.0000 mg | ORAL | Status: DC | PRN

## 2020-11-21 MED ORDER — LIP MEDEX EX OINT
TOPICAL_OINTMENT | CUTANEOUS | Status: DC | PRN
  Filled 2020-11-21: qty 7

## 2020-11-21 NOTE — Care Management Important Message (Signed)
Important Message  Patient Details  Name: Sheena Morrow MRN: [id number] Date of Birth: [birth year]   Medicare Important Message Given:  Yes     Orbie Pyo 11/21/2020, 2:18 PM

## 2020-11-21 NOTE — Progress Notes (Signed)
Placed on comfort care as ordered by palliative after having a meeting with daughter.

## 2020-11-21 NOTE — Consult Note (Signed)
Consultation Note Date: 11/21/2020   Patient Name: Sheena Morrow  DOB: May 29, 1933  MRN: [id number]  Age / Sex: 87 y.o., female  PCP: Claretta Fraise, MD Referring Physician: Elmarie Shiley, MD  Reason for Consultation: Establishing goals of care and Psychosocial/spiritual support  HPI/Patient Profile: 87 y.o. female   admitted on 11/17/2020 with  past medical history significant for CAD, CHF preserved ejection fraction, chronic A. fib, remote CVA, chronic bilateral lower extremity venous insufficiency and venous stasis dermatitis, colonic AVM with chronic GI bleed and anemia who declined endoscopy and colonoscopy at outside hospital during her admission during September, decision was to stop anticoagulation.    Patient was sent to the ED by PCP due to anasarca and oliguria.  Patient presented complaining of shortness of breath, orthopnea and lower extremity edema.  Patient stopped taking Lasix since her last hospitalization.  Patient admitted with CHF exacerbation.  Patient was also found to have 1 blood culture positive for strep.    Patient and her step- daughter/Dana verbalize continued physical and functional decline over the past many months.  She really did not want to come to the hospital this time but felt pushed, she has expressed desire for a natural death in her home.  Patient and her family face treatment option decisions, advanced directive decisions and anticipatory care needs.  Clinical Assessment and Goals of Care:  This NP Wadie Lessen reviewed medical records, received report from team, assessed the patient and then meet at the patient's bedside  along with her step-daughter Hinton Dyer Weave to discuss diagnosis, prognosis, GOC, EOL wishes disposition and options.   Concept of Palliative Care was introduced as specialized medical care for people and their families living with serious illness.  If  focuses on providing relief from the symptoms and stress of a serious illness.  The goal is to improve quality of life for both the patient and the family.  Created space and opportunity for patient  and family to explore thoughts and feelings regarding current medical situation. Mrs Witts and Hinton Dyer both verbalize an understanding of the seriousness of the  current medical situation.   Patient is not interested in life prolonging measures, rehabilitation, she only wants a comfortable natural death.  She  fiercely values her  independence and dignity.     A  discussion was had today regarding advanced directives.  Concepts specific to code status, artifical feeding and hydration, continued IV antibiotics and rehospitalization was had.    The difference between a aggressive medical intervention path  and a palliative comfort care path for this patient at this time was had.      MOST form completed to reflect full comfort.  Education offered residential hospice benefit and eligibility.   Will shift to comfort here in the hospital and re-evaluate in 24-48 hours for TOC.   Natural trajectory and expectations at EOL were discussed.    Questions and concerns addressed.  Patient  encouraged to call with questions or concerns.     PMT will continue to  support holistically.              NEXT OF KIN/ Step-daughter / Dorathy Daft     SUMMARY OF RECOMMENDATIONS    Code Status/Advance Care Planning: DNR No life prolonging measures, allow a natural death No artifical feeding or hydration  No antibiotic use  Hopeful for residential hospice- evaluate in 24-48 hours as discussed with Dr Tyrell Antonio   Symptom Management:  Dyspnea: Roxanol 5 mg po/sl every 6 hrs scheduled and every 1 hr prn Anxiety:  Ativan 1 mg po/sl every 6 hrs prn  Palliative Prophylaxis:  Bowel Regimen, Frequent Pain Assessment, and Oral Care  Additional Recommendations (Limitations, Scope, Preferences): Full Comfort  Care  Psycho-social/Spiritual:  Desire for further Chaplaincy support:yes Additional Recommendations: Education on Hospice  Prognosis:   Evaluate in 24-48 hours for appropriate transition of care.   Discharge Planning:  To be determined     Primary Diagnoses: Present on Admission: **None**   I have reviewed the medical record, interviewed the patient and family, and examined the patient. The following aspects are pertinent.  Past Medical History:  Diagnosis Date   Clotting disorder ([place])    Constipation due to opioid therapy    Old MI (myocardial infarction) 07/21/2015   Osteoporosis    Postherpetic neuralgia    Systolic CHF with reduced left ventricular function, NYHA class 2 ([place]) 05/21/2015   Social History   Socioeconomic History   Marital status: Divorced    Spouse name: Not on file   Number of children: 1   Years of education: 11   Highest education level: 11th grade  Occupational History   Occupation: Retired  Tobacco Use   Smoking status: Former    Packs/day: 0.50    Types: Cigarettes    Start date: 04/01/1969    Quit date: 04/01/2008    Years since quitting: 12.6   Smokeless tobacco: Never  Vaping Use   Vaping Use: Never used  Substance and Sexual Activity   Alcohol use: No    Alcohol/week: 0.0 standard drinks   Drug use: No   Sexual activity: Not Currently  Other Topics Concern   Not on file  Social History Narrative   Lives alone in apartment - trying to find apartment without stairs   Social Determinants of Health   Financial Resource Strain: Low Risk    Difficulty of Paying Living Expenses: Not hard at all  Food Insecurity: No Food Insecurity   Worried About Charity fundraiser in the Last Year: Never true   [place] in the Last Year: Never true  Transportation Needs: No Transportation Needs   Lack of Transportation (Medical): No   Lack of Transportation (Non-Medical): No  Physical Activity: Inactive   Days of Exercise per Week: 0  days   Minutes of Exercise per Session: 0 min  Stress: No Stress Concern Present   Feeling of Stress : Only a little  Social Connections: Socially Isolated   Frequency of Communication with Friends and Family: More than three times a week   Frequency of Social Gatherings with Friends and Family: Once a week   Attends Religious Services: Never   Marine scientist or Organizations: No   Attends Archivist Meetings: Never   Marital Status: Divorced   History reviewed. No pertinent family history. Scheduled Meds:  atorvastatin  20 mg Oral Daily   Chlorhexidine Gluconate Cloth  6 each Topical Q0600   feeding supplement  237 mL Oral BID  BM   ferrous sulfate  325 mg Oral Daily   furosemide  60 mg Intravenous BID   gabapentin  1,600 mg Oral QHS   gabapentin  800 mg Oral BID WC   ipratropium-albuterol  3 mL Nebulization Q6H   levothyroxine  25 mcg Oral Q0600   linaclotide  290 mcg Oral QAC breakfast   methadone  10 mg Oral TID   montelukast  10 mg Oral QHS   pantoprazole  40 mg Oral Daily   potassium chloride  20 mEq Oral Once   Continuous Infusions: PRN Meds:.acetaminophen Medications Prior to Admission:  Prior to Admission medications   Medication Sig Start Date End Date Taking? Authorizing Provider  atorvastatin (LIPITOR) 20 MG tablet TAKE 1 TABLET BY MOUTH EVERY DAY Patient taking differently: Take 20 mg by mouth daily. 11/04/20  Yes Claretta Fraise, MD  Biotin 10000 MCG TABS Take 10,000 mcg by mouth daily.   Yes [provider]  diltiazem (CARDIZEM CD) 120 MG 24 hr capsule Take 1 capsule (120 mg total) by mouth daily. For blood pressure 10/31/20  Yes Stacks, Cletus Gash, MD  gabapentin (NEURONTIN) 800 MG tablet One in the Morning, one in the afternoon and two at night Patient taking differently: Take 800-1,600 mg by mouth See admin instructions. 800mg  in the morning, 800mg  in the afternoon, and 1600mg  at night 05/23/20  Yes Stacks, Cletus Gash, MD  methadone (DOLOPHINE)  10 MG tablet Take 1 tablet (10 mg total) by mouth 3 (three) times daily. 11/28/20  Yes Stacks, Cletus Gash, MD  montelukast (SINGULAIR) 10 MG tablet TAKE 1 TABLET BY MOUTH EVERYDAY AT BEDTIME Patient taking differently: Take 10 mg by mouth at bedtime. 07/06/20  Yes Claretta Fraise, MD  nitroGLYCERIN (NITROSTAT) 0.4 MG SL tablet Place 0.4 mg under the tongue every 5 (five) minutes as needed for chest pain. 05/17/15 01/07/21 Yes [provider]  pantoprazole (PROTONIX) 40 MG tablet Take 40 mg by mouth daily. 10/07/20  Yes [provider]  RESTASIS 0.05 % ophthalmic emulsion Place 1 drop into both eyes 2 (two) times daily. 02/05/19  Yes [provider]  furosemide (LASIX) 40 MG tablet TAKE 1 TABLET BY MOUTH 2 TIMES DAILY FOR SWELLING Patient not taking: No sig reported 11/17/19   Claretta Fraise, MD  LINZESS 290 MCG CAPS capsule TAKE 1 CAPSULE (290 MCG TOTAL) BY MOUTH DAILY. TO REGULATE BOWEL MOVEMENTS Patient not taking: No sig reported 01/08/20   Claretta Fraise, MD  polyethylene glycol powder (GLYCOLAX/MIRALAX) 17 GM/SCOOP powder One capful in water 2 times daily Patient not taking: No sig reported 09/29/20   Claretta Fraise, MD  XARELTO 10 MG TABS tablet Take 1 tablet (10 mg total) by mouth daily. Patient not taking: No sig reported 05/23/20   Claretta Fraise, MD   Allergies  Allergen Reactions   Nsaids Other (See Comments)    GI Bleed   Dexilant [Dexlansoprazole] Diarrhea   Pantoprazole    Raloxifene     Other reaction(s): Cramps (ALLERGY/intolerance)   Review of Systems  Constitutional:  Positive for appetite change.  Respiratory:  Positive for shortness of breath.   Neurological:  Positive for weakness.   Physical Exam Cardiovascular:     Rate and Rhythm: Normal rate.  Pulmonary:     Effort: Tachypnea present.     Breath sounds: Decreased breath sounds and wheezing present.  Skin:    General: Skin is warm and dry.  Neurological:     Mental Status: She is alert.     Vital  Signs: BP (!) 114/44 (BP Location: Right Arm)   Pulse 61   Temp 98.2 F (36.8 C) (Oral)   Resp 17   Wt 60.7 kg   SpO2 90%   BMI 25.28 kg/m  Pain Scale: 0-10   Pain Score: 0-No pain   SpO2: SpO2: 90 % O2 Device:SpO2: 90 % O2 Flow Rate: .   IO: Intake/output summary:  Intake/Output Summary (Last 24 hours) at 11/21/2020 1239 Last data filed at 11/21/2020 1200 Gross per 24 hour  Intake --  Output 3500 ml  Net -3500 ml    LBM: Last BM Date:  (PTA) Baseline Weight: Weight: 60.7 kg Most recent weight: Weight: 60.7 kg     Palliative Assessment/Data: 30 % at best   Discussed with Dr Frederic Jericho   Time In: 1400 Time Out: 1515 Time Total: 75 minutes   Greater than 50%  of this time was spent counseling and coordinating care related to the above assessment and plan.  Signed by: Wadie Lessen, NP   Please contact Palliative Medicine Team phone at [phone] for questions and concerns.  For individual provider: See Shea Evans

## 2020-11-21 NOTE — Progress Notes (Signed)
Heart Failure Nurse Navigator Progress Note  Following this admission for HV TOC readiness. Pt declined interview at this time as she is eating. Attempted to readjust pt in bed to eat with HOB more elevated, pt c/o pain. Placed HOB back to original position, bedside RN notified. Pt appears frail and is wheezing on room air. Bigeminy noted.   Will attempt to evaluate tomorrow.  Pricilla Holm, MSN, RN Heart Failure Nurse Navigator [phone]

## 2020-11-21 NOTE — Progress Notes (Addendum)
[facility] for Infectious Disease    Date of Admission:  11/17/2020   Total days of antibiotics 0   ID: Sheena Morrow is a 87 y.o. female with chronic afib, admitted for chf exacerbation. Had isolated temp, blood cx showing 1 of 4 bottles with strep viridans group bacteremia. Off of treatment due to suspected contamination Principal Problem:   CHF exacerbation ([place]) Active Problems:   Acute urinary retention   Acute-on-chronic kidney injury ([place])   Atrial fibrillation, chronic ([facility])   Hypothermia   Protein-calorie malnutrition, severe   Pressure injury of skin    Subjective: Afebrile. Slight improvement with shortness of breath, has occ. Cough.   Medications:   atorvastatin  20 mg Oral Daily   Chlorhexidine Gluconate Cloth  6 each Topical Q0600   feeding supplement  237 mL Oral BID BM   ferrous sulfate  325 mg Oral Daily   furosemide  60 mg Intravenous BID   gabapentin  1,600 mg Oral QHS   gabapentin  800 mg Oral BID WC   ipratropium-albuterol  3 mL Nebulization Q6H   levothyroxine  25 mcg Oral Q0600   linaclotide  290 mcg Oral QAC breakfast   methadone  10 mg Oral TID   montelukast  10 mg Oral QHS   pantoprazole  40 mg Oral Daily   potassium chloride  20 mEq Oral Once    Objective: Vital signs in last 24 hours: Temp:  [98.1 F (36.7 C)-98.3 F (36.8 C)] 98.2 F (36.8 C) (10/24 0400) Pulse Rate:  [41-61] 61 (10/24 0738) Resp:  [15-20] 17 (10/24 0738) BP: (108-138)/(38-47) 114/44 (10/24 0738) SpO2:  [90 %-97 %] 90 % (10/24 0738) Physical Exam  Constitutional:  oriented to person, place, and time. appears well-developed and well-nourished. No distress.  HENT: [place]/AT, PERRLA, no scleral icterus Mouth/Throat: Oropharynx is clear and moist. No oropharyngeal exudate.  Cardiovascular: Normal rate, regular rhythm and normal heart sounds. Exam reveals no gallop and no friction rub.  No murmur heard.  Pulmonary/Chest: Effort normal and breath sounds normal. No  respiratory distress.  has no wheezes.  Neck = supple, no nuchal rigidity Abdominal: Soft. Bowel sounds are normal.  exhibits no distension. There is no tenderness.  Lymphadenopathy: no cervical adenopathy. No axillary adenopathy Neurological: alert and oriented to person, place, and time.  Skin: Skin is warm and dry. No rash noted. No erythema.  Psychiatric: a normal mood and affect.  behavior is normal.    Lab Results Recent Labs    11/19/20 0100 11/20/20 1100 11/20/20 2059  WBC 9.8 11.5*  --   HGB 10.0* 11.0*  --   HCT 32.1* 36.7  --   NA 135 135 133*  K 3.7 3.5 3.6  CL 97* 95* 93*  CO2 28 31 29   BUN 43* 43* 44*  CREATININE 2.20* 1.92* 1.81*   Liver Panel Recent Labs    11/19/20 0100  PROT 6.6  ALBUMIN <1.5*  AST 144*  ALT 82*  ALKPHOS 67  BILITOT 0.7    Microbiology: 10/21 blood cx - strep viridans 1 of 4 bottles Studies/Results: ECHOCARDIOGRAM COMPLETE  Result Date: 11/19/2020    ECHOCARDIOGRAM REPORT   Patient Name:   Sheena Morrow Date of Exam: 11/19/2020 Medical Rec #:  [id number]     Height:       61.0 in Accession #:    [id number]    Weight:       133.8 lb Date of Birth:  September 17, 1933  BSA:          1.592 m Patient Age:    15 years      BP:           122/43 mmHg Patient Gender: F             HR:           58 bpm. Exam Location:  Inpatient Procedure: 2D Echo, Color Doppler and Cardiac Doppler Indications:    Bacteremia R78.81  History:        Patient has prior history of Echocardiogram examinations, most                 recent 06/21/2016.  Sonographer:    Merrie Roof RDCS Referring Phys: [id number] BELKYS A REGALADO IMPRESSIONS  1. Left ventricular ejection fraction, by estimation, is 60 to 65%. The left ventricle has normal function. The left ventricle has no regional wall motion abnormalities. Left ventricular diastolic parameters are consistent with Grade I diastolic dysfunction (impaired relaxation).  2. Right ventricular systolic function is normal. The right  ventricular size is normal. There is normal pulmonary artery systolic pressure. The estimated right ventricular systolic pressure is 92.1 mmHg.  3. Left atrial size was severely dilated.  4. The mitral valve is normal in structure. Moderate to severe mitral valve regurgitation. No evidence of mitral stenosis.  5. Tricuspid valve regurgitation is moderate to severe.  6. The aortic valve is normal in structure. Aortic valve regurgitation is not visualized. No aortic stenosis is present.  7. The inferior vena cava is normal in size with greater than 50% respiratory variability, suggesting right atrial pressure of 3 mmHg. Conclusion(s)/Recommendation(s): No evidence of valvular vegetations on this transthoracic echocardiogram. Would recommend a transesophageal echocardiogram to exclude infective endocarditis if clinically indicated. FINDINGS  Left Ventricle: Left ventricular ejection fraction, by estimation, is 60 to 65%. The left ventricle has normal function. The left ventricle has no regional wall motion abnormalities. The left ventricular internal cavity size was normal in size. There is  no left ventricular hypertrophy. Left ventricular diastolic parameters are consistent with Grade I diastolic dysfunction (impaired relaxation). Right Ventricle: The right ventricular size is normal. No increase in right ventricular wall thickness. Right ventricular systolic function is normal. There is normal pulmonary artery systolic pressure. The tricuspid regurgitant velocity is 2.60 m/s, and  with an assumed right atrial pressure of 8 mmHg, the estimated right ventricular systolic pressure is 19.4 mmHg. Left Atrium: Left atrial size was severely dilated. Right Atrium: Right atrial size was normal in size. Pericardium: There is no evidence of pericardial effusion. Mitral Valve: The mitral valve is normal in structure. Moderate to severe mitral valve regurgitation. No evidence of mitral valve stenosis. Tricuspid Valve: The  tricuspid valve is normal in structure. Tricuspid valve regurgitation is moderate to severe. No evidence of tricuspid stenosis. Aortic Valve: The aortic valve is normal in structure. Aortic valve regurgitation is not visualized. No aortic stenosis is present. Aortic valve mean gradient measures 7.0 mmHg. Aortic valve peak gradient measures 12.4 mmHg. Pulmonic Valve: The pulmonic valve was normal in structure. Pulmonic valve regurgitation is mild to moderate. No evidence of pulmonic stenosis. Aorta: The aortic root is normal in size and structure. Venous: The inferior vena cava is normal in size with greater than 50% respiratory variability, suggesting right atrial pressure of 3 mmHg. IAS/Shunts: No atrial level shunt detected by color flow Doppler.  LEFT VENTRICLE PLAX 2D LVIDd:         5.10  cm LVIDs:         3.30 cm LV PW:         0.90 cm LV IVS:        1.00 cm  RIGHT VENTRICLE             IVC RV Basal diam:  3.70 cm     IVC diam: 2.50 cm RV S prime:     10.90 cm/s TAPSE (M-mode): 1.5 cm LEFT ATRIUM              Index        RIGHT ATRIUM           Index LA diam:        4.50 cm  2.83 cm/m   RA Area:     23.30 cm LA Vol (A2C):   99.9 ml  62.74 ml/m  RA Volume:   66.30 ml  41.64 ml/m LA Vol (A4C):   96.7 ml  60.73 ml/m LA Biplane Vol: 107.0 ml 67.20 ml/m  AORTIC VALVE AV Vmax:           176.00 cm/s AV Vmean:          121.000 cm/s AV VTI:            0.329 m AV Peak Grad:      12.4 mmHg AV Mean Grad:      7.0 mmHg LVOT Vmax:         101.00 cm/s LVOT Vmean:        60.900 cm/s LVOT VTI:          0.188 m LVOT/AV VTI ratio: 0.57  AORTA Ao Root diam: 3.40 cm Ao Asc diam:  3.10 cm MR Peak grad:    112.8 mmHg   TRICUSPID VALVE MR Mean grad:    80.0 mmHg    TR Peak grad:   27.0 mmHg MR Vmax:         531.00 cm/s  TR Vmax:        260.00 cm/s MR Vmean:        430.0 cm/s MR PISA:         1.01 cm     SHUNTS MR PISA Eff ROA: 7 mm        Systemic VTI: 0.19 m MR PISA Radius:  0.40 cm Candee Furbish MD Electronically signed by Candee Furbish MD Signature Date/Time: 11/19/2020/4:42:52 PM    Final      Assessment/Plan:  Strep bacteremia = suspect contamination. Continue to monitor off of abtx.   Hx of cdifficile, and hx of FMT = no new onset diarrhea  Will sign off. Call if further questions arise.  [facility] for Infectious Diseases Pager: [phone]  11/21/2020, 12:32 PM

## 2020-11-21 NOTE — Evaluation (Signed)
Physical Therapy Evaluation Patient Details Name: Sheena Morrow MRN: [id number] DOB: 08-22-1933 Today's Date: 11/21/2020  History of Present Illness  The pt is an 87 yo female presenting 10/20 due to SOB and increased fluid and swelling. Upon work up, pt founs to have acute CHF exacerbation and oliguria. PMH includes: MI (2017), CHF, HLD, memory loss, CAD, afib, and closed head injury (06/2020).   Clinical Impression  Pt in bed upon arrival of PT, agreeable to evaluation at this time. Prior to admission the pt was mobilizing short distances in the home with use of RW and increased time. She reports she was able to complete ADLs, but family/meals on wheels brought food and completed all home-care and IADLs for her. The pt now presents with limitations in functional mobility, power, ROM, strength, and endurance due to above dx and chronic debility, and will continue to benefit from skilled PT to address these deficits. The pt was able to complete sit-stand transfers and gait in the room (~15 ft) with use of RW and minA for safety and cues for technique. She is most challenged at this time by bed mobility and requires increased assist, time, and recovery time to complete as this activity resulted in 3/4 DOE for the pt even with assist.   The pt states she does not want SNF rehab following d/c when discussing d/c plan during the session. However, she would require more assist from family or [facility] aides than she had PTA given her current status and deficits. If family is unable to step up to provide assist/supervision, then she may need placement at SNF facility for continued rehab where assist is available.       Recommendations for follow up therapy are one component of a multi-disciplinary discharge planning process, led by the attending physician.  Recommendations may be updated based on patient status, additional functional criteria and insurance authorization.  Follow Up Recommendations Home health  PT    Assistance Recommended at Discharge Frequent or constant Supervision/Assistance  Functional Status Assessment Patient has had a recent decline in their functional status and demonstrates the ability to make significant improvements in function in a reasonable and predictable amount of time.  Equipment Recommendations  Wheelchair (measurements PT);Wheelchair cushion (measurements PT);Hospital bed    Recommendations for Other Services       Precautions / Restrictions Precautions Precautions: Fall;Other (comment) Precaution Comments: skin integrity Restrictions Weight Bearing Restrictions: No Other Position/Activity Restrictions: PRAFO RLE due to pressure sore on heel      Mobility  Bed Mobility Overal bed mobility: Needs Assistance Bed Mobility: Supine to Sit;Sit to Sidelying;Rolling Rolling: Min guard   Supine to sit: Min assist;HOB elevated   Sit to sidelying: Mod assist General bed mobility comments: Min A with assist to advance B LE to EOB, step by step cueing to use bedrail. Mod A to get B LE back into bed. educated in log rolling in attempts to ease back pain    Transfers Overall transfer level: Needs assistance Equipment used: Rolling walker (2 wheels) Transfers: Sit to/from Stand Sit to Stand: Min assist;+2 physical assistance;+2 safety/equipment           General transfer comment: Min A of 2 for safety initially, able to complete with minA and increased time/cues.    Ambulation/Gait Ambulation/Gait assistance: Min assist Gait Distance (Feet): 15 Feet Assistive device: Rolling walker (2 wheels) Gait Pattern/deviations: Step-through pattern;Decreased stride length;Shuffle;Trunk flexed Gait velocity: decreased Gait velocity interpretation: <1.31 ft/sec, indicative of household ambulator General Gait Details:  pt with minimal step clearance and stride length. significant trunk flexion which pt states is her basline      Balance Overall balance  assessment: Needs assistance Sitting-balance support: No upper extremity supported;Feet supported Sitting balance-Leahy Scale: Fair     Standing balance support: Bilateral upper extremity supported;During functional activity;Reliant on assistive device for balance Standing balance-Leahy Scale: Poor Standing balance comment: reliant on UE support in standing                             Pertinent Vitals/Pain Pain Assessment: Faces Faces Pain Scale: Hurts little more Pain Location: back, R hip Pain Descriptors / Indicators: Grimacing;Guarding Pain Intervention(s): Monitored during session;Repositioned    Home Living Family/patient expects to be discharged to:: Private residence Living Arrangements: Alone Available Help at Discharge: Family;Home health;Available PRN/intermittently Type of Home: House Home Access: Level entry       Home Layout: One level Home Equipment: Conservation officer, nature (2 wheels);Grab bars - tub/shower      Prior Function Prior Level of Function : Needs assist       Physical Assist : Mobility (physical);ADLs (physical) Mobility (physical): Gait;Bed mobility;Transfers ADLs (physical): IADLs Mobility Comments: use of RW for mobility short distances, has been working with HHPT. pt states getting out of bed is most difficult for her, spends most of her time in a recliner ADLs Comments: HHOT meal assist (Meals on Wheels for all meals). Can do ADLs (sponge bathes), dressing and bathing tasks per pt     Hand Dominance   Dominant Hand: Right    Extremity/Trunk Assessment   Upper Extremity Assessment Upper Extremity Assessment: Defer to OT evaluation    Lower Extremity Assessment Lower Extremity Assessment: Generalized weakness;RLE deficits/detail;LLE deficits/detail RLE Deficits / Details: grossly 3/5, pt with continued redness distal leg, but reports swelling is reduced. pressure sore R heel RLE Sensation: WNL RLE Coordination: WNL LLE Deficits  / Details: grossly 3/5, pt swelling reduced but redness remains distal leg LLE Sensation: WNL LLE Coordination: WNL    Cervical / Trunk Assessment Cervical / Trunk Assessment: Kyphotic  Communication   Communication: HOH  Cognition Arousal/Alertness: Awake/alert Behavior During Therapy: WFL for tasks assessed/performed Overall Cognitive Status: No family/caregiver present to determine baseline cognitive functioning                                 General Comments: likely at cognitive baseline, follows directions with increased time (HOH), able to recall PLOF, does have decreased understanding of importance of positioning for skin integrity and lung health        General Comments General comments (skin integrity, edema, etc.): HR up to 105, SpO2 WFL on RA. 3/4 DOE noted with bed mobility, 1/4 DOE with ambulation and LB ADLs        Assessment/Plan    PT Assessment Patient needs continued PT services  PT Problem List Decreased strength;Decreased range of motion;Decreased activity tolerance;Decreased balance;Decreased mobility;Decreased safety awareness;Decreased skin integrity       PT Treatment Interventions DME instruction;Gait training;Functional mobility training;Therapeutic activities;Therapeutic exercise;Balance training;Patient/family education    PT Goals (Current goals can be found in the Care Plan section)  Acute Rehab PT Goals Patient Stated Goal: return home (declines SNF) PT Goal Formulation: With patient Time For Goal Achievement: 12/05/20 Potential to Achieve Goals: Fair    Frequency Min 3X/week   Barriers to discharge Decreased caregiver support pt  states she is from home alone, thinks her family can stay with her after d/c home    Co-evaluation PT/OT/SLP Co-Evaluation/Treatment: Yes Reason for Co-Treatment: To address functional/ADL transfers;Other (comment) (pt with lower endurance, frail appearing condition) PT goals addressed during  session: Mobility/safety with mobility;Balance;Proper use of DME;Strengthening/ROM OT goals addressed during session: ADL's and self-care       AM-PAC PT "6 Clicks" Mobility  Outcome Measure Help needed turning from your back to your side while in a flat bed without using bedrails?: A Lot Help needed moving from lying on your back to sitting on the side of a flat bed without using bedrails?: A Lot Help needed moving to and from a bed to a chair (including a wheelchair)?: A Little Help needed standing up from a chair using your arms (e.g., wheelchair or bedside chair)?: A Little Help needed to walk in hospital room?: A Little Help needed climbing 3-5 steps with a railing? : A Lot 6 Click Score: 15    End of Session Equipment Utilized During Treatment: Gait belt Activity Tolerance: Patient tolerated treatment well Patient left: in bed;with call bell/phone within reach (PRAFO RLE) Nurse Communication: Mobility status PT Visit Diagnosis: Unsteadiness on feet (R26.81);Other abnormalities of gait and mobility (R26.89);Muscle weakness (generalized) (M62.81)    Time: 9449-6759 PT Time Calculation (min) (ACUTE ONLY): 31 min   Charges:   PT Evaluation $PT Eval Low Complexity: 1 Low          West Carbo, PT, DPT   Acute Rehabilitation Department Pager #: [phone]  Sandra Cockayne 11/21/2020, 10:21 AM

## 2020-11-21 NOTE — TOC Initial Note (Signed)
Transition of Care [facility]) - Initial/Assessment Note    Patient Details  Name: Sheena Morrow MRN: [id number] Date of Birth: 1933-12-20  Transition of [facility]) CM/SW Contact:    Erenest Rasher, RN Phone Number: [phone] 11/21/2020, 5:28 PM  Clinical Narrative:                 HF TOC CM spoke to pt and states he is agreeable to SNF rehab. Gave permission to fax referral and create FL2. Gave permission to speak to dtr, Hinton Dyer.   Expected Discharge Plan: Skilled Nursing Facility Barriers to Discharge: Continued Medical Work up   Patient Goals and CMS Choice Patient states their goals for this hospitalization and ongoing recovery are:: get stronger before she goes home CMS Medicare.gov Compare Post Acute Care list provided to:: Patient Choice offered to / list presented to : Patient  Expected Discharge Plan and Services Expected Discharge Plan: Skilled Nursing Facility In-house Referral: Clinical Social Work Discharge Planning Services: CM Consult Post Acute Care Choice: [place] Living arrangements for the past 2 months: [place]                                      Prior Living Arrangements/Services Living arrangements for the past 2 months: Single Family Home Lives with:: Self Patient language and need for interpreter reviewed:: Yes Do you feel safe going back to the place where you live?: Yes      Need for Family Participation in Patient Care: Yes (Comment) Care giver support system in place?: Yes (comment) Current home services: DME (rolling walker, bedside commode) Criminal Activity/Legal Involvement Pertinent to Current Situation/Hospitalization: No - Comment as needed  Activities of Daily Living Home Assistive Devices/Equipment: Walker (specify type) ADL Screening (condition at time of admission) Patient's cognitive ability adequate to safely complete daily activities?: Yes Is the patient deaf or have difficulty hearing?:  No Does the patient have difficulty seeing, even when wearing glasses/contacts?: No Does the patient have difficulty concentrating, remembering, or making decisions?: No Patient able to express need for assistance with ADLs?: Yes Does the patient have difficulty dressing or bathing?: Yes Independently performs ADLs?: Yes (appropriate for developmental age) Does the patient have difficulty walking or climbing stairs?: Yes Weakness of Legs: Both Weakness of Arms/Hands: None  Permission Sought/Granted Permission sought to share information with : Case Manager, Family Supports, PCP, Customer service manager Permission granted to share information with : Yes, Verbal Permission Granted  Share Information with NAME: Sharnetta Gielow  Permission granted to share info w AGENCY: SNF, [place] granted to share info w Relationship: daughter  Permission granted to share info w Contact Information: Richarda Osmond  Emotional Assessment       Orientation: : Oriented to Self, Oriented to Place, Oriented to  Time, Oriented to Situation   Psych Involvement: No (comment)  Admission diagnosis:  Hyperkalemia [E87.5] CHF exacerbation (HCC) [I50.9] AKI (acute kidney injury) ([place]) [N17.9] Anemia, unspecified type [D64.9] Congestive heart failure, unspecified HF chronicity, unspecified heart failure type [facility]) [I50.9] Patient Active Problem List   Diagnosis Date Noted   Acute-on-chronic kidney injury ([place]) 11/18/2020   Atrial fibrillation, chronic ([place]) 11/18/2020   Hypothermia 11/18/2020   Protein-calorie malnutrition, severe 11/18/2020   Pressure injury of skin 11/18/2020   CHF exacerbation ([place]) 11/17/2020   Closed head injury 07/07/2020   Asthma 01/13/2020   Constipation 01/13/2020  GERD (gastroesophageal reflux disease) 01/13/2020   Hyperlipidemia 01/13/2020   Onychomycosis 01/13/2020   Acute urinary retention 01/02/2020   Chronic pain syndrome 12/31/2019   Tibial plateau  fracture, left 12/29/2019   Iron deficiency anemia due to chronic blood loss 06/26/2019   Macrocytic anemia 05/14/2019   Memory loss 75/30/0511   Uncomplicated opioid dependence ([place]) 08/11/2018   CKD (chronic kidney disease), stage III ([place]) 02/20/2018   Bullous pemphigoid 06/26/2017   Elevated liver function tests 07/13/2016   Keratoconjunctivitis sicca of both eyes not specified as Sjogren's 11/02/2015   Nuclear sclerotic cataract of both eyes 11/02/2015   Old MI (myocardial infarction) 07/21/2015   CAD in native artery 07/11/2015   History of acute inferior wall MI 07/11/2015   Paroxysmal atrial fibrillation ([place]) 03/11/1733   Systolic CHF with reduced left ventricular function, NYHA class 2 ([place]) 05/21/2015   Renal cyst 04/11/2015   Osteoporosis 01/21/2015   Rosacea 01/04/2015   Postherpetic neuralgia 04/06/2014   PCP:  Claretta Fraise, MD Pharmacy:   [id number] - [place], [place] - [address] [place] [address] Phone: [phone] Fax: [phone]     Social Determinants of Health (SDOH) Interventions    Readmission Risk Interventions No flowsheet data found.

## 2020-11-21 NOTE — Evaluation (Signed)
Occupational Therapy Evaluation Patient Details Name: Sheena Morrow MRN: [id number] DOB: 1933-08-29 Today's Date: 11/21/2020   History of Present Illness The pt is an 87 yo female presenting 10/20 due to SOB and increased fluid and swelling. Upon work up, pt founs to have acute CHF exacerbation and oliguria. PMH includes: MI (2017), CHF, HLD, memory loss, CAD, afib, and closed head injury (06/2020).   Clinical Impression   PTA, pt lives alone with intermittent support from family and [facility] services. Pt reports [place] with ADLs and mobility using RW, assistance for IADLs at home. Pt presents now likely not far from baseline. Pt noted with 3/4 DOE during bed mobility, requiring up to Mod A for this task. However, once up, pt able to mobilize with min guard to Min A using RW via slow pace. Pt requiring Min A for UB ADLs and up to Mod A for LB ADLs due to weakness and chronic back pain. If family able to stay with pt or provide increased supervision for mobility, assist for ADLs initially, pt would be able to DC home safely with [facility] follow-up (including aide assist). If increased support not available, would recommend SNF rehab (pt refusing SNF).   SpO2 WFL on RA, HR up to 105bpm     Recommendations for follow up therapy are one component of a multi-disciplinary discharge planning process, led by the attending physician.  Recommendations may be updated based on patient status, additional functional criteria and insurance authorization.   Follow Up Recommendations  Home health OT    Assistance Recommended at Discharge Intermittent Supervision/Assistance  Functional Status Assessment  Patient has had a recent decline in their functional status and demonstrates the ability to make significant improvements in function in a reasonable and predictable amount of time.  Equipment Recommendations  Wheelchair (measurements OT);Wheelchair cushion (measurements OT);Hospital bed    Recommendations  for Other Services Other (comment) ([place] aide)     Precautions / Restrictions Precautions Precautions: Fall;Other (comment) Precaution Comments: skin integrity Restrictions Weight Bearing Restrictions: No      Mobility Bed Mobility Overal bed mobility: Needs Assistance Bed Mobility: Supine to Sit;Sit to Sidelying;Rolling Rolling: Min guard   Supine to sit: Min assist;HOB elevated   Sit to sidelying: Mod assist General bed mobility comments: Min A with assist to advance B LE to EOB, step by step cueing to use bedrail. Mod A to get B LE back into bed. educated in log rolling in attempts to ease back pain    Transfers Overall transfer level: Needs assistance Equipment used: Rolling walker (2 wheels) Transfers: Sit to/from Stand Sit to Stand: Min assist;+2 physical assistance;+2 safety/equipment           General transfer comment: Min A x 2 for safety, cues for hand placement and pt progressing to 1 person assist      Balance Overall balance assessment: Needs assistance Sitting-balance support: No upper extremity supported;Feet supported Sitting balance-Leahy Scale: Fair     Standing balance support: Bilateral upper extremity supported;During functional activity;Reliant on assistive device for balance Standing balance-Leahy Scale: Poor Standing balance comment: reliant on UE support in standing                           ADL either performed or assessed with clinical judgement   ADL Overall ADL's : Needs assistance/impaired Eating/Feeding: Set up;Sitting Eating/Feeding Details (indicate cue type and reason): to drink from can w/ straw Grooming: Set up;Sitting   Upper Body  Bathing: Minimal assistance;Sitting   Lower Body Bathing: Minimal assistance;Sit to/from stand Lower Body Bathing Details (indicate cue type and reason): assist for posterior region in standing with RW Upper Body Dressing : Set up;Sitting   Lower Body Dressing: Moderate assistance;Sit  to/from stand Lower Body Dressing Details (indicate cue type and reason): assist to get clothing around B feet (sock mgmt due to high sizewise bed in room) Toilet Transfer: Minimal assistance;Ambulation;Regular Toilet;Rolling walker (2 wheels)   Toileting- Clothing Manipulation and Hygiene: Moderate assistance;Sit to/from stand       Functional mobility during ADLs: Minimal assistance;Rolling walker (2 wheels);Cueing for sequencing General ADL Comments: Pt likely close to functional baseline, some cues and assist needed for turning RW, increased assist for LB ADLs due to difficulty reaching feet from sizewise bed and generalized pain. discussed ADL/IADL routine and helpful DME     Vision Baseline Vision/History: 1 Wears glasses Ability to See in Adequate Light: 1 Impaired Patient Visual Report: No change from baseline Vision Assessment?: No apparent visual deficits     Perception     Praxis      Pertinent Vitals/Pain Pain Assessment: Faces Faces Pain Scale: Hurts little more Pain Location: back, R hip Pain Descriptors / Indicators: Grimacing;Guarding Pain Intervention(s): Monitored during session;Limited activity within patient's tolerance     Hand Dominance Right   Extremity/Trunk Assessment Upper Extremity Assessment Upper Extremity Assessment: Generalized weakness (reports getting cramps in hands when cold)   Lower Extremity Assessment Lower Extremity Assessment: Defer to PT evaluation   Cervical / Trunk Assessment Cervical / Trunk Assessment: Kyphotic   Communication Communication Communication: HOH   Cognition Arousal/Alertness: Awake/alert Behavior During Therapy: WFL for tasks assessed/performed Overall Cognitive Status: No family/caregiver present to determine baseline cognitive functioning                                 General Comments: likely at cognitive baseline, follows directions with increased time (HOH), able to recall PLOF, does have  decreased understanding of importance of positioning for skin integrity and lung health     General Comments  HR up to 105, SpO2 WFL on RA. 3/4 DOE noted with bed mobility, 1/4 DOE with ambulation and LB ADLs    Exercises     Shoulder Instructions      Home Living Family/patient expects to be discharged to:: Private residence Living Arrangements: Alone Available Help at Discharge: Family;Home health;Available PRN/intermittently Type of Home: House Home Access: Level entry     Home Layout: One level     Bathroom Shower/Tub: Sponge bathes at baseline   Bathroom Toilet: Standard     Home Equipment: Conservation officer, nature (2 wheels);Grab bars - tub/shower          Prior Functioning/Environment Prior Level of Function : Needs assist       Physical Assist : Mobility (physical);ADLs (physical) Mobility (physical): Gait ADLs (physical): IADLs Mobility Comments: use of RW for mobility short distances, has been working with HHPT ADLs Comments: HHOT meal assist (Meals on Wheels for all meals). Can do ADLs (sponge bathes), dressing and bathing tasks per pt        OT Problem List: Decreased strength;Decreased activity tolerance;Impaired balance (sitting and/or standing);Pain;Cardiopulmonary status limiting activity      OT Treatment/Interventions: Self-care/ADL training;Therapeutic exercise;Energy conservation;DME and/or AE instruction;Therapeutic activities;Patient/family education;Balance training    OT Goals(Current goals can be found in the care plan section) Acute Rehab OT Goals Patient Stated Goal:  go home, refuses SNF OT Goal Formulation: With patient Time For Goal Achievement: 12/05/20 Potential to Achieve Goals: Fair ADL Goals Pt Will Perform Lower Body Bathing: with supervision;sit to/from stand Pt Will Perform Lower Body Dressing: with supervision;sit to/from stand Pt Will Transfer to Toilet: with supervision;ambulating  OT Frequency: Min 2X/week   Barriers to D/C:             Co-evaluation PT/OT/SLP Co-Evaluation/Treatment: Yes Reason for Co-Treatment: To address functional/ADL transfers;Other (comment) (pt with lower endurance, frail appearing condition)   OT goals addressed during session: ADL's and self-care      AM-PAC OT "6 Clicks" Daily Activity     Outcome Measure Help from another person eating meals?: A Little Help from another person taking care of personal grooming?: A Little Help from another person toileting, which includes using toliet, bedpan, or urinal?: A Lot Help from another person bathing (including washing, rinsing, drying)?: A Little Help from another person to put on and taking off regular upper body clothing?: A Little Help from another person to put on and taking off regular lower body clothing?: A Lot 6 Click Score: 16   End of Session Equipment Utilized During Treatment: Gait belt;Rolling walker (2 wheels) Nurse Communication: Mobility status  Activity Tolerance: Patient tolerated treatment well Patient left: in bed;with call bell/phone within reach  OT Visit Diagnosis: Unsteadiness on feet (R26.81);Other abnormalities of gait and mobility (R26.89);Muscle weakness (generalized) (M62.81)                Time: 1610-9604 OT Time Calculation (min): 32 min Charges:  OT General Charges $OT Visit: 1 Visit OT Evaluation $OT Eval Moderate Complexity: 1 Mod  Malachy Chamber, OTR/L Acute Rehab Services Office: [phone]   Layla Maw 11/21/2020, 9:35 AM

## 2020-11-21 NOTE — Progress Notes (Signed)
Placed  on a bedpan  complaining that is not coming out. Tried to give warm prune juice   but  took only a sip. When about to take out the bedpan noted that pt right hand  is in her  rectum trying to disimpact herself. Bowel is in large amount brown in color. No bleeding noted. Cleansed, bed pad changed. Continue to monitor.

## 2020-11-21 NOTE — Progress Notes (Signed)
PROGRESS NOTE    Sheena Morrow  FOY:[id number] DOB: 1933-12-17 DOA: 11/17/2020 PCP: Claretta Fraise, MD   Brief Narrative: 87 year old with past medical history significant for CAD, CHF preserved ejection fraction, chronic A. fib, remote CVA, chronic bilateral lower extremity venous insufficiency and venous stasis dermatitis, colonic AVM with chronic GI bleed and anemia who declined endoscopy and colonoscopy at outside hospital during her admission during September, decision was to stop anticoagulation.  Patient was sent to the ED by PCP due to anasarca and oliguria.  Patient presented complaining of shortness of breath, orthopnea and lower extremity edema.  Patient stopped taking Lasix since her last hospitalization. Patient admitted with CHF exacerbation.  Patient was also found to have 1 blood culture positive for strep.   Assessment & Plan:   Principal Problem:   CHF exacerbation ([place]) Active Problems:   Acute urinary retention   Acute-on-chronic kidney injury ([place])   Atrial fibrillation, chronic (H[facility])   Hypothermia   Protein-calorie malnutrition, severe   Pressure injury of skin  1-Acute on Chronic Diastolic exacerbation: Presented with dyspnea, positive JVD, bilateral crackers bilateral lower extremity edema.  Elevated BNP 687, chest x-ray pulmonary edema ECHO 60 to 65%. Diastolic dysfunction.  Continue with  Lasix to 60 mg twice daily Negative 4 l. Still with BL crackles will continue with IV lasix.   2--1 blood culture positive for strep: Infectious disease consultation, think this is contaminate.  Monitor off antibiotics.   3-AKI on CKD stage IIIb: Likely related to heart failure exacerbation. Presented with creatinine of 2.0. Creatinine baseline: 1.5 Continue to monitor. Cr down to 1.9--1.8  Acute urinary retention: Had in and out. Has foley catheter currently. Start flomax. Will need voiding trial  Chronic A. fib: Bradycardia: Continue to hold Cardizem Not on  anticoagulation due to chronic blood loss  Hypothermia: could be related to  Related to hypothyroidism vs infection   Elevation of TSH: Repeated TSH 18,000, free T3 free T4 0.6 Started  low dose synthroid.   Chronic Blood loss anemia: Admitted twice in July and in September at outside facility and required multiple blood transfusion.  Declined endoscopy colonoscopy. Anticoagulation has been discontinued. Hb at 7 on admission.  She received 2 unit of packed red blood cell in the ED.  Continue to monitor Hb stable.   Hyperkalemia: On IV Lasix monitor Severe protein calorie malnutrition, hypoalbuminemia: On Ensure.   Transaminases: Follow trend Stable.   CAD, troponin elevation: Likely demand ischemia in the setting of heart failure.  Chronic bilateral lower extremity venous insufficiency Doppler negative for DVT  History of CVA chronic pain syndrome On methadone and gabapentin.   Pressure ulcer: Wound care consulted.   Pressure Injury 11/18/20 Sacrum Stage 1 -  Intact skin with non-blanchable redness of a localized area usually over a bony prominence. (Active)  11/18/20 1534  Location: Sacrum  Location Orientation:   Staging: Stage 1 -  Intact skin with non-blanchable redness of a localized area usually over a bony prominence.  Wound Description (Comments):   Present on Admission: Yes     Pressure Injury 11/18/20 Heel Right Stage 2 -  Partial thickness loss of dermis presenting as a shallow open injury with a red, pink wound bed without slough. scabbed (Active)  11/18/20 1752  Location: Heel  Location Orientation: Right  Staging: Stage 2 -  Partial thickness loss of dermis presenting as a shallow open injury with a red, pink wound bed without slough.  Wound Description (Comments): scabbed  Present on Admission:  Yes     Nutrition Problem: Severe Malnutrition Etiology: chronic illness (CHF, CKD stage IIIb)    Signs/Symptoms: severe fat depletion, severe muscle  depletion    Interventions: Ensure Enlive (each supplement provides 350kcal and 20 grams of protein), Magic cup, Liberalize Diet  Estimated body mass index is 25.28 kg/m as calculated from the following:   Height as of 09/29/20: 5\' 1"  (1.549 m).   Weight as of this encounter: 60.7 kg.   DVT prophylaxis: SCD Code Status: DNR Family Communication: Daughter over phone 10/22. Palliative care consulted for goals of care discussion, patient with multiples commorbidities, live at home alone, would she benefit from palliative care hospice care at home ?  Disposition Plan:  Status is: Inpatient  Remains inpatient appropriate because: Chronic diastolic heart failure exacerbation, bacteremia        Consultants:  ID  Procedures:  ECHO  Antimicrobials:    Subjective: She is alert, getting ready to work with PT and OT.  Still report SOB.   Objective: Vitals:   11/21/20 0200 11/21/20 0300 11/21/20 0400 11/21/20 0738  BP: (!) 114/43 (!) 108/40 (!) 111/44 (!) 114/44  Pulse: (!) 56 (!) 54 (!) 54 61  Resp: 15 17 16 17   Temp:   98.2 F (36.8 C)   TempSrc:   Oral   SpO2: 94% 97% 95% 90%  Weight:        Intake/Output Summary (Last 24 hours) at 11/21/2020 0855 Last data filed at 11/21/2020 0400 Gross per 24 hour  Intake --  Output 2450 ml  Net -2450 ml    Filed Weights   11/19/20 0415  Weight: 60.7 kg    Examination:  General exam: Frail, thin appearing Respiratory system: BL crackles.  Cardiovascular system: S 1, S 2 RRR, positive JVD Gastrointestinal system: BS present, soft, nt Central nervous system: alert Extremities: decrease edema and LE redness improved.    Data Reviewed: I have personally reviewed following labs and imaging studies  CBC: Recent Labs  Lab 11/17/20 1744 11/18/20 0258 11/18/20 0911 11/19/20 0100 11/20/20 1100  WBC 7.4  --  7.7 9.8 11.5*  NEUTROABS 4.9  --   --   --   --   HGB 7.2* 8.7* 9.5* 10.0* 11.0*  HCT 25.3* 29.5* 31.9* 32.1*  36.7  MCV 89.1  --  86.9 84.9 87.2  PLT 328  --  299 335 193    Basic Metabolic Panel: Recent Labs  Lab 11/17/20 1744 11/18/20 0258 11/19/20 0100 11/20/20 1100 11/20/20 2059  NA 133* 133* 135 135 133*  K 5.3* 4.5 3.7 3.5 3.6  CL 95* 97* 97* 95* 93*  CO2 25 26 28 31 29   GLUCOSE 77 77 107* 104* 146*  BUN 39* 39* 43* 43* 44*  CREATININE 2.03* 1.91* 2.20* 1.92* 1.81*  CALCIUM 8.7* 8.6* 8.1* 8.0* 7.8*  MG  --   --   --   --  1.8    GFR: Estimated Creatinine Clearance: 19 mL/min (A) (by C-G formula based on SCr of 1.81 mg/dL (H)). Liver Function Tests: Recent Labs  Lab 11/17/20 1744 11/19/20 0100  AST 187* 144*  ALT 81* 82*  ALKPHOS 95 91  BILITOT 0.7 0.7  PROT 7.6 6.6  ALBUMIN 1.8* <1.5*    No results for input(s): LIPASE, AMYLASE in the last 168 hours. No results for input(s): AMMONIA in the last 168 hours. Coagulation Profile: No results for input(s): INR, PROTIME in the last 168 hours. Cardiac Enzymes: No results for input(s):  CKTOTAL, CKMB, CKMBINDEX, TROPONINI in the last 168 hours. BNP (last 3 results) No results for input(s): PROBNP in the last 8760 hours. HbA1C: No results for input(s): HGBA1C in the last 72 hours. CBG: No results for input(s): GLUCAP in the last 168 hours. Lipid Profile: No results for input(s): CHOL, HDL, LDLCALC, TRIG, CHOLHDL, LDLDIRECT in the last 72 hours. Thyroid Function Tests: Recent Labs    11/20/20 0102  TSH 18.896*  FREET4 0.66    Anemia Panel: No results for input(s): VITAMINB12, FOLATE, FERRITIN, TIBC, IRON, RETICCTPCT in the last 72 hours. Sepsis Labs: Recent Labs  Lab 11/18/20 0258  PROCALCITON 0.13     Recent Results (from the past 240 hour(s))  Resp Panel by RT-PCR (Flu A&B, Covid) Nasopharyngeal Swab     Status: None   Collection Time: 11/17/20  5:44 PM   Specimen: Nasopharyngeal Swab; Nasopharyngeal(NP) swabs in vial transport medium  Result Value Ref Range Status   SARS Coronavirus 2 by RT PCR  NEGATIVE NEGATIVE Final    Comment: (NOTE) SARS-CoV-2 target nucleic acids are NOT DETECTED.  The SARS-CoV-2 RNA is generally detectable in upper respiratory specimens during the acute phase of infection. The lowest concentration of SARS-CoV-2 viral copies this assay can detect is 138 copies/mL. A negative result does not preclude SARS-Cov-2 infection and should not be used as the sole basis for treatment or other patient management decisions. A negative result may occur with  improper specimen collection/handling, submission of specimen other than nasopharyngeal swab, presence of viral mutation(s) within the areas targeted by this assay, and inadequate number of viral copies(<138 copies/mL). A negative result must be combined with clinical observations, patient history, and epidemiological information. The expected result is Negative.  Fact Sheet for Patients:  EntrepreneurPulse.com.au  Fact Sheet for Healthcare Providers:  IncredibleEmployment.be  This test is no t yet approved or cleared by the [place] FDA and  has been authorized for detection and/or diagnosis of SARS-CoV-2 by FDA under an Emergency Use Authorization (EUA). This EUA will remain  in effect (meaning this test can be used) for the duration of the COVID-19 declaration under Section 564(b)(1) of the Act, 21 U.S.C.section 360bbb-3(b)(1), unless the authorization is terminated  or revoked sooner.       Influenza A by PCR NEGATIVE NEGATIVE Final   Influenza B by PCR NEGATIVE NEGATIVE Final    Comment: (NOTE) The Xpert Xpress SARS-CoV-2/FLU/RSV plus assay is intended as an aid in the diagnosis of influenza from Nasopharyngeal swab specimens and should not be used as a sole basis for treatment. Nasal washings and aspirates are unacceptable for Xpert Xpress SARS-CoV-2/FLU/RSV testing.  Fact Sheet for Patients: EntrepreneurPulse.com.au  Fact Sheet for  Healthcare Providers: IncredibleEmployment.be  This test is not yet approved or cleared by the [place] FDA and has been authorized for detection and/or diagnosis of SARS-CoV-2 by FDA under an Emergency Use Authorization (EUA). This EUA will remain in effect (meaning this test can be used) for the duration of the COVID-19 declaration under Section 564(b)(1) of the Act, 21 U.S.C. section 360bbb-3(b)(1), unless the authorization is terminated or revoked.  Performed at [facility], [place] [address], [place], [place] [address]   Culture, blood (routine x 2)     Status: None (Preliminary result)   Collection Time: 11/18/20  3:00 AM   Specimen: BLOOD LEFT HAND  Result Value Ref Range Status   Specimen Description BLOOD LEFT HAND  Final   Special Requests   Final  BOTTLES DRAWN AEROBIC AND ANAEROBIC Blood Culture results may not be optimal due to an inadequate volume of blood received in culture bottles   Culture   Final    NO GROWTH 3 DAYS Performed at [facility], [place] [address], [place], [place] [address]    Report Status PENDING  Incomplete  Culture, blood (routine x 2)     Status: Abnormal   Collection Time: 11/18/20  3:06 AM   Specimen: BLOOD RIGHT HAND  Result Value Ref Range Status   Specimen Description BLOOD RIGHT HAND  Final   Special Requests   Final    BOTTLES DRAWN AEROBIC AND ANAEROBIC Blood Culture results may not be optimal due to an inadequate volume of blood received in culture bottles   Culture  Setup Time   Final    GRAM POSITIVE COCCI AEROBIC BOTTLE ONLY CRITICAL RESULT CALLED TO, READ BACK BY AND VERIFIED WITH: PHARMD JAMES LEDFORD 11/19/20@4 :11 BY TW    Culture (A)  Final    VIRIDANS STREPTOCOCCUS THE SIGNIFICANCE OF ISOLATING THIS ORGANISM FROM A SINGLE SET OF BLOOD CULTURES WHEN MULTIPLE SETS ARE DRAWN IS UNCERTAIN. PLEASE NOTIFY THE MICROBIOLOGY DEPARTMENT WITHIN ONE WEEK IF SPECIATION AND SENSITIVITIES ARE  REQUIRED. Performed at [facility], [place] [address] [place], [place] [address]    Report Status 11/20/2020 FINAL  Final  Blood Culture ID Panel (Reflexed)     Status: Abnormal   Collection Time: 11/18/20  3:06 AM  Result Value Ref Range Status   Enterococcus faecalis NOT DETECTED NOT DETECTED Final   Enterococcus Faecium NOT DETECTED NOT DETECTED Final   Listeria monocytogenes NOT DETECTED NOT DETECTED Final   Staphylococcus species NOT DETECTED NOT DETECTED Final   Staphylococcus aureus (BCID) NOT DETECTED NOT DETECTED Final   Staphylococcus epidermidis NOT DETECTED NOT DETECTED Final   Staphylococcus lugdunensis NOT DETECTED NOT DETECTED Final   Streptococcus species DETECTED (A) NOT DETECTED Final    Comment: Not Enterococcus species, Streptococcus agalactiae, Streptococcus pyogenes, or Streptococcus pneumoniae. CRITICAL RESULT CALLED TO, READ BACK BY AND VERIFIED WITH: PHARMD JAMES LEDFORD 11/19/20@4 :11 BY TW    Streptococcus agalactiae NOT DETECTED NOT DETECTED Final   Streptococcus pneumoniae NOT DETECTED NOT DETECTED Final   Streptococcus pyogenes NOT DETECTED NOT DETECTED Final   A.calcoaceticus-baumannii NOT DETECTED NOT DETECTED Final   Bacteroides fragilis NOT DETECTED NOT DETECTED Final   Enterobacterales NOT DETECTED NOT DETECTED Final   Enterobacter cloacae complex NOT DETECTED NOT DETECTED Final   Escherichia coli NOT DETECTED NOT DETECTED Final   Klebsiella aerogenes NOT DETECTED NOT DETECTED Final   Klebsiella oxytoca NOT DETECTED NOT DETECTED Final   Klebsiella pneumoniae NOT DETECTED NOT DETECTED Final   Proteus species NOT DETECTED NOT DETECTED Final   Salmonella species NOT DETECTED NOT DETECTED Final   Serratia marcescens NOT DETECTED NOT DETECTED Final   Haemophilus influenzae NOT DETECTED NOT DETECTED Final   Neisseria meningitidis NOT DETECTED NOT DETECTED Final   Pseudomonas aeruginosa NOT DETECTED NOT DETECTED Final   Stenotrophomonas maltophilia  NOT DETECTED NOT DETECTED Final   Candida albicans NOT DETECTED NOT DETECTED Final   Candida auris NOT DETECTED NOT DETECTED Final   Candida glabrata NOT DETECTED NOT DETECTED Final   Candida krusei NOT DETECTED NOT DETECTED Final   Candida parapsilosis NOT DETECTED NOT DETECTED Final   Candida tropicalis NOT DETECTED NOT DETECTED Final   Cryptococcus neoformans/gattii NOT DETECTED NOT DETECTED Final    Comment: Performed at [facility], [address] [place], [place] [address]          Radiology Studies: ECHOCARDIOGRAM COMPLETE  Result Date: 11/19/2020    ECHOCARDIOGRAM REPORT   Patient Name:   [facility] Date of Exam: 11/19/2020 Medical Rec #:  [id number]     Height:       61.0 in Accession #:    [id number]    Weight:       133.8 lb Date of Birth:  09-Nov-1933      BSA:          1.592 m Patient Age:    6 years      BP:           122/43 mmHg Patient Gender: F             HR:           58 bpm. Exam Location:  Inpatient Procedure: 2D Echo, Color Doppler and Cardiac Doppler Indications:    Bacteremia R78.81  History:        Patient has prior history of Echocardiogram examinations, most                 recent 06/21/2016.  Sonographer:    Merrie Roof RDCS Referring Phys: [id number] Malaiya Paczkowski A Eastin Swing IMPRESSIONS  1. Left ventricular ejection fraction, by estimation, is 60 to 65%. The left ventricle has normal function. The left ventricle has no regional wall motion abnormalities. Left ventricular diastolic parameters are consistent with Grade I diastolic dysfunction (impaired relaxation).  2. Right ventricular systolic function is normal. The right ventricular size is normal. There is normal pulmonary artery systolic pressure. The estimated right ventricular systolic pressure is 88.2 mmHg.  3. Left atrial size was severely dilated.  4. The mitral valve is normal in structure. Moderate to severe mitral valve regurgitation. No evidence of mitral stenosis.  5. Tricuspid valve regurgitation is moderate  to severe.  6. The aortic valve is normal in structure. Aortic valve regurgitation is not visualized. No aortic stenosis is present.  7. The inferior vena cava is normal in size with greater than 50% respiratory variability, suggesting right atrial pressure of 3 mmHg. Conclusion(s)/Recommendation(s): No evidence of valvular vegetations on this transthoracic echocardiogram. Would recommend a transesophageal echocardiogram to exclude infective endocarditis if clinically indicated. FINDINGS  Left Ventricle: Left ventricular ejection fraction, by estimation, is 60 to 65%. The left ventricle has normal function. The left ventricle has no regional wall motion abnormalities. The left ventricular internal cavity size was normal in size. There is  no left ventricular hypertrophy. Left ventricular diastolic parameters are consistent with Grade I diastolic dysfunction (impaired relaxation). Right Ventricle: The right ventricular size is normal. No increase in right ventricular wall thickness. Right ventricular systolic function is normal. There is normal pulmonary artery systolic pressure. The tricuspid regurgitant velocity is 2.60 m/s, and  with an assumed right atrial pressure of 8 mmHg, the estimated right ventricular systolic pressure is 80.0 mmHg. Left Atrium: Left atrial size was severely dilated. Right Atrium: Right atrial size was normal in size. Pericardium: There is no evidence of pericardial effusion. Mitral Valve: The mitral valve is normal in structure. Moderate to severe mitral valve regurgitation. No evidence of mitral valve stenosis. Tricuspid Valve: The tricuspid valve is normal in structure. Tricuspid valve regurgitation is moderate to severe. No evidence of tricuspid stenosis. Aortic Valve: The aortic valve is normal in structure. Aortic valve regurgitation is not visualized. No aortic stenosis is present. Aortic valve mean gradient measures 7.0 mmHg.  Aortic valve peak gradient measures 12.4 mmHg. Pulmonic  Valve: The pulmonic valve was normal in structure. Pulmonic valve regurgitation is mild to moderate. No evidence of pulmonic stenosis. Aorta: The aortic root is normal in size and structure. Venous: The inferior vena cava is normal in size with greater than 50% respiratory variability, suggesting right atrial pressure of 3 mmHg. IAS/Shunts: No atrial level shunt detected by color flow Doppler.  LEFT VENTRICLE PLAX 2D LVIDd:         5.10 cm LVIDs:         3.30 cm LV PW:         0.90 cm LV IVS:        1.00 cm  RIGHT VENTRICLE             IVC RV Basal diam:  3.70 cm     IVC diam: 2.50 cm RV S prime:     10.90 cm/s TAPSE (M-mode): 1.5 cm LEFT ATRIUM              Index        RIGHT ATRIUM           Index LA diam:        4.50 cm  2.83 cm/m   RA Area:     23.30 cm LA Vol (A2C):   99.9 ml  62.74 ml/m  RA Volume:   66.30 ml  41.64 ml/m LA Vol (A4C):   96.7 ml  60.73 ml/m LA Biplane Vol: 107.0 ml 67.20 ml/m  AORTIC VALVE AV Vmax:           176.00 cm/s AV Vmean:          121.000 cm/s AV VTI:            0.329 m AV Peak Grad:      12.4 mmHg AV Mean Grad:      7.0 mmHg LVOT Vmax:         101.00 cm/s LVOT Vmean:        60.900 cm/s LVOT VTI:          0.188 m LVOT/AV VTI ratio: 0.57  AORTA Ao Root diam: 3.40 cm Ao Asc diam:  3.10 cm MR Peak grad:    112.8 mmHg   TRICUSPID VALVE MR Mean grad:    80.0 mmHg    TR Peak grad:   27.0 mmHg MR Vmax:         531.00 cm/s  TR Vmax:        260.00 cm/s MR Vmean:        430.0 cm/s MR PISA:         1.01 cm     SHUNTS MR PISA Eff ROA: 7 mm        Systemic VTI: 0.19 m MR PISA Radius:  0.40 cm Candee Furbish MD Electronically signed by Candee Furbish MD Signature Date/Time: 11/19/2020/4:42:52 PM    Final         Scheduled Meds:  atorvastatin  20 mg Oral Daily   Chlorhexidine Gluconate Cloth  6 each Topical Q0600   feeding supplement  237 mL Oral BID BM   ferrous sulfate  325 mg Oral Daily   furosemide  60 mg Intravenous BID   gabapentin  1,600 mg Oral QHS   gabapentin  800 mg Oral BID  WC   levothyroxine  25 mcg Oral Q0600   linaclotide  290 mcg Oral QAC breakfast   methadone  10 mg Oral TID  montelukast  10 mg Oral QHS   pantoprazole  40 mg Oral Daily   Continuous Infusions:   LOS: 4 days    Time spent: 35 minutes.     Elmarie Shiley, MD [facility]   If 7PM-7AM, please contact night-coverage [email or web address]  11/21/2020, 8:55 AM

## 2020-11-22 ENCOUNTER — Ambulatory Visit (INDEPENDENT_AMBULATORY_CARE_PROVIDER_SITE_OTHER): Payer: Medicare HMO

## 2020-11-22 DIAGNOSIS — K219 Gastro-esophageal reflux disease without esophagitis: Secondary | ICD-10-CM

## 2020-11-22 DIAGNOSIS — I5032 Chronic diastolic (congestive) heart failure: Secondary | ICD-10-CM | POA: Diagnosis not present

## 2020-11-22 DIAGNOSIS — K5904 Chronic idiopathic constipation: Secondary | ICD-10-CM

## 2020-11-22 DIAGNOSIS — I252 Old myocardial infarction: Secondary | ICD-10-CM

## 2020-11-22 DIAGNOSIS — J45909 Unspecified asthma, uncomplicated: Secondary | ICD-10-CM

## 2020-11-22 DIAGNOSIS — N184 Chronic kidney disease, stage 4 (severe): Secondary | ICD-10-CM

## 2020-11-22 DIAGNOSIS — I272 Pulmonary hypertension, unspecified: Secondary | ICD-10-CM

## 2020-11-22 DIAGNOSIS — B0229 Other postherpetic nervous system involvement: Secondary | ICD-10-CM | POA: Diagnosis not present

## 2020-11-22 DIAGNOSIS — D500 Iron deficiency anemia secondary to blood loss (chronic): Secondary | ICD-10-CM | POA: Diagnosis not present

## 2020-11-22 DIAGNOSIS — G894 Chronic pain syndrome: Secondary | ICD-10-CM | POA: Diagnosis not present

## 2020-11-22 DIAGNOSIS — I130 Hypertensive heart and chronic kidney disease with heart failure and stage 1 through stage 4 chronic kidney disease, or unspecified chronic kidney disease: Secondary | ICD-10-CM

## 2020-11-22 DIAGNOSIS — I872 Venous insufficiency (chronic) (peripheral): Secondary | ICD-10-CM

## 2020-11-22 DIAGNOSIS — I25119 Atherosclerotic heart disease of native coronary artery with unspecified angina pectoris: Secondary | ICD-10-CM

## 2020-11-22 DIAGNOSIS — I482 Chronic atrial fibrillation, unspecified: Secondary | ICD-10-CM | POA: Diagnosis not present

## 2020-11-22 DIAGNOSIS — I890 Lymphedema, not elsewhere classified: Secondary | ICD-10-CM

## 2020-11-22 DIAGNOSIS — D631 Anemia in chronic kidney disease: Secondary | ICD-10-CM

## 2020-11-22 DIAGNOSIS — I5033 Acute on chronic diastolic (congestive) heart failure: Secondary | ICD-10-CM | POA: Diagnosis not present

## 2020-11-22 DIAGNOSIS — R7881 Bacteremia: Secondary | ICD-10-CM

## 2020-11-22 DIAGNOSIS — K5520 Angiodysplasia of colon without hemorrhage: Secondary | ICD-10-CM

## 2020-11-22 DIAGNOSIS — D539 Nutritional anemia, unspecified: Secondary | ICD-10-CM

## 2020-11-22 LAB — T3, FREE: T3, Free: 1.3 pg/mL — ABNORMAL LOW (ref 2.0–4.4)

## 2020-11-22 MED ORDER — IPRATROPIUM-ALBUTEROL 0.5-2.5 (3) MG/3ML IN SOLN
3.0000 mL | Freq: Four times a day (QID) | RESPIRATORY_TRACT | Status: DC | PRN
  Administered 2020-11-22 – 2020-11-25 (×6): 3 mL via RESPIRATORY_TRACT
  Filled 2020-11-22 (×7): qty 3

## 2020-11-22 MED ORDER — POLYETHYLENE GLYCOL 3350 17 G PO PACK
17.0000 g | PACK | Freq: Every day | ORAL | Status: DC
  Filled 2020-11-22: qty 1

## 2020-11-22 MED ORDER — POTASSIUM CHLORIDE CRYS ER 20 MEQ PO TBCR
20.0000 meq | EXTENDED_RELEASE_TABLET | Freq: Once | ORAL | Status: AC
  Administered 2020-11-22: 20 meq via ORAL
  Filled 2020-11-22: qty 1

## 2020-11-22 MED ORDER — MORPHINE SULFATE (CONCENTRATE) 10 MG/0.5ML PO SOLN: 5.0000 mg | Freq: Four times a day (QID) | ORAL | Status: DC

## 2020-11-22 MED ORDER — LORAZEPAM 1 MG PO TABS
1.0000 mg | ORAL_TABLET | Freq: Four times a day (QID) | ORAL | Status: DC | PRN
  Administered 2020-11-24 – 2020-11-26 (×4): 1 mg via ORAL
  Filled 2020-11-22 (×4): qty 1

## 2020-11-22 MED ORDER — IPRATROPIUM-ALBUTEROL 0.5-2.5 (3) MG/3ML IN SOLN: 3.0000 mL | Freq: Four times a day (QID) | RESPIRATORY_TRACT | Status: DC

## 2020-11-22 MED ORDER — SENNOSIDES-DOCUSATE SODIUM 8.6-50 MG PO TABS
1.0000 | ORAL_TABLET | Freq: Two times a day (BID) | ORAL | Status: DC
  Administered 2020-11-22 – 2020-11-26 (×8): 1 via ORAL
  Filled 2020-11-22 (×8): qty 1

## 2020-11-22 MED ORDER — MORPHINE SULFATE 10 MG/5ML PO SOLN
5.0000 mg | Freq: Four times a day (QID) | ORAL | Status: DC
  Filled 2020-11-22: qty 4

## 2020-11-22 MED ORDER — BISACODYL 10 MG RE SUPP: 10.0000 mg | Freq: Every day | RECTAL | Status: DC | PRN

## 2020-11-22 MED ORDER — MORPHINE SULFATE 10 MG/5ML PO SOLN: 5.0000 mg | Freq: Four times a day (QID) | ORAL | Status: DC

## 2020-11-22 NOTE — Progress Notes (Signed)
Nutrition Brief Note  Chart reviewed. Pt has transitioned to comfort care. Recommend liberalizing diet to Regular. No further nutrition interventions planned at this time.  Please re-consult as needed.    Gustavus Bryant, MS, RD, LDN Inpatient Clinical Dietitian Please see AMiON for contact information.

## 2020-11-22 NOTE — Progress Notes (Signed)
With expiratory wheezing, breathing tx albuterol given, wheezing subsided.

## 2020-11-22 NOTE — Progress Notes (Signed)
PROGRESS NOTE    Sheena Morrow  ZJI:[id number] DOB: 09-16-1933 DOA: 11/17/2020 PCP: Claretta Fraise, MD   Brief Narrative: 87 year old with past medical history significant for CAD, CHF preserved ejection fraction, chronic A. fib, remote CVA, chronic bilateral lower extremity venous insufficiency and venous stasis dermatitis, colonic AVM with chronic GI bleed and anemia who declined endoscopy and colonoscopy at outside hospital during her admission during September, decision was to stop anticoagulation.  Patient was sent to the ED by PCP due to anasarca and oliguria.  Patient presented complaining of shortness of breath, orthopnea and lower extremity edema.  Patient stopped taking Lasix since her last hospitalization. Patient admitted with CHF exacerbation.  Patient was also found to have 1 blood culture positive for strep, thought to be contamination. ID following off antibiotics.    Assessment & Plan:   Principal Problem:   CHF exacerbation ([place]) Active Problems:   Acute urinary retention   Acute-on-chronic kidney injury ([place])   Atrial fibrillation, chronic (HCC)   Hypothermia   Protein-calorie malnutrition, severe   Pressure injury of skin  1-Acute on Chronic Diastolic exacerbation: Presented with dyspnea, positive JVD, bilateral crackers bilateral lower extremity edema.  Elevated BNP 687, chest x-ray pulmonary edema ECHO 60 to 65%. Diastolic dysfunction.  Continue with  Lasix to 60 mg twice daily Patient still having SOB, plan to continue with IV lasix. Nebulizer ordered.  Patient has transition to Comfort care, Palliative will re-evaluate patient in 24--48 hours for TOC>   2--1 blood culture positive for strep: Infectious disease consultation, think this is contaminate.  Monitor off antibiotics.   3-AKI on CKD stage IIIb: Likely related to heart failure exacerbation. Presented with creatinine of 2.0. Creatinine baseline: 1.5 Continue to monitor. Cr down to 1.9--1.8  Acute  urinary retention: Had in and out. Has foley catheter currently.   Chronic A. fib: Bradycardia: Continue to hold Cardizem Not on anticoagulation due to chronic blood loss  Hypothermia: could be related to  Related to hypothyroidism vs infection   Elevation of TSH: Repeated TSH 18,000, free T3 free T4 0.6 Started  low dose synthroid.   Chronic Blood loss anemia: Admitted twice in July and in September at outside facility and required multiple blood transfusion.  Declined endoscopy colonoscopy. Anticoagulation has been discontinued. Hb at 7 on admission.  She received 2 unit of packed red blood cell in the ED.  Continue to monitor Hb stable.   Hyperkalemia: On IV Lasix monitor Severe protein calorie malnutrition, hypoalbuminemia: On Ensure.   Transaminases: Follow trend Stable.   CAD, troponin elevation: Likely demand ischemia in the setting of heart failure.  Chronic bilateral lower extremity venous insufficiency Doppler negative for DVT  History of CVA chronic pain syndrome On methadone and gabapentin.   Pressure ulcer: Wound care consulted.   Pressure Injury 11/18/20 Sacrum Stage 1 -  Intact skin with non-blanchable redness of a localized area usually over a bony prominence. (Active)  11/18/20 1534  Location: Sacrum  Location Orientation:   Staging: Stage 1 -  Intact skin with non-blanchable redness of a localized area usually over a bony prominence.  Wound Description (Comments):   Present on Admission: Yes     Pressure Injury 11/18/20 Heel Right Stage 2 -  Partial thickness loss of dermis presenting as a shallow open injury with a red, pink wound bed without slough. scabbed (Active)  11/18/20 1752  Location: Heel  Location Orientation: Right  Staging: Stage 2 -  Partial thickness loss of dermis presenting as a  shallow open injury with a red, pink wound bed without slough.  Wound Description (Comments): scabbed  Present on Admission: Yes     Nutrition  Problem: Severe Malnutrition Etiology: chronic illness (CHF, CKD stage IIIb)    Signs/Symptoms: severe fat depletion, severe muscle depletion    Interventions: Ensure Enlive (each supplement provides 350kcal and 20 grams of protein), Magic cup, Liberalize Diet  Estimated body mass index is 25.28 kg/m as calculated from the following:   Height as of 09/29/20: 5\' 1"  (1.549 m).   Weight as of this encounter: 60.7 kg.   DVT prophylaxis: SCD Code Status: DNR Family Communication: Daughter over phone 10/23. Disposition Plan:  Status is: Inpatient  Remains inpatient appropriate because: Chronic diastolic heart failure exacerbation, bacteremia        Consultants:  ID  Procedures:  ECHO  Antimicrobials:    Subjective: She is alert, getting ready to work with PT and OT.  Still report SOB.   Objective: Vitals:   11/21/20 0300 11/21/20 0400 11/21/20 0738 11/21/20 2018  BP: (!) 108/40 (!) 111/44 (!) 114/44 (!) 126/44  Pulse: (!) 54 (!) 54 61 64  Resp: 17 16 17 20   Temp:  98.2 F (36.8 C)  98.3 F (36.8 C)  TempSrc:  Oral  Axillary  SpO2: 97% 95% 90% 93%  Weight:        Intake/Output Summary (Last 24 hours) at 11/22/2020 0721 Last data filed at 11/22/2020 0630 Gross per 24 hour  Intake 357 ml  Output 3150 ml  Net -2793 ml    Filed Weights   11/19/20 0415  Weight: 60.7 kg    Examination:  General exam: Frail, thin appearing.  Respiratory system: BL crackles.  Cardiovascular system: S 1, S 2 RRR Gastrointestinal system: BS present, soft, nt Central nervous system: Alert Extremities: less redness.    Data Reviewed: I have personally reviewed following labs and imaging studies  CBC: Recent Labs  Lab 11/17/20 1744 11/18/20 0258 11/18/20 0911 11/19/20 0100 11/20/20 1100  WBC 7.4  --  7.7 9.8 11.5*  NEUTROABS 4.9  --   --   --   --   HGB 7.2* 8.7* 9.5* 10.0* 11.0*  HCT 25.3* 29.5* 31.9* 32.1* 36.7  MCV 89.1  --  86.9 84.9 87.2  PLT 328  --  299  335 098    Basic Metabolic Panel: Recent Labs  Lab 11/17/20 1744 11/18/20 0258 11/19/20 0100 11/20/20 1100 11/20/20 2059  NA 133* 133* 135 135 133*  K 5.3* 4.5 3.7 3.5 3.6  CL 95* 97* 97* 95* 93*  CO2 25 26 28 31 29   GLUCOSE 77 77 107* 104* 146*  BUN 39* 39* 43* 43* 44*  CREATININE 2.03* 1.91* 2.20* 1.92* 1.81*  CALCIUM 8.7* 8.6* 8.1* 8.0* 7.8*  MG  --   --   --   --  1.8    GFR: Estimated Creatinine Clearance: 19 mL/min (A) (by C-G formula based on SCr of 1.81 mg/dL (H)). Liver Function Tests: Recent Labs  Lab 11/17/20 1744 11/19/20 0100  AST 187* 144*  ALT 81* 82*  ALKPHOS 95 91  BILITOT 0.7 0.7  PROT 7.6 6.6  ALBUMIN 1.8* <1.5*    No results for input(s): LIPASE, AMYLASE in the last 168 hours. No results for input(s): AMMONIA in the last 168 hours. Coagulation Profile: No results for input(s): INR, PROTIME in the last 168 hours. Cardiac Enzymes: No results for input(s): CKTOTAL, CKMB, CKMBINDEX, TROPONINI in the last 168 hours. BNP (  last 3 results) No results for input(s): PROBNP in the last 8760 hours. HbA1C: No results for input(s): HGBA1C in the last 72 hours. CBG: No results for input(s): GLUCAP in the last 168 hours. Lipid Profile: No results for input(s): CHOL, HDL, LDLCALC, TRIG, CHOLHDL, LDLDIRECT in the last 72 hours. Thyroid Function Tests: Recent Labs    11/20/20 0102  TSH 18.896*  FREET4 0.66  T3FREE 1.3*    Anemia Panel: No results for input(s): VITAMINB12, FOLATE, FERRITIN, TIBC, IRON, RETICCTPCT in the last 72 hours. Sepsis Labs: Recent Labs  Lab 11/18/20 0258  PROCALCITON 0.13     Recent Results (from the past 240 hour(s))  Resp Panel by RT-PCR (Flu A&B, Covid) Nasopharyngeal Swab     Status: None   Collection Time: 11/17/20  5:44 PM   Specimen: Nasopharyngeal Swab; Nasopharyngeal(NP) swabs in vial transport medium  Result Value Ref Range Status   SARS Coronavirus 2 by RT PCR NEGATIVE NEGATIVE Final    Comment:  (NOTE) SARS-CoV-2 target nucleic acids are NOT DETECTED.  The SARS-CoV-2 RNA is generally detectable in upper respiratory specimens during the acute phase of infection. The lowest concentration of SARS-CoV-2 viral copies this assay can detect is 138 copies/mL. A negative result does not preclude SARS-Cov-2 infection and should not be used as the sole basis for treatment or other patient management decisions. A negative result may occur with  improper specimen collection/handling, submission of specimen other than nasopharyngeal swab, presence of viral mutation(s) within the areas targeted by this assay, and inadequate number of viral copies(<138 copies/mL). A negative result must be combined with clinical observations, patient history, and epidemiological information. The expected result is Negative.  Fact Sheet for Patients:  EntrepreneurPulse.com.au  Fact Sheet for Healthcare Providers:  IncredibleEmployment.be  This test is no t yet approved or cleared by the [place] FDA and  has been authorized for detection and/or diagnosis of SARS-CoV-2 by FDA under an Emergency Use Authorization (EUA). This EUA will remain  in effect (meaning this test can be used) for the duration of the COVID-19 declaration under Section 564(b)(1) of the Act, 21 U.S.C.section 360bbb-3(b)(1), unless the authorization is terminated  or revoked sooner.       Influenza A by PCR NEGATIVE NEGATIVE Final   Influenza B by PCR NEGATIVE NEGATIVE Final    Comment: (NOTE) The Xpert Xpress SARS-CoV-2/FLU/RSV plus assay is intended as an aid in the diagnosis of influenza from Nasopharyngeal swab specimens and should not be used as a sole basis for treatment. Nasal washings and aspirates are unacceptable for Xpert Xpress SARS-CoV-2/FLU/RSV testing.  Fact Sheet for Patients: EntrepreneurPulse.com.au  Fact Sheet for Healthcare  Providers: IncredibleEmployment.be  This test is not yet approved or cleared by the [place] FDA and has been authorized for detection and/or diagnosis of SARS-CoV-2 by FDA under an Emergency Use Authorization (EUA). This EUA will remain in effect (meaning this test can be used) for the duration of the COVID-19 declaration under Section 564(b)(1) of the Act, 21 U.S.C. section 360bbb-3(b)(1), unless the authorization is terminated or revoked.  Performed at [facility], [place] [address] [place], [place] [address]   Culture, blood (routine x 2)     Status: None (Preliminary result)   Collection Time: 11/18/20  3:00 AM   Specimen: BLOOD LEFT HAND  Result Value Ref Range Status   Specimen Description BLOOD LEFT HAND  Final   Special Requests   Final    BOTTLES DRAWN AEROBIC AND ANAEROBIC Blood Culture  results may not be optimal due to an inadequate volume of blood received in culture bottles   Culture   Final    NO GROWTH 3 DAYS Performed at [facility], [place] [address], [place], [place] [address]    Report Status PENDING  Incomplete  Culture, blood (routine x 2)     Status: Abnormal   Collection Time: 11/18/20  3:06 AM   Specimen: BLOOD RIGHT HAND  Result Value Ref Range Status   Specimen Description BLOOD RIGHT HAND  Final   Special Requests   Final    BOTTLES DRAWN AEROBIC AND ANAEROBIC Blood Culture results may not be optimal due to an inadequate volume of blood received in culture bottles   Culture  Setup Time   Final    GRAM POSITIVE COCCI AEROBIC BOTTLE ONLY CRITICAL RESULT CALLED TO, READ BACK BY AND VERIFIED WITH: PHARMD JAMES LEDFORD 11/19/20@4 :11 BY TW    Culture (A)  Final    VIRIDANS STREPTOCOCCUS THE SIGNIFICANCE OF ISOLATING THIS ORGANISM FROM A SINGLE SET OF BLOOD CULTURES WHEN MULTIPLE SETS ARE DRAWN IS UNCERTAIN. PLEASE NOTIFY THE MICROBIOLOGY DEPARTMENT WITHIN ONE WEEK IF SPECIATION AND SENSITIVITIES ARE REQUIRED. Performed at  [facility], [place] [address], [place], [place] [address]    Report Status 11/20/2020 FINAL  Final  Blood Culture ID Panel (Reflexed)     Status: Abnormal   Collection Time: 11/18/20  3:06 AM  Result Value Ref Range Status   Enterococcus faecalis NOT DETECTED NOT DETECTED Final   Enterococcus Faecium NOT DETECTED NOT DETECTED Final   Listeria monocytogenes NOT DETECTED NOT DETECTED Final   Staphylococcus species NOT DETECTED NOT DETECTED Final   Staphylococcus aureus (BCID) NOT DETECTED NOT DETECTED Final   Staphylococcus epidermidis NOT DETECTED NOT DETECTED Final   Staphylococcus lugdunensis NOT DETECTED NOT DETECTED Final   Streptococcus species DETECTED (A) NOT DETECTED Final    Comment: Not Enterococcus species, Streptococcus agalactiae, Streptococcus pyogenes, or Streptococcus pneumoniae. CRITICAL RESULT CALLED TO, READ BACK BY AND VERIFIED WITH: PHARMD JAMES LEDFORD 11/19/20@4 :11 BY TW    Streptococcus agalactiae NOT DETECTED NOT DETECTED Final   Streptococcus pneumoniae NOT DETECTED NOT DETECTED Final   Streptococcus pyogenes NOT DETECTED NOT DETECTED Final   A.calcoaceticus-baumannii NOT DETECTED NOT DETECTED Final   Bacteroides fragilis NOT DETECTED NOT DETECTED Final   Enterobacterales NOT DETECTED NOT DETECTED Final   Enterobacter cloacae complex NOT DETECTED NOT DETECTED Final   Escherichia coli NOT DETECTED NOT DETECTED Final   Klebsiella aerogenes NOT DETECTED NOT DETECTED Final   Klebsiella oxytoca NOT DETECTED NOT DETECTED Final   Klebsiella pneumoniae NOT DETECTED NOT DETECTED Final   Proteus species NOT DETECTED NOT DETECTED Final   Salmonella species NOT DETECTED NOT DETECTED Final   Serratia marcescens NOT DETECTED NOT DETECTED Final   Haemophilus influenzae NOT DETECTED NOT DETECTED Final   Neisseria meningitidis NOT DETECTED NOT DETECTED Final   Pseudomonas aeruginosa NOT DETECTED NOT DETECTED Final   Stenotrophomonas maltophilia NOT DETECTED NOT  DETECTED Final   Candida albicans NOT DETECTED NOT DETECTED Final   Candida auris NOT DETECTED NOT DETECTED Final   Candida glabrata NOT DETECTED NOT DETECTED Final   Candida krusei NOT DETECTED NOT DETECTED Final   Candida parapsilosis NOT DETECTED NOT DETECTED Final   Candida tropicalis NOT DETECTED NOT DETECTED Final   Cryptococcus neoformans/gattii NOT DETECTED NOT DETECTED Final    Comment: Performed at [facility], [address] [place], [place] [address]  Radiology Studies: No results found.      Scheduled Meds:  Chlorhexidine Gluconate Cloth  6 each Topical Q0600   feeding supplement  237 mL Oral BID BM   furosemide  60 mg Intravenous BID   gabapentin  1,600 mg Oral QHS   gabapentin  800 mg Oral BID WC   methadone  10 mg Oral TID   montelukast  10 mg Oral QHS   pantoprazole  40 mg Oral Daily   Continuous Infusions:   LOS: 5 days    Time spent: 35 minutes.     Elmarie Shiley, MD [facility]   If 7PM-7AM, please contact night-coverage [email or web address]  11/22/2020, 7:21 AM

## 2020-11-22 NOTE — Progress Notes (Signed)
Heart Failure Navigator Progress Note  Assessed for Heart & Vascular TOC clinic readiness.  Patient does not meet criteria due to pt transitioning to comfort care with palliative medicine following.   Navigator available for reassessment of patient.   Pricilla Holm, MSN, RN Heart Failure Nurse Navigator [phone]

## 2020-11-22 NOTE — Consult Note (Signed)
   [facility] Inpatient Consult   11/22/2020  Sheena Morrow 1933/05/16 [id number]  [place] [facility] [ACO] Patient: [employer]   Primary Care Provider:  Claretta Fraise, MD [place], Embedded provider  Patient was screened for [place] [employer] services. The transition of care call is listed to be conducted by the primary care provider.   Electronic medical record reviewed for disposition and post hospital follow up transition of care plans.  Reviewed palliative consult and inpatient [facility] RNCM notes also.  Plan: Will continue to follow for disposition/transition.  Please contact for further questions,  Natividad Brood, RN BSN [facility] Liaison  [phone] business mobile phone Toll free office [phone]  Fax number: [phone] [email or web address]

## 2020-11-22 NOTE — Progress Notes (Signed)
Refused to take morphine md aware.

## 2020-11-22 NOTE — Progress Notes (Signed)
The chaplain attempted PMT referral for completing the Pt. HCPOA.  The chaplain understands from the Pt. RN-Chat the Pt. is sleeping at the time of the visit.  The chaplain will reconnect with PMT and the Pt.

## 2020-11-22 NOTE — Progress Notes (Signed)
With transfer order to med-surg unit. Awaiting availability of bed.

## 2020-11-23 DIAGNOSIS — R0609 Other forms of dyspnea: Secondary | ICD-10-CM | POA: Diagnosis not present

## 2020-11-23 DIAGNOSIS — I5033 Acute on chronic diastolic (congestive) heart failure: Secondary | ICD-10-CM | POA: Diagnosis not present

## 2020-11-23 DIAGNOSIS — Z515 Encounter for palliative care: Secondary | ICD-10-CM | POA: Diagnosis not present

## 2020-11-23 LAB — CULTURE, BLOOD (ROUTINE X 2): Culture: NO GROWTH

## 2020-11-23 MED ORDER — MORPHINE SULFATE (CONCENTRATE) 10 MG/0.5ML PO SOLN
5.0000 mg | Freq: Four times a day (QID) | ORAL | Status: AC
  Administered 2020-11-23 – 2020-11-24 (×2): 5 mg via ORAL
  Filled 2020-11-23 (×2): qty 0.5

## 2020-11-23 NOTE — Progress Notes (Signed)
This chaplain responded to the unit page for Pt. Advance Directive education.  The Pt. daughter-Dana is at the Pt. Bedside. The Pt. is awake and willing to participate in AD education. The Pt. AD is filled out and waiting for a notary in the Pt. chart.  The chaplain will facilitate a notary visit on Thursday.   Chaplain Sallyanne Kuster [phone]

## 2020-11-23 NOTE — Progress Notes (Signed)
Received request from [facility] for family interest in [facility]. Chart reviewed and [facility] eligibility is pending at this time. Spoke with patient's daughter Hinton Dyer to initiate education related to hospice philosophy and care in the [facility]. Unfortunately [employer] [facility] does not have a bed to offer today. TOC is aware hospital liaison will follow up tomorrow or sooner if room becomes available.   Please do not hesitate to call with any hospice related questions or concerns.   Thank you for the opportunity to participate in this patient's care.  Jhonnie Garner, Therapist, sports, [facility] Liaison  [phone]

## 2020-11-23 NOTE — Progress Notes (Signed)
PROGRESS NOTE    Sheena Morrow  TMH:[id number] DOB: August 28, 1933 DOA: 11/17/2020 PCP: Claretta Fraise, MD   Brief Narrative: 87 year old with past medical history significant for CAD, CHF preserved ejection fraction, chronic A. fib, remote CVA, chronic bilateral lower extremity venous insufficiency and venous stasis dermatitis, colonic AVM with chronic GI bleed and anemia who declined endoscopy and colonoscopy at outside hospital during her admission during September, decision was to stop anticoagulation.  Patient was sent to the ED by PCP due to anasarca and oliguria.  Patient presented complaining of shortness of breath, orthopnea and lower extremity edema.  Patient stopped taking Lasix since her last hospitalization. Patient admitted with CHF exacerbation.  Patient was also found to have 1 blood culture positive for strep, thought to be contamination. ID following off antibiotics.  Patient was transitioned to full comfort care on 11/21/2020.   Assessment & Plan:   Principal Problem:   CHF exacerbation ([place]) Active Problems:   Acute urinary retention   Acute-on-chronic kidney injury ([place])   Atrial fibrillation, chronic (HCC)   Hypothermia   Protein-calorie malnutrition, severe   Pressure injury of skin   Bacteremia  1-Acute on Chronic Diastolic exacerbation: Presented with dyspnea, positive JVD, bilateral crackers bilateral lower extremity edema.  Elevated BNP 687, chest x-ray pulmonary edema ECHO 60 to 65%. Diastolic dysfunction.  Patient still having SOB, plan to continue with IV lasix 60 mg twice daily. Nebulizer ordered.  Patient has transition to Comfort care, no bed available at [facility] today.  2--1 blood culture positive for strep: Infectious disease consultation, think this is contaminate.  Monitor off antibiotics.   3-AKI on CKD stage IIIb: Likely related to heart failure exacerbation. Baseline creatinine 1.5.  Presented with creatinine of 2.0.  Last creatinine 1.8 on  11/20/2020.  Now comfort care and thus no labs checked.  Acute urinary retention: Had in and out. Has foley catheter currently for comfort.   Chronic A. fib: Bradycardia: Continue to hold Cardizem Not on anticoagulation due to chronic blood loss  Hypothermia: Resolved continue Synthroid.   Elevation of TSH: Repeated TSH 18,000, free T3 free T4 0.6 Started  low dose synthroid.   Chronic Blood loss anemia: Hemoglobin is stable  Hyperkalemia: Resolved  Severe protein calorie malnutrition, hypoalbuminemia: On Ensure.   Transaminases: Follow trend Stable.   CAD, troponin elevation: Likely demand ischemia in the setting of heart failure.  Chronic bilateral lower extremity venous insufficiency Doppler negative for DVT  History of CVA chronic pain syndrome On methadone and gabapentin.   Pressure ulcer: Wound care consulted.   Pressure Injury 11/18/20 Sacrum Stage 1 -  Intact skin with non-blanchable redness of a localized area usually over a bony prominence. (Active)  11/18/20 1534  Location: Sacrum  Location Orientation:   Staging: Stage 1 -  Intact skin with non-blanchable redness of a localized area usually over a bony prominence.  Wound Description (Comments):   Present on Admission: Yes     Pressure Injury 11/18/20 Heel Right Stage 2 -  Partial thickness loss of dermis presenting as a shallow open injury with a red, pink wound bed without slough. scabbed (Active)  11/18/20 1752  Location: Heel  Location Orientation: Right  Staging: Stage 2 -  Partial thickness loss of dermis presenting as a shallow open injury with a red, pink wound bed without slough.  Wound Description (Comments): scabbed  Present on Admission: Yes     Nutrition Problem: Severe Malnutrition Etiology: chronic illness (CHF, CKD stage IIIb)  Signs/Symptoms: severe fat depletion, severe muscle depletion    Interventions: Ensure Enlive (each supplement provides 350kcal and 20 grams of  protein), Magic cup, Liberalize Diet  Estimated body mass index is 25.28 kg/m as calculated from the following:   Height as of 09/29/20: 5\' 1"  (1.549 m).   Weight as of this encounter: 60.7 kg.   DVT prophylaxis: SCD Code Status: DNR Family Communication: Daughter over phone 10/23. Disposition Plan:  Status is: Inpatient  Remains inpatient appropriate because: Chronic diastolic heart failure exacerbation, bacteremia  Consultants:  ID  Procedures:  ECHO  Antimicrobials:    Subjective: Patient seen and examined.  She is fully alert and oriented.  Still complains of some shortness of breath.  Visually looks comfortable though.  Objective: Vitals:   11/21/20 0738 11/21/20 2018 11/22/20 0725 11/22/20 2343  BP: (!) 114/44 (!) 126/44  (!) 100/40  Pulse: 61 64 64 61  Resp: 17 20 20    Temp:  98.3 F (36.8 C) 98.4 F (36.9 C) 98.6 F (37 C)  TempSrc:  Axillary Oral Oral  SpO2: 90% 93% 92% 93%  Weight:        Intake/Output Summary (Last 24 hours) at 11/23/2020 1050 Last data filed at 11/23/2020 0160 Gross per 24 hour  Intake 637 ml  Output 1575 ml  Net -938 ml    Filed Weights   11/19/20 0415  Weight: 60.7 kg    Examination:  General exam: Appears calm and comfortable  Respiratory system: Clear to auscultation. Respiratory effort normal. Cardiovascular system: S1 & S2 heard, RRR. No JVD, murmurs, rubs, gallops or clicks. No pedal edema. Gastrointestinal system: Abdomen is nondistended, soft and nontender. No organomegaly or masses felt. Normal bowel sounds heard. Central nervous system: Alert and oriented. No focal neurological deficits.    Data Reviewed: I have personally reviewed following labs and imaging studies  CBC: Recent Labs  Lab 11/17/20 1744 11/18/20 0258 11/18/20 0911 11/19/20 0100 11/20/20 1100  WBC 7.4  --  7.7 9.8 11.5*  NEUTROABS 4.9  --   --   --   --   HGB 7.2* 8.7* 9.5* 10.0* 11.0*  HCT 25.3* 29.5* 31.9* 32.1* 36.7  MCV 89.1  --   86.9 84.9 87.2  PLT 328  --  299 335 109    Basic Metabolic Panel: Recent Labs  Lab 11/17/20 1744 11/18/20 0258 11/19/20 0100 11/20/20 1100 11/20/20 2059  NA 133* 133* 135 135 133*  K 5.3* 4.5 3.7 3.5 3.6  CL 95* 97* 97* 95* 93*  CO2 25 26 28 31 29   GLUCOSE 77 77 107* 104* 146*  BUN 39* 39* 43* 43* 44*  CREATININE 2.03* 1.91* 2.20* 1.92* 1.81*  CALCIUM 8.7* 8.6* 8.1* 8.0* 7.8*  MG  --   --   --   --  1.8    GFR: Estimated Creatinine Clearance: 19 mL/min (A) (by C-G formula based on SCr of 1.81 mg/dL (H)). Liver Function Tests: Recent Labs  Lab 11/17/20 1744 11/19/20 0100  AST 187* 144*  ALT 81* 82*  ALKPHOS 95 91  BILITOT 0.7 0.7  PROT 7.6 6.6  ALBUMIN 1.8* <1.5*    No results for input(s): LIPASE, AMYLASE in the last 168 hours. No results for input(s): AMMONIA in the last 168 hours. Coagulation Profile: No results for input(s): INR, PROTIME in the last 168 hours. Cardiac Enzymes: No results for input(s): CKTOTAL, CKMB, CKMBINDEX, TROPONINI in the last 168 hours. BNP (last 3 results) No results for input(s): PROBNP in  the last 8760 hours. HbA1C: No results for input(s): HGBA1C in the last 72 hours. CBG: No results for input(s): GLUCAP in the last 168 hours. Lipid Profile: No results for input(s): CHOL, HDL, LDLCALC, TRIG, CHOLHDL, LDLDIRECT in the last 72 hours. Thyroid Function Tests: No results for input(s): TSH, T4TOTAL, FREET4, T3FREE, THYROIDAB in the last 72 hours.  Anemia Panel: No results for input(s): VITAMINB12, FOLATE, FERRITIN, TIBC, IRON, RETICCTPCT in the last 72 hours. Sepsis Labs: Recent Labs  Lab 11/18/20 0258  PROCALCITON 0.13     Recent Results (from the past 240 hour(s))  Resp Panel by RT-PCR (Flu A&B, Covid) Nasopharyngeal Swab     Status: None   Collection Time: 11/17/20  5:44 PM   Specimen: Nasopharyngeal Swab; Nasopharyngeal(NP) swabs in vial transport medium  Result Value Ref Range Status   SARS Coronavirus 2 by RT PCR  NEGATIVE NEGATIVE Final    Comment: (NOTE) SARS-CoV-2 target nucleic acids are NOT DETECTED.  The SARS-CoV-2 RNA is generally detectable in upper respiratory specimens during the acute phase of infection. The lowest concentration of SARS-CoV-2 viral copies this assay can detect is 138 copies/mL. A negative result does not preclude SARS-Cov-2 infection and should not be used as the sole basis for treatment or other patient management decisions. A negative result may occur with  improper specimen collection/handling, submission of specimen other than nasopharyngeal swab, presence of viral mutation(s) within the areas targeted by this assay, and inadequate number of viral copies(<138 copies/mL). A negative result must be combined with clinical observations, patient history, and epidemiological information. The expected result is Negative.  Fact Sheet for Patients:  EntrepreneurPulse.com.au  Fact Sheet for Healthcare Providers:  IncredibleEmployment.be  This test is no t yet approved or cleared by the [place] FDA and  has been authorized for detection and/or diagnosis of SARS-CoV-2 by FDA under an Emergency Use Authorization (EUA). This EUA will remain  in effect (meaning this test can be used) for the duration of the COVID-19 declaration under Section 564(b)(1) of the Act, 21 U.S.C.section 360bbb-3(b)(1), unless the authorization is terminated  or revoked sooner.       Influenza A by PCR NEGATIVE NEGATIVE Final   Influenza B by PCR NEGATIVE NEGATIVE Final    Comment: (NOTE) The Xpert Xpress SARS-CoV-2/FLU/RSV plus assay is intended as an aid in the diagnosis of influenza from Nasopharyngeal swab specimens and should not be used as a sole basis for treatment. Nasal washings and aspirates are unacceptable for Xpert Xpress SARS-CoV-2/FLU/RSV testing.  Fact Sheet for Patients: EntrepreneurPulse.com.au  Fact Sheet for  Healthcare Providers: IncredibleEmployment.be  This test is not yet approved or cleared by the [place] FDA and has been authorized for detection and/or diagnosis of SARS-CoV-2 by FDA under an Emergency Use Authorization (EUA). This EUA will remain in effect (meaning this test can be used) for the duration of the COVID-19 declaration under Section 564(b)(1) of the Act, 21 U.S.C. section 360bbb-3(b)(1), unless the authorization is terminated or revoked.  Performed at [facility], [place] [address], [place], [place] [address]   Culture, blood (routine x 2)     Status: None   Collection Time: 11/18/20  3:00 AM   Specimen: BLOOD LEFT HAND  Result Value Ref Range Status   Specimen Description BLOOD LEFT HAND  Final   Special Requests   Final    BOTTLES DRAWN AEROBIC AND ANAEROBIC Blood Culture results may not be optimal due to an inadequate volume of blood received in culture  bottles   Culture   Final    NO GROWTH 5 DAYS Performed at [facility], [place] [address] [place], [place] [address]    Report Status 11/23/2020 FINAL  Final  Culture, blood (routine x 2)     Status: Abnormal   Collection Time: 11/18/20  3:06 AM   Specimen: BLOOD RIGHT HAND  Result Value Ref Range Status   Specimen Description BLOOD RIGHT HAND  Final   Special Requests   Final    BOTTLES DRAWN AEROBIC AND ANAEROBIC Blood Culture results may not be optimal due to an inadequate volume of blood received in culture bottles   Culture  Setup Time   Final    GRAM POSITIVE COCCI AEROBIC BOTTLE ONLY CRITICAL RESULT CALLED TO, READ BACK BY AND VERIFIED WITH: PHARMD JAMES LEDFORD 11/19/20@4 :11 BY TW    Culture (A)  Final    VIRIDANS STREPTOCOCCUS THE SIGNIFICANCE OF ISOLATING THIS ORGANISM FROM A SINGLE SET OF BLOOD CULTURES WHEN MULTIPLE SETS ARE DRAWN IS UNCERTAIN. PLEASE NOTIFY THE MICROBIOLOGY DEPARTMENT WITHIN ONE WEEK IF SPECIATION AND SENSITIVITIES ARE REQUIRED. Performed at [facility], [place] [address], [place], [place] [address]    Report Status 11/20/2020 FINAL  Final  Blood Culture ID Panel (Reflexed)     Status: Abnormal   Collection Time: 11/18/20  3:06 AM  Result Value Ref Range Status   Enterococcus faecalis NOT DETECTED NOT DETECTED Final   Enterococcus Faecium NOT DETECTED NOT DETECTED Final   Listeria monocytogenes NOT DETECTED NOT DETECTED Final   Staphylococcus species NOT DETECTED NOT DETECTED Final   Staphylococcus aureus (BCID) NOT DETECTED NOT DETECTED Final   Staphylococcus epidermidis NOT DETECTED NOT DETECTED Final   Staphylococcus lugdunensis NOT DETECTED NOT DETECTED Final   Streptococcus species DETECTED (A) NOT DETECTED Final    Comment: Not Enterococcus species, Streptococcus agalactiae, Streptococcus pyogenes, or Streptococcus pneumoniae. CRITICAL RESULT CALLED TO, READ BACK BY AND VERIFIED WITH: PHARMD JAMES LEDFORD 11/19/20@4 :11 BY TW    Streptococcus agalactiae NOT DETECTED NOT DETECTED Final   Streptococcus pneumoniae NOT DETECTED NOT DETECTED Final   Streptococcus pyogenes NOT DETECTED NOT DETECTED Final   A.calcoaceticus-baumannii NOT DETECTED NOT DETECTED Final   Bacteroides fragilis NOT DETECTED NOT DETECTED Final   Enterobacterales NOT DETECTED NOT DETECTED Final   Enterobacter cloacae complex NOT DETECTED NOT DETECTED Final   Escherichia coli NOT DETECTED NOT DETECTED Final   Klebsiella aerogenes NOT DETECTED NOT DETECTED Final   Klebsiella oxytoca NOT DETECTED NOT DETECTED Final   Klebsiella pneumoniae NOT DETECTED NOT DETECTED Final   Proteus species NOT DETECTED NOT DETECTED Final   Salmonella species NOT DETECTED NOT DETECTED Final   Serratia marcescens NOT DETECTED NOT DETECTED Final   Haemophilus influenzae NOT DETECTED NOT DETECTED Final   Neisseria meningitidis NOT DETECTED NOT DETECTED Final   Pseudomonas aeruginosa NOT DETECTED NOT DETECTED Final   Stenotrophomonas maltophilia NOT DETECTED NOT DETECTED  Final   Candida albicans NOT DETECTED NOT DETECTED Final   Candida auris NOT DETECTED NOT DETECTED Final   Candida glabrata NOT DETECTED NOT DETECTED Final   Candida krusei NOT DETECTED NOT DETECTED Final   Candida parapsilosis NOT DETECTED NOT DETECTED Final   Candida tropicalis NOT DETECTED NOT DETECTED Final   Cryptococcus neoformans/gattii NOT DETECTED NOT DETECTED Final    Comment: Performed at [facility], [address] [place], [place] [address]          Radiology Studies: No results found.  Scheduled Meds:  Chlorhexidine Gluconate Cloth  6 each Topical Q0600   feeding supplement  237 mL Oral BID BM   furosemide  60 mg Intravenous BID   gabapentin  1,600 mg Oral QHS   gabapentin  800 mg Oral BID WC   methadone  10 mg Oral TID   montelukast  10 mg Oral QHS   morphine  5 mg Oral Q6H   pantoprazole  40 mg Oral Daily   polyethylene glycol  17 g Oral Daily   senna-docusate  1 tablet Oral BID   Continuous Infusions:   LOS: 6 days  Time spent: 30 minutes.  Darliss Cheney, MD [facility]   If 7PM-7AM, please contact night-coverage [email or web address]  11/23/2020, 10:50 AM

## 2020-11-23 NOTE — Progress Notes (Signed)
SLP Cancellation Note  Patient Details Name: Ivadell Gaul MRN: [id number] DOB: Jan 01, 1934   Cancelled treatment:       Reason Eval/Treat Not Completed: SLP screened, no needs identified, will sign off (Pt has been trasitioned to comfort care. Pt's case discussed with MD and it was agreed that swallow evaluation would not be completed at this time.)  Raniyah Curenton I. Hardin Negus, [place], [place] Office number [phone] Pager [place] 11/23/2020, 1:59 PM

## 2020-11-23 NOTE — Progress Notes (Signed)
PT Cancellation Note  Patient Details Name: [name] MRN: [id number] DOB: Nov 05, 1933   Cancelled Treatment:    Reason Eval/Treat Not Completed: Other (comment) Pt is now comfort care. Spoke with pt and she declined further therapy today and in future. States "I just want to rest." Will sign off at this time. Abran Richard, PT Acute Rehab Services Pager [phone] [facility] [phone]   Karlton Lemon 11/23/2020, 12:41 PM

## 2020-11-23 NOTE — Progress Notes (Signed)
Patient ID: Sheena Morrow, female   DOB: December 30, 1933, 87 y.o.   MRN: [id number]    Progress Note from the Palliative Medicine Team at [facility]   Patient Name: Sheena Morrow        Date: 11/23/2020 DOB: 04-18-1933  Age: 87 y.o. MRN#: [id number] Attending Physician: Sheena Cheney, MD Primary Care Physician: Sheena Fraise, MD Admit Date: 11/17/2020   Medical records reviewed, discussed with bedside RN and attending.  Focus of care is comfort, quality and dignity; allowing for a natural death.  Most recent labs with leukocytosis, hypoalbuminemia   Patient continue to decline.  Poor po intake, increasing dyspnea.  Spoke to [employer] support person for Sheena Morrow.  Both she and the patient understand the limited prognosis and are hopeful for residential hospice facility for end of life care.    Plan of [place] and dignity- allow a natural death  - diet as tolerated - symptom management   (Roxanol, Ativan)     May continue with Methadone as long as patient is able to swallow However utilize Roxanol as scheduled and  prn for symptoms related to EOL --dyspnea, cough/respiratory distress, pain  - Hopeful for residential Hospice for EOL care- family is requesting [facility] Prognosis is less than two weeks  Education offered on natural trajectory and expectations at EOL  Questions and concerns addressed   Discussed with Dr Sheena Morrow   Total time spent was 35 minutes  Greater than 50% of the time was spent in counseling and coordination of care  Sheena Lessen NP  Palliative Medicine Team Team Phone # [phone] Pager [phone]

## 2020-11-23 NOTE — TOC CM/SW Note (Signed)
HF TOC CM contacted [facility] rep, Misty with new referral to [employer].[place], Heart Failure TOC CM [phone]

## 2020-11-24 DIAGNOSIS — I5033 Acute on chronic diastolic (congestive) heart failure: Secondary | ICD-10-CM | POA: Diagnosis not present

## 2020-11-24 MED ORDER — MORPHINE SULFATE (CONCENTRATE) 10 MG/0.5ML PO SOLN
5.0000 mg | ORAL | Status: DC | PRN
  Administered 2020-11-25: 5 mg via ORAL
  Filled 2020-11-24: qty 0.5

## 2020-11-24 MED ORDER — GUAIFENESIN 100 MG/5ML PO LIQD
5.0000 mL | ORAL | Status: DC | PRN
  Administered 2020-11-24 – 2020-11-25 (×2): 5 mL via ORAL
  Filled 2020-11-24 (×2): qty 5

## 2020-11-24 MED ORDER — MORPHINE SULFATE (CONCENTRATE) 10 MG/0.5ML PO SOLN
5.0000 mg | Freq: Four times a day (QID) | ORAL | Status: DC
Start: 2020-11-24 — End: 2020-11-27
  Administered 2020-11-24 – 2020-11-27 (×3): 5 mg via ORAL
  Filled 2020-11-24 (×4): qty 0.5

## 2020-11-24 MED ORDER — MORPHINE SULFATE 10 MG/5ML PO SOLN: 5.0000 mg | Freq: Four times a day (QID) | ORAL | Status: DC

## 2020-11-24 NOTE — Progress Notes (Signed)
This chaplain is present with the Pt., notary, and two witnesses for the notarizing of the Pt. Advance Directive:  HCPOA and Living Will.  The Pt. names Sheena Morrow as her healthcare agent.  If the healthcare agent is unable or unwilling to serve in this role, the Pt. names [name] as her next choice.  The chaplain gave the original document to the Pt. along with two copies.  A copy of the Pt. AD was scanned into EMR.  This chaplain is available for F/U spiritual care as needed.  Chaplain Sallyanne Kuster [phone]

## 2020-11-24 NOTE — TOC Progression Note (Signed)
Transition of Care [facility]) - Progression Note    Patient Details  Name: Sheena Morrow MRN: [id number] Date of Birth: [birth year]  Transition of Care [name] CM/SW [place], [place] Phone Number: 11/24/2020, 12:23 PM  Clinical Narrative:    Lenna Sciara reports there are no beds available at [facility] at this time.   CSW will continue to follow throughout discharge.   Expected Discharge Plan: [place] Barriers to Discharge: Continued Medical Work up  Expected Discharge Plan and Services Expected Discharge Plan: [place] In-house Referral: Clinical Social Work Discharge Planning Services: CM Consult Post Acute Care Choice: [place] arrangements for the past 2 months: Single Family Home                                       Social Determinants of Health (SDOH) Interventions    Readmission Risk Interventions No flowsheet data found.  Marlys Stegmaier, MSW, [place] Heart Failure Social Worker

## 2020-11-24 NOTE — Progress Notes (Signed)
PROGRESS NOTE    Sheena Morrow  YFV:[id number] DOB: December 02, 1933 DOA: 11/17/2020 PCP: Claretta Fraise, MD   Brief Narrative: 87 year old with past medical history significant for CAD, CHF preserved ejection fraction, chronic A. fib, remote CVA, chronic bilateral lower extremity venous insufficiency and venous stasis dermatitis, colonic AVM with chronic GI bleed and anemia who declined endoscopy and colonoscopy at outside hospital during her admission during September, decision was to stop anticoagulation.  Patient was sent to the ED by PCP due to anasarca and oliguria.  Patient presented complaining of shortness of breath, orthopnea and lower extremity edema.  Patient stopped taking Lasix since her last hospitalization. Patient admitted with CHF exacerbation.  Patient was also found to have 1 blood culture positive for strep, thought to be contamination. ID following off antibiotics.  Patient was transitioned to full comfort care on 11/21/2020.   Assessment & Plan:   Principal Problem:   CHF exacerbation ([place]) Active Problems:   Acute urinary retention   Acute-on-chronic kidney injury ([place])   Atrial fibrillation, chronic (HCC)   Hypothermia   Protein-calorie malnutrition, severe   Pressure injury of skin   Bacteremia  1-Acute on Chronic Diastolic exacerbation: Presented with dyspnea, positive JVD, bilateral crackers bilateral lower extremity edema.  Elevated BNP 687, chest x-ray pulmonary edema ECHO 60 to 65%. Diastolic dysfunction.  Patient still having SOB, plan to continue with IV lasix 60 mg twice daily. Nebulizer ordered.  Patient has transition to Comfort care, no bed available at [facility] today.  2--1 blood culture positive for strep: Infectious disease consultation, think this is contaminate.  Monitor off antibiotics.   3-AKI on CKD stage IIIb: Likely related to heart failure exacerbation. Baseline creatinine 1.5.  Presented with creatinine of 2.0.  Last creatinine 1.8 on  11/20/2020.  Now comfort care and thus no labs checked.  Acute urinary retention: Had in and out. Has foley catheter currently for comfort.   Chronic A. fib: Bradycardia: Continue to hold Cardizem Not on anticoagulation due to chronic blood loss  Hypothermia: Resolved continue Synthroid.   Elevation of TSH: Repeated TSH 18,000, free T3 free T4 0.6 Started  low dose synthroid.   Chronic Blood loss anemia: Hemoglobin is stable  Hyperkalemia: Resolved  Severe protein calorie malnutrition, hypoalbuminemia: On Ensure.   Transaminases: Follow trend Stable.   CAD, troponin elevation: Likely demand ischemia in the setting of heart failure.  Chronic bilateral lower extremity venous insufficiency Doppler negative for DVT  History of CVA chronic pain syndrome On methadone and gabapentin.   Pressure ulcer: Wound care consulted.   Pressure Injury 11/18/20 Sacrum Stage 1 -  Intact skin with non-blanchable redness of a localized area usually over a bony prominence. (Active)  11/18/20 1534  Location: Sacrum  Location Orientation:   Staging: Stage 1 -  Intact skin with non-blanchable redness of a localized area usually over a bony prominence.  Wound Description (Comments):   Present on Admission: Yes     Pressure Injury 11/18/20 Heel Right Stage 2 -  Partial thickness loss of dermis presenting as a shallow open injury with a red, pink wound bed without slough. scabbed (Active)  11/18/20 1752  Location: Heel  Location Orientation: Right  Staging: Stage 2 -  Partial thickness loss of dermis presenting as a shallow open injury with a red, pink wound bed without slough.  Wound Description (Comments): scabbed  Present on Admission: Yes     Nutrition Problem: Severe Malnutrition Etiology: chronic illness (CHF, CKD stage IIIb)  Signs/Symptoms: severe fat depletion, severe muscle depletion    Interventions: Ensure Enlive (each supplement provides 350kcal and 20 grams of  protein), Magic cup, Liberalize Diet  Estimated body mass index is 25.28 kg/m as calculated from the following:   Height as of 09/29/20: 5\' 1"  (1.549 m).   Weight as of this encounter: 60.7 kg.   DVT prophylaxis: SCD Code Status: DNR Family Communication: Daughter over phone 10/23. Disposition Plan:  Status is: Inpatient  Remains inpatient appropriate because: Chronic diastolic heart failure exacerbation, bacteremia  Consultants:  ID  Procedures:  ECHO  Antimicrobials:    Subjective: Seen and examined.  Comfortable.  No complaints.  Objective: Vitals:   11/21/20 2018 11/22/20 0725 11/22/20 2343 11/23/20 2041  BP: (!) 126/44  (!) 100/40 (!) 108/42  Pulse: 64 64 61   Resp: 20 20    Temp: 98.3 F (36.8 C) 98.4 F (36.9 C) 98.6 F (37 C) 98.6 F (37 C)  TempSrc: Axillary Oral Oral Oral  SpO2: 93% 92% 93%   Weight:        Intake/Output Summary (Last 24 hours) at 11/24/2020 1250 Last data filed at 11/24/2020 0839 Gross per 24 hour  Intake 480 ml  Output 1550 ml  Net -1070 ml    Filed Weights   11/19/20 0415  Weight: 60.7 kg    Examination:  General exam: Appears calm and comfortable  Respiratory system: Clear to auscultation. Respiratory effort normal. Cardiovascular system: S1 & S2 heard, RRR. No JVD, murmurs, rubs, gallops or clicks. No pedal edema. Gastrointestinal system: Abdomen is nondistended, soft and nontender. No organomegaly or masses felt. Normal bowel sounds heard. Central nervous system: Alert and oriented. No focal neurological deficits.  Data Reviewed: I have personally reviewed following labs and imaging studies  CBC: Recent Labs  Lab 11/17/20 1744 11/18/20 0258 11/18/20 0911 11/19/20 0100 11/20/20 1100  WBC 7.4  --  7.7 9.8 11.5*  NEUTROABS 4.9  --   --   --   --   HGB 7.2* 8.7* 9.5* 10.0* 11.0*  HCT 25.3* 29.5* 31.9* 32.1* 36.7  MCV 89.1  --  86.9 84.9 87.2  PLT 328  --  299 335 540    Basic Metabolic Panel: Recent Labs   Lab 11/17/20 1744 11/18/20 0258 11/19/20 0100 11/20/20 1100 11/20/20 2059  NA 133* 133* 135 135 133*  K 5.3* 4.5 3.7 3.5 3.6  CL 95* 97* 97* 95* 93*  CO2 25 26 28 31 29   GLUCOSE 77 77 107* 104* 146*  BUN 39* 39* 43* 43* 44*  CREATININE 2.03* 1.91* 2.20* 1.92* 1.81*  CALCIUM 8.7* 8.6* 8.1* 8.0* 7.8*  MG  --   --   --   --  1.8    GFR: Estimated Creatinine Clearance: 19 mL/min (A) (by C-G formula based on SCr of 1.81 mg/dL (H)). Liver Function Tests: Recent Labs  Lab 11/17/20 1744 11/19/20 0100  AST 187* 144*  ALT 81* 82*  ALKPHOS 95 91  BILITOT 0.7 0.7  PROT 7.6 6.6  ALBUMIN 1.8* <1.5*    No results for input(s): LIPASE, AMYLASE in the last 168 hours. No results for input(s): AMMONIA in the last 168 hours. Coagulation Profile: No results for input(s): INR, PROTIME in the last 168 hours. Cardiac Enzymes: No results for input(s): CKTOTAL, CKMB, CKMBINDEX, TROPONINI in the last 168 hours. BNP (last 3 results) No results for input(s): PROBNP in the last 8760 hours. HbA1C: No results for input(s): HGBA1C in the last 72 hours.  CBG: No results for input(s): GLUCAP in the last 168 hours. Lipid Profile: No results for input(s): CHOL, HDL, LDLCALC, TRIG, CHOLHDL, LDLDIRECT in the last 72 hours. Thyroid Function Tests: No results for input(s): TSH, T4TOTAL, FREET4, T3FREE, THYROIDAB in the last 72 hours.  Anemia Panel: No results for input(s): VITAMINB12, FOLATE, FERRITIN, TIBC, IRON, RETICCTPCT in the last 72 hours. Sepsis Labs: Recent Labs  Lab 11/18/20 0258  PROCALCITON 0.13     Recent Results (from the past 240 hour(s))  Resp Panel by RT-PCR (Flu A&B, Covid) Nasopharyngeal Swab     Status: None   Collection Time: 11/17/20  5:44 PM   Specimen: Nasopharyngeal Swab; Nasopharyngeal(NP) swabs in vial transport medium  Result Value Ref Range Status   SARS Coronavirus 2 by RT PCR NEGATIVE NEGATIVE Final    Comment: (NOTE) SARS-CoV-2 target nucleic acids are NOT  DETECTED.  The SARS-CoV-2 RNA is generally detectable in upper respiratory specimens during the acute phase of infection. The lowest concentration of SARS-CoV-2 viral copies this assay can detect is 138 copies/mL. A negative result does not preclude SARS-Cov-2 infection and should not be used as the sole basis for treatment or other patient management decisions. A negative result may occur with  improper specimen collection/handling, submission of specimen other than nasopharyngeal swab, presence of viral mutation(s) within the areas targeted by this assay, and inadequate number of viral copies(<138 copies/mL). A negative result must be combined with clinical observations, patient history, and epidemiological information. The expected result is Negative.  Fact Sheet for Patients:  EntrepreneurPulse.com.au  Fact Sheet for Healthcare Providers:  IncredibleEmployment.be  This test is no t yet approved or cleared by the [place] FDA and  has been authorized for detection and/or diagnosis of SARS-CoV-2 by FDA under an Emergency Use Authorization (EUA). This EUA will remain  in effect (meaning this test can be used) for the duration of the COVID-19 declaration under Section 564(b)(1) of the Act, 21 U.S.C.section 360bbb-3(b)(1), unless the authorization is terminated  or revoked sooner.       Influenza A by PCR NEGATIVE NEGATIVE Final   Influenza B by PCR NEGATIVE NEGATIVE Final    Comment: (NOTE) The Xpert Xpress SARS-CoV-2/FLU/RSV plus assay is intended as an aid in the diagnosis of influenza from Nasopharyngeal swab specimens and should not be used as a sole basis for treatment. Nasal washings and aspirates are unacceptable for Xpert Xpress SARS-CoV-2/FLU/RSV testing.  Fact Sheet for Patients: EntrepreneurPulse.com.au  Fact Sheet for Healthcare Providers: IncredibleEmployment.be  This test is not yet  approved or cleared by the [place] FDA and has been authorized for detection and/or diagnosis of SARS-CoV-2 by FDA under an Emergency Use Authorization (EUA). This EUA will remain in effect (meaning this test can be used) for the duration of the COVID-19 declaration under Section 564(b)(1) of the Act, 21 U.S.C. section 360bbb-3(b)(1), unless the authorization is terminated or revoked.  Performed at [facility], [place] [address], [place], [place] [address]   Culture, blood (routine x 2)     Status: None   Collection Time: 11/18/20  3:00 AM   Specimen: BLOOD LEFT HAND  Result Value Ref Range Status   Specimen Description BLOOD LEFT HAND  Final   Special Requests   Final    BOTTLES DRAWN AEROBIC AND ANAEROBIC Blood Culture results may not be optimal due to an inadequate volume of blood received in culture bottles   Culture   Final    NO GROWTH 5 DAYS Performed  at [facility], [place] [address] [place], [place] [address]    Report Status 11/23/2020 FINAL  Final  Culture, blood (routine x 2)     Status: Abnormal   Collection Time: 11/18/20  3:06 AM   Specimen: BLOOD RIGHT HAND  Result Value Ref Range Status   Specimen Description BLOOD RIGHT HAND  Final   Special Requests   Final    BOTTLES DRAWN AEROBIC AND ANAEROBIC Blood Culture results may not be optimal due to an inadequate volume of blood received in culture bottles   Culture  Setup Time   Final    GRAM POSITIVE COCCI AEROBIC BOTTLE ONLY CRITICAL RESULT CALLED TO, READ BACK BY AND VERIFIED WITH: PHARMD JAMES LEDFORD 11/19/20@4 :11 BY TW    Culture (A)  Final    VIRIDANS STREPTOCOCCUS THE SIGNIFICANCE OF ISOLATING THIS ORGANISM FROM A SINGLE SET OF BLOOD CULTURES WHEN MULTIPLE SETS ARE DRAWN IS UNCERTAIN. PLEASE NOTIFY THE MICROBIOLOGY DEPARTMENT WITHIN ONE WEEK IF SPECIATION AND SENSITIVITIES ARE REQUIRED. Performed at [facility], [place] [address] [place], [place] [address]    Report Status 11/20/2020  FINAL  Final  Blood Culture ID Panel (Reflexed)     Status: Abnormal   Collection Time: 11/18/20  3:06 AM  Result Value Ref Range Status   Enterococcus faecalis NOT DETECTED NOT DETECTED Final   Enterococcus Faecium NOT DETECTED NOT DETECTED Final   Listeria monocytogenes NOT DETECTED NOT DETECTED Final   Staphylococcus species NOT DETECTED NOT DETECTED Final   Staphylococcus aureus (BCID) NOT DETECTED NOT DETECTED Final   Staphylococcus epidermidis NOT DETECTED NOT DETECTED Final   Staphylococcus lugdunensis NOT DETECTED NOT DETECTED Final   Streptococcus species DETECTED (A) NOT DETECTED Final    Comment: Not Enterococcus species, Streptococcus agalactiae, Streptococcus pyogenes, or Streptococcus pneumoniae. CRITICAL RESULT CALLED TO, READ BACK BY AND VERIFIED WITH: PHARMD JAMES LEDFORD 11/19/20@4 :11 BY TW    Streptococcus agalactiae NOT DETECTED NOT DETECTED Final   Streptococcus pneumoniae NOT DETECTED NOT DETECTED Final   Streptococcus pyogenes NOT DETECTED NOT DETECTED Final   A.calcoaceticus-baumannii NOT DETECTED NOT DETECTED Final   Bacteroides fragilis NOT DETECTED NOT DETECTED Final   Enterobacterales NOT DETECTED NOT DETECTED Final   Enterobacter cloacae complex NOT DETECTED NOT DETECTED Final   Escherichia coli NOT DETECTED NOT DETECTED Final   Klebsiella aerogenes NOT DETECTED NOT DETECTED Final   Klebsiella oxytoca NOT DETECTED NOT DETECTED Final   Klebsiella pneumoniae NOT DETECTED NOT DETECTED Final   Proteus species NOT DETECTED NOT DETECTED Final   Salmonella species NOT DETECTED NOT DETECTED Final   Serratia marcescens NOT DETECTED NOT DETECTED Final   Haemophilus influenzae NOT DETECTED NOT DETECTED Final   Neisseria meningitidis NOT DETECTED NOT DETECTED Final   Pseudomonas aeruginosa NOT DETECTED NOT DETECTED Final   Stenotrophomonas maltophilia NOT DETECTED NOT DETECTED Final   Candida albicans NOT DETECTED NOT DETECTED Final   Candida auris NOT DETECTED NOT  DETECTED Final   Candida glabrata NOT DETECTED NOT DETECTED Final   Candida krusei NOT DETECTED NOT DETECTED Final   Candida parapsilosis NOT DETECTED NOT DETECTED Final   Candida tropicalis NOT DETECTED NOT DETECTED Final   Cryptococcus neoformans/gattii NOT DETECTED NOT DETECTED Final    Comment: Performed at [facility], [address], [place], [place] [address]          Radiology Studies: No results found.      Scheduled Meds:  Chlorhexidine Gluconate Cloth  6 each Topical Q0600  feeding supplement  237 mL Oral BID BM   furosemide  60 mg Intravenous BID   gabapentin  1,600 mg Oral QHS   gabapentin  800 mg Oral BID WC   methadone  10 mg Oral TID   montelukast  10 mg Oral QHS   morphine  5 mg Oral Q6H   morphine CONCENTRATE  5 mg Oral Q6H   pantoprazole  40 mg Oral Daily   polyethylene glycol  17 g Oral Daily   senna-docusate  1 tablet Oral BID   Continuous Infusions:   LOS: 7 days   Time spent: 25 minutes.  Darliss Cheney, MD [facility]   If 7PM-7AM, please contact night-coverage [email or web address]  11/24/2020, 12:50 PM

## 2020-11-24 NOTE — Progress Notes (Signed)
Engineer, maintenance [facility]) Hospital Liaison note.   Received request from [place] for family interest in [facility]. [facility] is unable to offer a room today. Hospital Liaison will follow up tomorrow or sooner if a room becomes available. Patient has been approved.   Please do not hesitate to call with questions.   Thank you,  Clementeen Hoof, BSN, RN [place] (listed on AMION under [facility] [place] [facility] [phone]   [phone]

## 2020-11-25 DIAGNOSIS — I5033 Acute on chronic diastolic (congestive) heart failure: Secondary | ICD-10-CM | POA: Diagnosis not present

## 2020-11-25 NOTE — Progress Notes (Signed)
PROGRESS NOTE    Sheena Morrow  BJS:[id number] DOB: February 13, 1933 DOA: 11/17/2020 PCP: Claretta Fraise, MD   Brief Narrative: 87 year old with past medical history significant for CAD, CHF preserved ejection fraction, chronic A. fib, remote CVA, chronic bilateral lower extremity venous insufficiency and venous stasis dermatitis, colonic AVM with chronic GI bleed and anemia who declined endoscopy and colonoscopy at outside hospital during her admission during September, decision was to stop anticoagulation.  Patient was sent to the ED by PCP due to anasarca and oliguria.  Patient presented complaining of shortness of breath, orthopnea and lower extremity edema.  Patient stopped taking Lasix since her last hospitalization. Patient admitted with CHF exacerbation.  Patient was also found to have 1 blood culture positive for strep, thought to be contamination. ID following off antibiotics.  Patient was transitioned to full comfort care on 11/21/2020.   Assessment & Plan:   Principal Problem:   CHF exacerbation ([place]) Active Problems:   Acute urinary retention   Acute-on-chronic kidney injury ([place])   Atrial fibrillation, chronic (H[facility])   Hypothermia   Protein-calorie malnutrition, severe   Pressure injury of skin   Bacteremia  1-Acute on Chronic Diastolic exacerbation: Presented with dyspnea, positive JVD, bilateral crackers bilateral lower extremity edema.  Elevated BNP 687, chest x-ray pulmonary edema ECHO 60 to 65%. Diastolic dysfunction.  Patient still having SOB, plan to continue with IV lasix 60 mg twice daily. Nebulizer ordered.  Patient has transition to Comfort care, no bed available at [facility] today.  2--1 blood culture positive for strep: Infectious disease consultation, think this is contaminate.  Monitor off antibiotics.   3-AKI on CKD stage IIIb: Likely related to heart failure exacerbation. Baseline creatinine 1.5.  Presented with creatinine of 2.0.  Last creatinine 1.8 on  11/20/2020.  Now comfort care and thus no labs checked.  Acute urinary retention: Had in and out. Has foley catheter currently for comfort.   Chronic A. fib: Bradycardia: Continue to hold Cardizem Not on anticoagulation due to chronic blood loss  Hypothermia: Resolved continue Synthroid.   Elevation of TSH: Repeated TSH 18,000, free T3 free T4 0.6 Started  low dose synthroid.   Chronic Blood loss anemia: Hemoglobin is stable  Hyperkalemia: Resolved  Severe protein calorie malnutrition, hypoalbuminemia: On Ensure.   Transaminases: Follow trend Stable.   CAD, troponin elevation: Likely demand ischemia in the setting of heart failure.  Chronic bilateral lower extremity venous insufficiency Doppler negative for DVT  History of CVA chronic pain syndrome On methadone and gabapentin.   Pressure ulcer: Wound care consulted.   Pressure Injury 11/18/20 Sacrum Stage 1 -  Intact skin with non-blanchable redness of a localized area usually over a bony prominence. (Active)  11/18/20 1534  Location: Sacrum  Location Orientation:   Staging: Stage 1 -  Intact skin with non-blanchable redness of a localized area usually over a bony prominence.  Wound Description (Comments):   Present on Admission: Yes     Pressure Injury 11/18/20 Heel Right Stage 2 -  Partial thickness loss of dermis presenting as a shallow open injury with a red, pink wound bed without slough. scabbed (Active)  11/18/20 1752  Location: Heel  Location Orientation: Right  Staging: Stage 2 -  Partial thickness loss of dermis presenting as a shallow open injury with a red, pink wound bed without slough.  Wound Description (Comments): scabbed  Present on Admission: Yes     Nutrition Problem: Severe Malnutrition Etiology: chronic illness (CHF, CKD stage IIIb)  Signs/Symptoms: severe fat depletion, severe muscle depletion    Interventions: Ensure Enlive (each supplement provides 350kcal and 20 grams of  protein), Magic cup, Liberalize Diet  Estimated body mass index is 25.28 kg/m as calculated from the following:   Height as of 09/29/20: 5\' 1"  (1.549 m).   Weight as of this encounter: 60.7 kg.   DVT prophylaxis: SCD Code Status: DNR Family Communication:  Disposition Plan:  Status is: Inpatient  Remains inpatient appropriate because: Chronic diastolic heart failure exacerbation, bacteremia  Consultants:  ID  Procedures:  ECHO  Antimicrobials:    Subjective: Patient seen and examined.  She says that she is not comfortable but she is not coming up with any specific complaints.  Objective: Vitals:   11/22/20 2343 11/23/20 2041 11/24/20 2342 11/25/20 0836  BP: (!) 100/40 (!) 108/42 (!) 114/49 130/76  Pulse: 61  (!) 58 (!) 42  Resp:   19   Temp: 98.6 F (37 C) 98.6 F (37 C) 98 F (36.7 C) 98.5 F (36.9 C)  TempSrc: Oral Oral Oral Oral  SpO2: 93%  95% 94%  Weight:        Intake/Output Summary (Last 24 hours) at 11/25/2020 1127 Last data filed at 11/25/2020 0651 Gross per 24 hour  Intake 240 ml  Output 1625 ml  Net -1385 ml    Filed Weights   11/19/20 0415  Weight: 60.7 kg    Examination:  General exam: Appears calm and comfortable  Respiratory system: Clear to auscultation. Respiratory effort normal. Cardiovascular system: S1 & S2 heard, RRR. No JVD, murmurs, rubs, gallops or clicks. No pedal edema. Gastrointestinal system: Abdomen is nondistended, soft and nontender. No organomegaly or masses felt. Normal bowel sounds heard.   Data Reviewed: I have personally reviewed following labs and imaging studies  CBC: Recent Labs  Lab 11/19/20 0100 11/20/20 1100  WBC 9.8 11.5*  HGB 10.0* 11.0*  HCT 32.1* 36.7  MCV 84.9 87.2  PLT 335 366    Basic Metabolic Panel: Recent Labs  Lab 11/19/20 0100 11/20/20 1100 11/20/20 2059  NA 135 135 133*  K 3.7 3.5 3.6  CL 97* 95* 93*  CO2 28 31 29   GLUCOSE 107* 104* 146*  BUN 43* 43* 44*  CREATININE 2.20*  1.92* 1.81*  CALCIUM 8.1* 8.0* 7.8*  MG  --   --  1.8    GFR: Estimated Creatinine Clearance: 19 mL/min (A) (by C-G formula based on SCr of 1.81 mg/dL (H)). Liver Function Tests: Recent Labs  Lab 11/19/20 0100  AST 144*  ALT 82*  ALKPHOS 91  BILITOT 0.7  PROT 6.6  ALBUMIN <1.5*    No results for input(s): LIPASE, AMYLASE in the last 168 hours. No results for input(s): AMMONIA in the last 168 hours. Coagulation Profile: No results for input(s): INR, PROTIME in the last 168 hours. Cardiac Enzymes: No results for input(s): CKTOTAL, CKMB, CKMBINDEX, TROPONINI in the last 168 hours. BNP (last 3 results) No results for input(s): PROBNP in the last 8760 hours. HbA1C: No results for input(s): HGBA1C in the last 72 hours. CBG: No results for input(s): GLUCAP in the last 168 hours. Lipid Profile: No results for input(s): CHOL, HDL, LDLCALC, TRIG, CHOLHDL, LDLDIRECT in the last 72 hours. Thyroid Function Tests: No results for input(s): TSH, T4TOTAL, FREET4, T3FREE, THYROIDAB in the last 72 hours.  Anemia Panel: No results for input(s): VITAMINB12, FOLATE, FERRITIN, TIBC, IRON, RETICCTPCT in the last 72 hours. Sepsis Labs: No results for input(s): PROCALCITON, LATICACIDVEN in  the last 168 hours.   Recent Results (from the past 240 hour(s))  Resp Panel by RT-PCR (Flu A&B, Covid) Nasopharyngeal Swab     Status: None   Collection Time: 11/17/20  5:44 PM   Specimen: Nasopharyngeal Swab; Nasopharyngeal(NP) swabs in vial transport medium  Result Value Ref Range Status   SARS Coronavirus 2 by RT PCR NEGATIVE NEGATIVE Final    Comment: (NOTE) SARS-CoV-2 target nucleic acids are NOT DETECTED.  The SARS-CoV-2 RNA is generally detectable in upper respiratory specimens during the acute phase of infection. The lowest concentration of SARS-CoV-2 viral copies this assay can detect is 138 copies/mL. A negative result does not preclude SARS-Cov-2 infection and should not be used as the sole  basis for treatment or other patient management decisions. A negative result may occur with  improper specimen collection/handling, submission of specimen other than nasopharyngeal swab, presence of viral mutation(s) within the areas targeted by this assay, and inadequate number of viral copies(<138 copies/mL). A negative result must be combined with clinical observations, patient history, and epidemiological information. The expected result is Negative.  Fact Sheet for Patients:  EntrepreneurPulse.com.au  Fact Sheet for Healthcare Providers:  IncredibleEmployment.be  This test is no t yet approved or cleared by the [place] FDA and  has been authorized for detection and/or diagnosis of SARS-CoV-2 by FDA under an Emergency Use Authorization (EUA). This EUA will remain  in effect (meaning this test can be used) for the duration of the COVID-19 declaration under Section 564(b)(1) of the Act, 21 U.S.C.section 360bbb-3(b)(1), unless the authorization is terminated  or revoked sooner.       Influenza A by PCR NEGATIVE NEGATIVE Final   Influenza B by PCR NEGATIVE NEGATIVE Final    Comment: (NOTE) The Xpert Xpress SARS-CoV-2/FLU/RSV plus assay is intended as an aid in the diagnosis of influenza from Nasopharyngeal swab specimens and should not be used as a sole basis for treatment. Nasal washings and aspirates are unacceptable for Xpert Xpress SARS-CoV-2/FLU/RSV testing.  Fact Sheet for Patients: EntrepreneurPulse.com.au  Fact Sheet for Healthcare Providers: IncredibleEmployment.be  This test is not yet approved or cleared by the [place] FDA and has been authorized for detection and/or diagnosis of SARS-CoV-2 by FDA under an Emergency Use Authorization (EUA). This EUA will remain in effect (meaning this test can be used) for the duration of the COVID-19 declaration under Section 564(b)(1) of the Act,  21 U.S.C. section 360bbb-3(b)(1), unless the authorization is terminated or revoked.  Performed at [facility], [place] [address] [place], [place] [address]   Culture, blood (routine x 2)     Status: None   Collection Time: 11/18/20  3:00 AM   Specimen: BLOOD LEFT HAND  Result Value Ref Range Status   Specimen Description BLOOD LEFT HAND  Final   Special Requests   Final    BOTTLES DRAWN AEROBIC AND ANAEROBIC Blood Culture results may not be optimal due to an inadequate volume of blood received in culture bottles   Culture   Final    NO GROWTH 5 DAYS Performed at [facility], [place] [address], [place], [place] [address]    Report Status 11/23/2020 FINAL  Final  Culture, blood (routine x 2)     Status: Abnormal   Collection Time: 11/18/20  3:06 AM   Specimen: BLOOD RIGHT HAND  Result Value Ref Range Status   Specimen Description BLOOD RIGHT HAND  Final   Special Requests   Final  BOTTLES DRAWN AEROBIC AND ANAEROBIC Blood Culture results may not be optimal due to an inadequate volume of blood received in culture bottles   Culture  Setup Time   Final    GRAM POSITIVE COCCI AEROBIC BOTTLE ONLY CRITICAL RESULT CALLED TO, READ BACK BY AND VERIFIED WITH: PHARMD JAMES LEDFORD 11/19/20@4 :11 BY TW    Culture (A)  Final    VIRIDANS STREPTOCOCCUS THE SIGNIFICANCE OF ISOLATING THIS ORGANISM FROM A SINGLE SET OF BLOOD CULTURES WHEN MULTIPLE SETS ARE DRAWN IS UNCERTAIN. PLEASE NOTIFY THE MICROBIOLOGY DEPARTMENT WITHIN ONE WEEK IF SPECIATION AND SENSITIVITIES ARE REQUIRED. Performed at [facility], [place] [address], [place], [place] [address]    Report Status 11/20/2020 FINAL  Final  Blood Culture ID Panel (Reflexed)     Status: Abnormal   Collection Time: 11/18/20  3:06 AM  Result Value Ref Range Status   Enterococcus faecalis NOT DETECTED NOT DETECTED Final   Enterococcus Faecium NOT DETECTED NOT DETECTED Final   Listeria monocytogenes NOT DETECTED NOT DETECTED Final    Staphylococcus species NOT DETECTED NOT DETECTED Final   Staphylococcus aureus (BCID) NOT DETECTED NOT DETECTED Final   Staphylococcus epidermidis NOT DETECTED NOT DETECTED Final   Staphylococcus lugdunensis NOT DETECTED NOT DETECTED Final   Streptococcus species DETECTED (A) NOT DETECTED Final    Comment: Not Enterococcus species, Streptococcus agalactiae, Streptococcus pyogenes, or Streptococcus pneumoniae. CRITICAL RESULT CALLED TO, READ BACK BY AND VERIFIED WITH: PHARMD JAMES LEDFORD 11/19/20@4 :11 BY TW    Streptococcus agalactiae NOT DETECTED NOT DETECTED Final   Streptococcus pneumoniae NOT DETECTED NOT DETECTED Final   Streptococcus pyogenes NOT DETECTED NOT DETECTED Final   A.calcoaceticus-baumannii NOT DETECTED NOT DETECTED Final   Bacteroides fragilis NOT DETECTED NOT DETECTED Final   Enterobacterales NOT DETECTED NOT DETECTED Final   Enterobacter cloacae complex NOT DETECTED NOT DETECTED Final   Escherichia coli NOT DETECTED NOT DETECTED Final   Klebsiella aerogenes NOT DETECTED NOT DETECTED Final   Klebsiella oxytoca NOT DETECTED NOT DETECTED Final   Klebsiella pneumoniae NOT DETECTED NOT DETECTED Final   Proteus species NOT DETECTED NOT DETECTED Final   Salmonella species NOT DETECTED NOT DETECTED Final   Serratia marcescens NOT DETECTED NOT DETECTED Final   Haemophilus influenzae NOT DETECTED NOT DETECTED Final   Neisseria meningitidis NOT DETECTED NOT DETECTED Final   Pseudomonas aeruginosa NOT DETECTED NOT DETECTED Final   Stenotrophomonas maltophilia NOT DETECTED NOT DETECTED Final   Candida albicans NOT DETECTED NOT DETECTED Final   Candida auris NOT DETECTED NOT DETECTED Final   Candida glabrata NOT DETECTED NOT DETECTED Final   Candida krusei NOT DETECTED NOT DETECTED Final   Candida parapsilosis NOT DETECTED NOT DETECTED Final   Candida tropicalis NOT DETECTED NOT DETECTED Final   Cryptococcus neoformans/gattii NOT DETECTED NOT DETECTED Final    Comment:  Performed at [facility], [address], [place], [place] [address]          Radiology Studies: No results found.      Scheduled Meds:  Chlorhexidine Gluconate Cloth  6 each Topical Q0600   feeding supplement  237 mL Oral BID BM   furosemide  60 mg Intravenous BID   gabapentin  1,600 mg Oral QHS   gabapentin  800 mg Oral BID WC   methadone  10 mg Oral TID   montelukast  10 mg Oral QHS   morphine CONCENTRATE  5 mg Oral Q6H   pantoprazole  40 mg Oral Daily   polyethylene glycol  17 g Oral Daily   senna-docusate  1 tablet Oral BID   Continuous Infusions:   LOS: 8 days   Time spent: 25 minutes.  Darliss Cheney, MD [facility]   If 7PM-7AM, please contact night-coverage [email or web address]  11/25/2020, 11:27 AM

## 2020-11-25 NOTE — Progress Notes (Signed)
[facility] ([facility])  There is not a bed available for [name] today at [facility].  [facility] will update family and hospital staff once our availability changes.  Thank you, Venia Carbon RN, BSN [facility] Liaison

## 2020-11-25 NOTE — Progress Notes (Signed)
Started screaming claiming " I dont want to be in the contest." Disoriented to place. Redirected and made comfortable on bed. Still restless. Md aware. Ativan  po given.  Went to sleep after an hour.

## 2020-11-25 NOTE — TOC CM/SW Note (Signed)
[facility] TOC CM spoke to pt's dtr, Hinton Dyer to discuss bed at another [facility]. States she is not able to travel out of [place] to another hospital facility. She is requesting pt stay on waiting list for [facility]. [place], Heart Failure TOC CM [phone]
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
## 2020-11-26 DIAGNOSIS — I5033 Acute on chronic diastolic (congestive) heart failure: Secondary | ICD-10-CM | POA: Diagnosis not present

## 2020-11-26 MED ORDER — WHITE PETROLATUM EX OINT
TOPICAL_OINTMENT | CUTANEOUS | Status: AC
  Filled 2020-11-26: qty 28.35

## 2020-11-26 MED ORDER — IPRATROPIUM-ALBUTEROL 0.5-2.5 (3) MG/3ML IN SOLN
3.0000 mL | Freq: Four times a day (QID) | RESPIRATORY_TRACT | Status: DC
  Administered 2020-11-26: 3 mL via RESPIRATORY_TRACT
  Filled 2020-11-26 (×2): qty 3

## 2020-11-26 NOTE — Progress Notes (Signed)
PROGRESS NOTE    Larah Kuntzman  GYJ:[id number] DOB: [birth year] DOA: 11/17/2020 PCP: Claretta Fraise, MD   Brief Narrative: 87 year old with past medical history significant for CAD, CHF preserved ejection fraction, chronic A. fib, remote CVA, chronic bilateral lower extremity venous insufficiency and venous stasis dermatitis, colonic AVM with chronic GI bleed and anemia who declined endoscopy and colonoscopy at outside hospital during her admission during September, decision was to stop anticoagulation.  Patient was sent to the ED by PCP due to anasarca and oliguria.  Patient presented complaining of shortness of breath, orthopnea and lower extremity edema.  Patient stopped taking Lasix since her last hospitalization. Patient admitted with CHF exacerbation.  Patient was also found to have 1 blood culture positive for strep, thought to be contamination. ID following off antibiotics.  Patient was transitioned to full comfort care on 11/21/2020.   Assessment & Plan:   Principal Problem:   CHF exacerbation ([place]) Active Problems:   Acute urinary retention   Acute-on-chronic kidney injury ([place])   Atrial fibrillation, chronic (HCC)   Hypothermia   Protein-calorie malnutrition, severe   Pressure injury of skin   Bacteremia  1-Acute on Chronic Diastolic exacerbation: Presented with dyspnea, positive JVD, bilateral crackers bilateral lower extremity edema.  Elevated BNP 687, chest x-ray pulmonary edema ECHO 60 to 65%. Diastolic dysfunction.  Patient still having SOB, plan to continue with IV lasix 60 mg twice daily. Nebulizer ordered as needed.  She complains of shortness of breath.  Wheezy on exam.  We will schedule nebulizer every 6 hours and continue as needed as backup as well. Patient was transitioned to to Comfort care, no bed available at [facility] today.  2--1 blood culture positive for strep: Infectious disease consultation, think this is contaminate.  Monitor off antibiotics.    3-AKI on CKD stage IIIb: Likely related to heart failure exacerbation. Baseline creatinine 1.5.  Presented with creatinine of 2.0.  Last creatinine 1.8 on 11/20/2020.  Now comfort care and thus no labs checked.  Acute urinary retention: Had in and out. Has foley catheter currently for comfort.   Chronic A. fib: Bradycardia: Continue to hold Cardizem Not on anticoagulation due to chronic blood loss  Hypothermia: Resolved continue Synthroid.   Elevation of TSH: Repeated TSH 18,000, free T3 free T4 0.6 Started  low dose synthroid.   Chronic Blood loss anemia: Hemoglobin is stable  Hyperkalemia: Resolved  Severe protein calorie malnutrition, hypoalbuminemia: On Ensure.   Transaminases: Follow trend Stable.   CAD, troponin elevation: Likely demand ischemia in the setting of heart failure.  Chronic bilateral lower extremity venous insufficiency Doppler negative for DVT  History of CVA chronic pain syndrome On methadone and gabapentin.   Pressure ulcer: Wound care consulted.   Pressure Injury 11/18/20 Sacrum Stage 1 -  Intact skin with non-blanchable redness of a localized area usually over a bony prominence. (Active)  11/18/20 1534  Location: Sacrum  Location Orientation:   Staging: Stage 1 -  Intact skin with non-blanchable redness of a localized area usually over a bony prominence.  Wound Description (Comments):   Present on Admission: Yes     Pressure Injury 11/18/20 Heel Right Stage 2 -  Partial thickness loss of dermis presenting as a shallow open injury with a red, pink wound bed without slough. scabbed (Active)  11/18/20 1752  Location: Heel  Location Orientation: Right  Staging: Stage 2 -  Partial thickness loss of dermis presenting as a shallow open injury with a red, pink wound bed without  slough.  Wound Description (Comments): scabbed  Present on Admission: Yes     Nutrition Problem: Severe Malnutrition Etiology: chronic illness (CHF, CKD stage  IIIb)    Signs/Symptoms: severe fat depletion, severe muscle depletion    Interventions: Ensure Enlive (each supplement provides 350kcal and 20 grams of protein), Magic cup, Liberalize Diet  Estimated body mass index is 25.28 kg/m as calculated from the following:   Height as of 09/29/20: 5\' 1"  (1.549 m).   Weight as of this encounter: 60.7 kg.   DVT prophylaxis: SCD Code Status: DNR Family Communication:  Disposition Plan:  Status is: Inpatient  Remains inpatient appropriate because: Chronic diastolic heart failure exacerbation, bacteremia  Consultants:  ID  Procedures:  ECHO  Antimicrobials:    Subjective:  Patient seen and examined.  Continues to state " I do not feel well" but able to speak in full sentences and looks comfortable.  No other complaint.  Objective: Vitals:   11/24/20 2342 11/25/20 0836 11/25/20 1953 11/26/20 0725  BP: (!) 114/49 130/76 (!) 144/40 (!) 102/48  Pulse: (!) 58 (!) 42 (!) 48 (!) 49  Resp: 19  20 20   Temp: 98 F (36.7 C) 98.5 F (36.9 C) 97.7 F (36.5 C) 98 F (36.7 C)  TempSrc: Oral Oral Oral Oral  SpO2: 95% 94% 94% (!) 88%  Weight:        Intake/Output Summary (Last 24 hours) at 11/26/2020 1147 Last data filed at 11/26/2020 0835 Gross per 24 hour  Intake 340 ml  Output 2150 ml  Net -1810 ml    Filed Weights   11/19/20 0415  Weight: 60.7 kg    Examination:  General exam: Appears calm and comfortable  Respiratory system: Some expiratory wheezes. Respiratory effort normal. Cardiovascular system: S1 & S2 heard, RRR. No JVD, murmurs, rubs, gallops or clicks. No pedal edema. Gastrointestinal system: Abdomen is nondistended, soft and nontender. No organomegaly or masses felt. Normal bowel sounds heard. Central nervous system: Alert  Data Reviewed: I have personally reviewed following labs and imaging studies  CBC: Recent Labs  Lab 11/20/20 1100  WBC 11.5*  HGB 11.0*  HCT 36.7  MCV 87.2  PLT 247    Basic  Metabolic Panel: Recent Labs  Lab 11/20/20 1100 11/20/20 2059  NA 135 133*  K 3.5 3.6  CL 95* 93*  CO2 31 29  GLUCOSE 104* 146*  BUN 43* 44*  CREATININE 1.92* 1.81*  CALCIUM 8.0* 7.8*  MG  --  1.8    GFR: Estimated Creatinine Clearance: 19 mL/min (A) (by C-G formula based on SCr of 1.81 mg/dL (H)). Liver Function Tests: No results for input(s): AST, ALT, ALKPHOS, BILITOT, PROT, ALBUMIN in the last 168 hours.  No results for input(s): LIPASE, AMYLASE in the last 168 hours. No results for input(s): AMMONIA in the last 168 hours. Coagulation Profile: No results for input(s): INR, PROTIME in the last 168 hours. Cardiac Enzymes: No results for input(s): CKTOTAL, CKMB, CKMBINDEX, TROPONINI in the last 168 hours. BNP (last 3 results) No results for input(s): PROBNP in the last 8760 hours. HbA1C: No results for input(s): HGBA1C in the last 72 hours. CBG: No results for input(s): GLUCAP in the last 168 hours. Lipid Profile: No results for input(s): CHOL, HDL, LDLCALC, TRIG, CHOLHDL, LDLDIRECT in the last 72 hours. Thyroid Function Tests: No results for input(s): TSH, T4TOTAL, FREET4, T3FREE, THYROIDAB in the last 72 hours.  Anemia Panel: No results for input(s): VITAMINB12, FOLATE, FERRITIN, TIBC, IRON, RETICCTPCT in the last  72 hours. Sepsis Labs: No results for input(s): PROCALCITON, LATICACIDVEN in the last 168 hours.   Recent Results (from the past 240 hour(s))  Resp Panel by RT-PCR (Flu A&B, Covid) Nasopharyngeal Swab     Status: None   Collection Time: 11/17/20  5:44 PM   Specimen: Nasopharyngeal Swab; Nasopharyngeal(NP) swabs in vial transport medium  Result Value Ref Range Status   SARS Coronavirus 2 by RT PCR NEGATIVE NEGATIVE Final    Comment: (NOTE) SARS-CoV-2 target nucleic acids are NOT DETECTED.  The SARS-CoV-2 RNA is generally detectable in upper respiratory specimens during the acute phase of infection. The lowest concentration of SARS-CoV-2 viral copies  this assay can detect is 138 copies/mL. A negative result does not preclude SARS-Cov-2 infection and should not be used as the sole basis for treatment or other patient management decisions. A negative result may occur with  improper specimen collection/handling, submission of specimen other than nasopharyngeal swab, presence of viral mutation(s) within the areas targeted by this assay, and inadequate number of viral copies(<138 copies/mL). A negative result must be combined with clinical observations, patient history, and epidemiological information. The expected result is Negative.  Fact Sheet for Patients:  EntrepreneurPulse.com.au  Fact Sheet for Healthcare Providers:  IncredibleEmployment.be  This test is no t yet approved or cleared by the [place] FDA and  has been authorized for detection and/or diagnosis of SARS-CoV-2 by FDA under an Emergency Use Authorization (EUA). This EUA will remain  in effect (meaning this test can be used) for the duration of the COVID-19 declaration under Section 564(b)(1) of the Act, 21 U.S.C.section 360bbb-3(b)(1), unless the authorization is terminated  or revoked sooner.       Influenza A by PCR NEGATIVE NEGATIVE Final   Influenza B by PCR NEGATIVE NEGATIVE Final    Comment: (NOTE) The Xpert Xpress SARS-CoV-2/FLU/RSV plus assay is intended as an aid in the diagnosis of influenza from Nasopharyngeal swab specimens and should not be used as a sole basis for treatment. Nasal washings and aspirates are unacceptable for Xpert Xpress SARS-CoV-2/FLU/RSV testing.  Fact Sheet for Patients: EntrepreneurPulse.com.au  Fact Sheet for Healthcare Providers: IncredibleEmployment.be  This test is not yet approved or cleared by the [place] FDA and has been authorized for detection and/or diagnosis of SARS-CoV-2 by FDA under an Emergency Use Authorization (EUA). This EUA  will remain in effect (meaning this test can be used) for the duration of the COVID-19 declaration under Section 564(b)(1) of the Act, 21 U.S.C. section 360bbb-3(b)(1), unless the authorization is terminated or revoked.  Performed at [facility], [place] [address], [place], [place] [address]   Culture, blood (routine x 2)     Status: None   Collection Time: 11/18/20  3:00 AM   Specimen: BLOOD LEFT HAND  Result Value Ref Range Status   Specimen Description BLOOD LEFT HAND  Final   Special Requests   Final    BOTTLES DRAWN AEROBIC AND ANAEROBIC Blood Culture results may not be optimal due to an inadequate volume of blood received in culture bottles   Culture   Final    NO GROWTH 5 DAYS Performed at [facility], [place] [address] [place], [place] [address]    Report Status 11/23/2020 FINAL  Final  Culture, blood (routine x 2)     Status: Abnormal   Collection Time: 11/18/20  3:06 AM   Specimen: BLOOD RIGHT HAND  Result Value Ref Range Status   Specimen Description BLOOD RIGHT HAND  Final   Special Requests   Final    BOTTLES DRAWN AEROBIC AND ANAEROBIC Blood Culture results may not be optimal due to an inadequate volume of blood received in culture bottles   Culture  Setup Time   Final    GRAM POSITIVE COCCI AEROBIC BOTTLE ONLY CRITICAL RESULT CALLED TO, READ BACK BY AND VERIFIED WITH: PHARMD JAMES LEDFORD 11/19/20@4 :11 BY TW    Culture (A)  Final    VIRIDANS STREPTOCOCCUS THE SIGNIFICANCE OF ISOLATING THIS ORGANISM FROM A SINGLE SET OF BLOOD CULTURES WHEN MULTIPLE SETS ARE DRAWN IS UNCERTAIN. PLEASE NOTIFY THE MICROBIOLOGY DEPARTMENT WITHIN ONE WEEK IF SPECIATION AND SENSITIVITIES ARE REQUIRED. Performed at [facility], [place] [address] [place], [place] [address]    Report Status 11/20/2020 FINAL  Final  Blood Culture ID Panel (Reflexed)     Status: Abnormal   Collection Time: 11/18/20  3:06 AM  Result Value Ref Range Status   Enterococcus faecalis NOT DETECTED  NOT DETECTED Final   Enterococcus Faecium NOT DETECTED NOT DETECTED Final   Listeria monocytogenes NOT DETECTED NOT DETECTED Final   Staphylococcus species NOT DETECTED NOT DETECTED Final   Staphylococcus aureus (BCID) NOT DETECTED NOT DETECTED Final   Staphylococcus epidermidis NOT DETECTED NOT DETECTED Final   Staphylococcus lugdunensis NOT DETECTED NOT DETECTED Final   Streptococcus species DETECTED (A) NOT DETECTED Final    Comment: Not Enterococcus species, Streptococcus agalactiae, Streptococcus pyogenes, or Streptococcus pneumoniae. CRITICAL RESULT CALLED TO, READ BACK BY AND VERIFIED WITH: PHARMD JAMES LEDFORD 11/19/20@4 :11 BY TW    Streptococcus agalactiae NOT DETECTED NOT DETECTED Final   Streptococcus pneumoniae NOT DETECTED NOT DETECTED Final   Streptococcus pyogenes NOT DETECTED NOT DETECTED Final   A.calcoaceticus-baumannii NOT DETECTED NOT DETECTED Final   Bacteroides fragilis NOT DETECTED NOT DETECTED Final   Enterobacterales NOT DETECTED NOT DETECTED Final   Enterobacter cloacae complex NOT DETECTED NOT DETECTED Final   Escherichia coli NOT DETECTED NOT DETECTED Final   Klebsiella aerogenes NOT DETECTED NOT DETECTED Final   Klebsiella oxytoca NOT DETECTED NOT DETECTED Final   Klebsiella pneumoniae NOT DETECTED NOT DETECTED Final   Proteus species NOT DETECTED NOT DETECTED Final   Salmonella species NOT DETECTED NOT DETECTED Final   Serratia marcescens NOT DETECTED NOT DETECTED Final   Haemophilus influenzae NOT DETECTED NOT DETECTED Final   Neisseria meningitidis NOT DETECTED NOT DETECTED Final   Pseudomonas aeruginosa NOT DETECTED NOT DETECTED Final   Stenotrophomonas maltophilia NOT DETECTED NOT DETECTED Final   Candida albicans NOT DETECTED NOT DETECTED Final   Candida auris NOT DETECTED NOT DETECTED Final   Candida glabrata NOT DETECTED NOT DETECTED Final   Candida krusei NOT DETECTED NOT DETECTED Final   Candida parapsilosis NOT DETECTED NOT DETECTED Final    Candida tropicalis NOT DETECTED NOT DETECTED Final   Cryptococcus neoformans/gattii NOT DETECTED NOT DETECTED Final    Comment: Performed at [facility], [address] [place], [place] [address]          Radiology Studies: No results found.      Scheduled Meds:  Chlorhexidine Gluconate Cloth  6 each Topical Q0600   feeding supplement  237 mL Oral BID BM   furosemide  60 mg Intravenous BID   gabapentin  1,600 mg Oral QHS   gabapentin  800 mg Oral BID WC   ipratropium-albuterol  3 mL Nebulization QID   methadone  10 mg Oral TID   montelukast  10 mg Oral QHS  morphine CONCENTRATE  5 mg Oral Q6H   pantoprazole  40 mg Oral Daily   polyethylene glycol  17 g Oral Daily   senna-docusate  1 tablet Oral BID   Continuous Infusions:   LOS: 9 days   Time spent: 24 minutes.  Darliss Cheney, MD [facility]   If 7PM-7AM, please contact night-coverage [email or web address]  11/26/2020, 11:47 AM

## 2020-11-26 NOTE — Progress Notes (Signed)
[place] [facility]) Hospital Liaison Note   Unfortunately, [facility] is not able to offer a room today. Family and [facility] Manager aware hospital liaison will follow up tomorrow or sooner if a room becomes available.    Please do not hesitate to call with any hospice related questions.    Thank you for the opportunity to participate in this patient's care.   Bobbie "Loren Racer, RN, BSN [facility] Liaison [phone]

## 2020-11-26 NOTE — TOC Progression Note (Signed)
Transition of Care [facility]) - Progression Note    Patient Details  Name: Sheena Morrow MRN: [id number] Date of Birth: 1933-09-06  Transition of Care [facility]) CM/SW [place], LCSW Phone Number: 11/26/2020, 10:08 AM  Clinical Narrative:    CSW continuing to follow for bed availability at [facility].    Expected Discharge Plan: [place] Barriers to Discharge: Continued Medical Work up  Expected Discharge Plan and Services Expected Discharge Plan: [place] In-house Referral: Clinical Social Work Discharge Planning Services: CM Consult Post Acute Care Choice: [place] arrangements for the past 2 months: Single Family Home Expected Discharge Date: 11/25/20                                     Social Determinants of Health (SDOH) Interventions    Readmission Risk Interventions No flowsheet data found.

## 2020-11-27 DIAGNOSIS — I5033 Acute on chronic diastolic (congestive) heart failure: Secondary | ICD-10-CM | POA: Diagnosis not present

## 2020-11-27 MED ORDER — METHADONE HCL 10 MG PO TABS: 10.0000 mg | ORAL_TABLET | Freq: Three times a day (TID) | ORAL | 0 refills | Status: AC

## 2020-11-27 NOTE — Discharge Summary (Signed)
Physician Discharge Summary  Sheena Morrow KGM:[id number] DOB: Jul 12, 1933 DOA: 11/17/2020  PCP: Claretta Fraise, MD  Admit date: 11/17/2020 Discharge date: 11/27/2020 30 Day Unplanned Readmission Risk Score    Flowsheet Row ED to Hosp-Admission (Current) from 11/17/2020 in [facility]  30 Day Unplanned Readmission Risk Score (%) 21.1 Filed at 11/27/2020 0801       This score is the patient's risk of an unplanned readmission within 30 days of being discharged (0 -100%). The score is based on dignosis, age, lab data, medications, orders, and past utilization.   Low:  0-14.9   Medium: 15-21.9   High: 22-29.9   Extreme: 30 and above          Admitted From: Home Disposition: [facility]  Recommendations for Outpatient Follow-up:  Follow up with PCP in 1-2 weeks Please obtain BMP/CBC in one week Please follow up with your PCP on the following pending results: Unresulted Labs (From admission, onward)    None       Discharge Condition: Guarded CODE STATUS: DNR Diet recommendation: Regular  Subjective: Seen and examined.  She feels better today.  No shortness of breath.  Looks comfortable as well.  Brief/Interim Summary: 87 year old with past medical history significant for CAD, CHF preserved ejection fraction, chronic A. fib, remote CVA, chronic bilateral lower extremity venous insufficiency and venous stasis dermatitis, colonic AVM with chronic GI bleed and anemia who declined endoscopy and colonoscopy at outside hospital during her admission during September, decision was to stop anticoagulation.  Patient was sent to the ED by PCP due to anasarca and oliguria.  Patient presented complaining of shortness of breath, orthopnea and lower extremity edema.  Patient stopped taking Lasix since her last hospitalization. Patient admitted with CHF exacerbation.  Patient was also found to have 1 blood culture positive for strep, thought to be contamination. ID followed.  patient was transitioned to full comfort care on 11/21/2020.   1-Acute on Chronic Diastolic exacerbation: Presented with dyspnea, positive JVD, bilateral crackers bilateral lower extremity edema.  Elevated BNP 687, chest x-ray pulmonary edema ECHO 60 to 65%. Diastolic dysfunction.   Patient was complaining of shortness of breath, she has been on IV Lasix 60 mg IV.  Nebulizer was scheduled every 6 hours.  Today she feels better.  She is now being discharged to [place].  2--1 blood culture positive for strep: Infectious disease consultation, think this is contaminate.  Monitor off antibiotics.    3-AKI on CKD stage IIIb: Likely related to heart failure exacerbation. Baseline creatinine 1.5.  Presented with creatinine of 2.0.  Last creatinine 1.8 on 11/20/2020.  Now comfort care and thus no labs checked.   Acute urinary retention: Had in and out. Has foley catheter currently for comfort.    Chronic A. fib: Bradycardia: Continue to hold Cardizem Not on anticoagulation due to chronic blood loss   Hypothermia: Resolved continue Synthroid.    Elevation of TSH: Repeated TSH 18,000, free T3 free T4 0.6 Started  low dose synthroid.    Chronic Blood loss anemia: Hemoglobin is stable   Hyperkalemia: Resolved   Severe protein calorie malnutrition, hypoalbuminemia: On Ensure.    Transaminases: Follow trend Stable.    CAD, troponin elevation: Likely demand ischemia in the setting of heart failure.   Chronic bilateral lower extremity venous insufficiency Doppler negative for DVT   History of CVA chronic pain syndrome On methadone and gabapentin.   Discharge Diagnoses:  Principal Problem:   CHF exacerbation ([place]) Active  Problems:   Acute urinary retention   Acute-on-chronic kidney injury [facility])   Atrial fibrillation, chronic (HCC)   Hypothermia   Protein-calorie malnutrition, severe   Pressure injury of skin   Bacteremia    Discharge Instructions   Allergies as of  11/27/2020       Reactions   Nsaids Other (See Comments)   GI Bleed   Dexilant [dexlansoprazole] Diarrhea   Pantoprazole    Raloxifene    Other reaction(s): Cramps (ALLERGY/intolerance)        Medication List     TAKE these medications    atorvastatin 20 MG tablet Commonly known as: LIPITOR TAKE 1 TABLET BY MOUTH EVERY DAY   Biotin 10000 MCG Tabs Take 10,000 mcg by mouth daily.   diltiazem 120 MG 24 hr capsule Commonly known as: CARDIZEM CD Take 1 capsule (120 mg total) by mouth daily. For blood pressure   furosemide 40 MG tablet Commonly known as: LASIX TAKE 1 TABLET BY MOUTH 2 TIMES DAILY FOR SWELLING   gabapentin 800 MG tablet Commonly known as: NEURONTIN One in the Morning, one in the afternoon and two at night What changed:  how much to take how to take this when to take this additional instructions   Linzess 290 MCG Caps capsule Generic drug: linaclotide TAKE 1 CAPSULE (290 MCG TOTAL) BY MOUTH DAILY. TO REGULATE BOWEL MOVEMENTS   methadone 10 MG tablet Commonly known as: Dolophine Take 1 tablet (10 mg total) by mouth 3 (three) times daily.   methadone 10 MG tablet Commonly known as: Dolophine Take 1 tablet (10 mg total) by mouth 3 (three) times daily. Start taking on: November 28, 2020   montelukast 10 MG tablet Commonly known as: SINGULAIR TAKE 1 TABLET BY MOUTH EVERYDAY AT BEDTIME What changed: See the new instructions.   nitroGLYCERIN 0.4 MG SL tablet Commonly known as: NITROSTAT Place 0.4 mg under the tongue every 5 (five) minutes as needed for chest pain.   pantoprazole 40 MG tablet Commonly known as: PROTONIX Take 40 mg by mouth daily.   polyethylene glycol powder 17 GM/SCOOP powder Commonly known as: GLYCOLAX/MIRALAX One capful in water 2 times daily   Restasis 0.05 % ophthalmic emulsion Generic drug: cycloSPORINE Place 1 drop into both eyes 2 (two) times daily.   Xarelto 10 MG Tabs tablet Generic drug: rivaroxaban Take 1  tablet (10 mg total) by mouth daily.        Follow-up Information     Claretta Fraise, MD Follow up in 1 week(s).   Specialty: Family Medicine Contact information: [place] [address] [phone]                Allergies  Allergen Reactions   Nsaids Other (See Comments)    GI Bleed   Dexilant [Dexlansoprazole] Diarrhea   Pantoprazole    Raloxifene     Other reaction(s): Cramps (ALLERGY/intolerance)    Consultations: Palliative care and ID   Procedures/Studies: DG Chest Port 1 View  Result Date: 11/17/2020 CLINICAL DATA:  Short of breath EXAM: PORTABLE CHEST 1 VIEW COMPARISON:  04/13/2016 FINDINGS: Single frontal view of the chest demonstrates an enlarged cardiac silhouette. There is increased vascular congestion with bilateral interstitial and ground-glass opacities most consistent with edema. No large effusion or pneumothorax. No acute bony abnormality. IMPRESSION: 1. Findings most consistent with congestive heart failure. Electronically Signed   By: Randa Ngo M.D.   On: 11/17/2020 19:14   ECHOCARDIOGRAM COMPLETE  Result Date: 11/19/2020  ECHOCARDIOGRAM REPORT   Patient Name:   [facility] Date of Exam: 11/19/2020 Medical Rec #:  [id number]     Height:       61.0 in Accession #:    [id number]    Weight:       133.8 lb Date of Birth:  1933-11-16      BSA:          1.592 m Patient Age:    43 years      BP:           122/43 mmHg Patient Gender: F             HR:           58 bpm. Exam Location:  Inpatient Procedure: 2D Echo, Color Doppler and Cardiac Doppler Indications:    Bacteremia R78.81  History:        Patient has prior history of Echocardiogram examinations, most                 recent 06/21/2016.  Sonographer:    Merrie Roof RDCS Referring Phys: [id number] BELKYS A REGALADO IMPRESSIONS  1. Left ventricular ejection fraction, by estimation, is 60 to 65%. The left ventricle has normal function. The left ventricle has no regional wall motion  abnormalities. Left ventricular diastolic parameters are consistent with Grade I diastolic dysfunction (impaired relaxation).  2. Right ventricular systolic function is normal. The right ventricular size is normal. There is normal pulmonary artery systolic pressure. The estimated right ventricular systolic pressure is 51.7 mmHg.  3. Left atrial size was severely dilated.  4. The mitral valve is normal in structure. Moderate to severe mitral valve regurgitation. No evidence of mitral stenosis.  5. Tricuspid valve regurgitation is moderate to severe.  6. The aortic valve is normal in structure. Aortic valve regurgitation is not visualized. No aortic stenosis is present.  7. The inferior vena cava is normal in size with greater than 50% respiratory variability, suggesting right atrial pressure of 3 mmHg. Conclusion(s)/Recommendation(s): No evidence of valvular vegetations on this transthoracic echocardiogram. Would recommend a transesophageal echocardiogram to exclude infective endocarditis if clinically indicated. FINDINGS  Left Ventricle: Left ventricular ejection fraction, by estimation, is 60 to 65%. The left ventricle has normal function. The left ventricle has no regional wall motion abnormalities. The left ventricular internal cavity size was normal in size. There is  no left ventricular hypertrophy. Left ventricular diastolic parameters are consistent with Grade I diastolic dysfunction (impaired relaxation). Right Ventricle: The right ventricular size is normal. No increase in right ventricular wall thickness. Right ventricular systolic function is normal. There is normal pulmonary artery systolic pressure. The tricuspid regurgitant velocity is 2.60 m/s, and  with an assumed right atrial pressure of 8 mmHg, the estimated right ventricular systolic pressure is 61.6 mmHg. Left Atrium: Left atrial size was severely dilated. Right Atrium: Right atrial size was normal in size. Pericardium: There is no evidence of  pericardial effusion. Mitral Valve: The mitral valve is normal in structure. Moderate to severe mitral valve regurgitation. No evidence of mitral valve stenosis. Tricuspid Valve: The tricuspid valve is normal in structure. Tricuspid valve regurgitation is moderate to severe. No evidence of tricuspid stenosis. Aortic Valve: The aortic valve is normal in structure. Aortic valve regurgitation is not visualized. No aortic stenosis is present. Aortic valve mean gradient measures 7.0 mmHg. Aortic valve peak gradient measures 12.4 mmHg. Pulmonic Valve: The pulmonic valve was normal in structure. Pulmonic valve regurgitation is mild to moderate. No evidence  of pulmonic stenosis. Aorta: The aortic root is normal in size and structure. Venous: The inferior vena cava is normal in size with greater than 50% respiratory variability, suggesting right atrial pressure of 3 mmHg. IAS/Shunts: No atrial level shunt detected by color flow Doppler.  LEFT VENTRICLE PLAX 2D LVIDd:         5.10 cm LVIDs:         3.30 cm LV PW:         0.90 cm LV IVS:        1.00 cm  RIGHT VENTRICLE             IVC RV Basal diam:  3.70 cm     IVC diam: 2.50 cm RV S prime:     10.90 cm/s TAPSE (M-mode): 1.5 cm LEFT ATRIUM              Index        RIGHT ATRIUM           Index LA diam:        4.50 cm  2.83 cm/m   RA Area:     23.30 cm LA Vol (A2C):   99.9 ml  62.74 ml/m  RA Volume:   66.30 ml  41.64 ml/m LA Vol (A4C):   96.7 ml  60.73 ml/m LA Biplane Vol: 107.0 ml 67.20 ml/m  AORTIC VALVE AV Vmax:           176.00 cm/s AV Vmean:          121.000 cm/s AV VTI:            0.329 m AV Peak Grad:      12.4 mmHg AV Mean Grad:      7.0 mmHg LVOT Vmax:         101.00 cm/s LVOT Vmean:        60.900 cm/s LVOT VTI:          0.188 m LVOT/AV VTI ratio: 0.57  AORTA Ao Root diam: 3.40 cm Ao Asc diam:  3.10 cm MR Peak grad:    112.8 mmHg   TRICUSPID VALVE MR Mean grad:    80.0 mmHg    TR Peak grad:   27.0 mmHg MR Vmax:         531.00 cm/s  TR Vmax:        260.00 cm/s MR  Vmean:        430.0 cm/s MR PISA:         1.01 cm     SHUNTS MR PISA Eff ROA: 7 mm        Systemic VTI: 0.19 m MR PISA Radius:  0.40 cm Candee Furbish MD Electronically signed by Candee Furbish MD Signature Date/Time: 11/19/2020/4:42:52 PM    Final    VAS [place] LOWER EXTREMITY VENOUS (DVT)  Result Date: 11/18/2020  Lower Venous DVT Study Patient Name:  [facility]  Date of Exam:   11/18/2020 Medical Rec #: [id number]      Accession #:    [id number] Date of Birth: 1933-10-13       Patient Gender: F Patient Age:   4 years Exam Location:  [facility] Procedure:      VAS [place] LOWER EXTREMITY VENOUS (DVT) Referring Phys: Wandra Feinstein RATHORE --------------------------------------------------------------------------------  Indications: Swelling.  Risk Factors: None identified. Limitations: Poor ultrasound/tissue interface and patient positioning, patient immobility, patient pain tolerance. Comparison Study: No prior studies. Performing Technologist: Oliver Hum RVT  Examination Guidelines: A complete evaluation includes  B-mode imaging, spectral Doppler, color Doppler, and power Doppler as needed of all accessible portions of each vessel. Bilateral testing is considered an integral part of a complete examination. Limited examinations for reoccurring indications may be performed as noted. The reflux portion of the exam is performed with the patient in reverse Trendelenburg.  +---------+---------------+---------+-----------+----------+-------------------+ RIGHT    CompressibilityPhasicitySpontaneityPropertiesThrombus Aging      +---------+---------------+---------+-----------+----------+-------------------+ CFV      Full           Yes      Yes                                      +---------+---------------+---------+-----------+----------+-------------------+ SFJ      Full                                                              +---------+---------------+---------+-----------+----------+-------------------+ FV Prox  Full                                                             +---------+---------------+---------+-----------+----------+-------------------+ FV Mid                  Yes      Yes                                      +---------+---------------+---------+-----------+----------+-------------------+ FV Distal               Yes      Yes                                      +---------+---------------+---------+-----------+----------+-------------------+ PFV      Full                                                             +---------+---------------+---------+-----------+----------+-------------------+ POP      Full           Yes      Yes                                      +---------+---------------+---------+-----------+----------+-------------------+ PTV      Full                                                             +---------+---------------+---------+-----------+----------+-------------------+ PERO  Not well visualized +---------+---------------+---------+-----------+----------+-------------------+   +---------+---------------+---------+-----------+----------+-------------------+ LEFT     CompressibilityPhasicitySpontaneityPropertiesThrombus Aging      +---------+---------------+---------+-----------+----------+-------------------+ CFV      Full           Yes      Yes                                      +---------+---------------+---------+-----------+----------+-------------------+ SFJ      Full                                                             +---------+---------------+---------+-----------+----------+-------------------+ FV Prox  Full                                                             +---------+---------------+---------+-----------+----------+-------------------+ FV  Mid   Full                                                             +---------+---------------+---------+-----------+----------+-------------------+ FV Distal               Yes      Yes                                      +---------+---------------+---------+-----------+----------+-------------------+ PFV      Full                                                             +---------+---------------+---------+-----------+----------+-------------------+ POP      Full           Yes      Yes                                      +---------+---------------+---------+-----------+----------+-------------------+ PTV      Full                                                             +---------+---------------+---------+-----------+----------+-------------------+ PERO                                                  Not well visualized +---------+---------------+---------+-----------+----------+-------------------+     Summary:  RIGHT: - There is no evidence of deep vein thrombosis in the lower extremity. However, portions of this examination were limited- see technologist comments above.  - No cystic structure found in the popliteal fossa.  LEFT: - There is no evidence of deep vein thrombosis in the lower extremity. However, portions of this examination were limited- see technologist comments above.  - No cystic structure found in the popliteal fossa.  *See table(s) above for measurements and observations. Electronically signed by Deitra Mayo MD on 11/18/2020 at 3:11:25 PM.    Final      Discharge Exam: Vitals:   11/26/20 1211 11/27/20 0708  BP:    Pulse: 83   Resp: 20   Temp:  97.6 F (36.4 C)  SpO2: (!) 87%    Vitals:   11/25/20 1953 11/26/20 0725 11/26/20 1211 11/27/20 0708  BP: (!) 144/40 (!) 102/48    Pulse: (!) 48 (!) 49 83   Resp: 20 20 20    Temp: 97.7 F (36.5 C) 98 F (36.7 C)  97.6 F (36.4 C)  TempSrc: Oral Oral  Oral  SpO2: 94% (!) 88%  (!) 87%   Weight:        General: Pt is alert, awake, not in acute distress Cardiovascular: RRR, S1/S2 +, no rubs, no gallops Respiratory: CTA bilaterally, no wheezing, no rhonchi Abdominal: Soft, NT, ND, bowel sounds + Extremities: no edema, no cyanosis    The results of significant diagnostics from this hospitalization (including imaging, microbiology, ancillary and laboratory) are listed below for reference.     Microbiology: Recent Results (from the past 240 hour(s))  Resp Panel by RT-PCR (Flu A&B, Covid) Nasopharyngeal Swab     Status: None   Collection Time: 11/17/20  5:44 PM   Specimen: Nasopharyngeal Swab; Nasopharyngeal(NP) swabs in vial transport medium  Result Value Ref Range Status   SARS Coronavirus 2 by RT PCR NEGATIVE NEGATIVE Final    Comment: (NOTE) SARS-CoV-2 target nucleic acids are NOT DETECTED.  The SARS-CoV-2 RNA is generally detectable in upper respiratory specimens during the acute phase of infection. The lowest concentration of SARS-CoV-2 viral copies this assay can detect is 138 copies/mL. A negative result does not preclude SARS-Cov-2 infection and should not be used as the sole basis for treatment or other patient management decisions. A negative result may occur with  improper specimen collection/handling, submission of specimen other than nasopharyngeal swab, presence of viral mutation(s) within the areas targeted by this assay, and inadequate number of viral copies(<138 copies/mL). A negative result must be combined with clinical observations, patient history, and epidemiological information. The expected result is Negative.  Fact Sheet for Patients:  EntrepreneurPulse.com.au  Fact Sheet for Healthcare Providers:  IncredibleEmployment.be  This test is no t yet approved or cleared by the [place] FDA and  has been authorized for detection and/or diagnosis of SARS-CoV-2 by FDA under an Emergency Use  Authorization (EUA). This EUA will remain  in effect (meaning this test can be used) for the duration of the COVID-19 declaration under Section 564(b)(1) of the Act, 21 U.S.C.section 360bbb-3(b)(1), unless the authorization is terminated  or revoked sooner.       Influenza A by PCR NEGATIVE NEGATIVE Final   Influenza B by PCR NEGATIVE NEGATIVE Final    Comment: (NOTE) The Xpert Xpress SARS-CoV-2/FLU/RSV plus assay is intended as an aid in the diagnosis of influenza from Nasopharyngeal swab specimens and should not be used as a sole basis for treatment. Nasal washings and aspirates are  unacceptable for Xpert Xpress SARS-CoV-2/FLU/RSV testing.  Fact Sheet for Patients: EntrepreneurPulse.com.au  Fact Sheet for Healthcare Providers: IncredibleEmployment.be  This test is not yet approved or cleared by the [place] FDA and has been authorized for detection and/or diagnosis of SARS-CoV-2 by FDA under an Emergency Use Authorization (EUA). This EUA will remain in effect (meaning this test can be used) for the duration of the COVID-19 declaration under Section 564(b)(1) of the Act, 21 U.S.C. section 360bbb-3(b)(1), unless the authorization is terminated or revoked.  Performed at [facility], [place] [address] [place], [place] [address]   Culture, blood (routine x 2)     Status: None   Collection Time: 11/18/20  3:00 AM   Specimen: BLOOD LEFT HAND  Result Value Ref Range Status   Specimen Description BLOOD LEFT HAND  Final   Special Requests   Final    BOTTLES DRAWN AEROBIC AND ANAEROBIC Blood Culture results may not be optimal due to an inadequate volume of blood received in culture bottles   Culture   Final    NO GROWTH 5 DAYS Performed at [facility], [place] [address] [place], [place] [address]    Report Status 11/23/2020 FINAL  Final  Culture, blood (routine x 2)     Status: Abnormal   Collection Time: 11/18/20  3:06 AM    Specimen: BLOOD RIGHT HAND  Result Value Ref Range Status   Specimen Description BLOOD RIGHT HAND  Final   Special Requests   Final    BOTTLES DRAWN AEROBIC AND ANAEROBIC Blood Culture results may not be optimal due to an inadequate volume of blood received in culture bottles   Culture  Setup Time   Final    GRAM POSITIVE COCCI AEROBIC BOTTLE ONLY CRITICAL RESULT CALLED TO, READ BACK BY AND VERIFIED WITH: PHARMD JAMES LEDFORD 11/19/20@4 :11 BY TW    Culture (A)  Final    VIRIDANS STREPTOCOCCUS THE SIGNIFICANCE OF ISOLATING THIS ORGANISM FROM A SINGLE SET OF BLOOD CULTURES WHEN MULTIPLE SETS ARE DRAWN IS UNCERTAIN. PLEASE NOTIFY THE MICROBIOLOGY DEPARTMENT WITHIN ONE WEEK IF SPECIATION AND SENSITIVITIES ARE REQUIRED. Performed at [facility], [place] [address], [place], [place] [address]    Report Status 11/20/2020 FINAL  Final  Blood Culture ID Panel (Reflexed)     Status: Abnormal   Collection Time: 11/18/20  3:06 AM  Result Value Ref Range Status   Enterococcus faecalis NOT DETECTED NOT DETECTED Final   Enterococcus Faecium NOT DETECTED NOT DETECTED Final   Listeria monocytogenes NOT DETECTED NOT DETECTED Final   Staphylococcus species NOT DETECTED NOT DETECTED Final   Staphylococcus aureus (BCID) NOT DETECTED NOT DETECTED Final   Staphylococcus epidermidis NOT DETECTED NOT DETECTED Final   Staphylococcus lugdunensis NOT DETECTED NOT DETECTED Final   Streptococcus species DETECTED (A) NOT DETECTED Final    Comment: Not Enterococcus species, Streptococcus agalactiae, Streptococcus pyogenes, or Streptococcus pneumoniae. CRITICAL RESULT CALLED TO, READ BACK BY AND VERIFIED WITH: PHARMD JAMES LEDFORD 11/19/20@4 :11 BY TW    Streptococcus agalactiae NOT DETECTED NOT DETECTED Final   Streptococcus pneumoniae NOT DETECTED NOT DETECTED Final   Streptococcus pyogenes NOT DETECTED NOT DETECTED Final   A.calcoaceticus-baumannii NOT DETECTED NOT DETECTED Final   Bacteroides fragilis NOT  DETECTED NOT DETECTED Final   Enterobacterales NOT DETECTED NOT DETECTED Final   Enterobacter cloacae complex NOT DETECTED NOT DETECTED Final   Escherichia coli NOT DETECTED NOT DETECTED Final   Klebsiella aerogenes NOT DETECTED NOT DETECTED Final  Klebsiella oxytoca NOT DETECTED NOT DETECTED Final   Klebsiella pneumoniae NOT DETECTED NOT DETECTED Final   Proteus species NOT DETECTED NOT DETECTED Final   Salmonella species NOT DETECTED NOT DETECTED Final   Serratia marcescens NOT DETECTED NOT DETECTED Final   Haemophilus influenzae NOT DETECTED NOT DETECTED Final   Neisseria meningitidis NOT DETECTED NOT DETECTED Final   Pseudomonas aeruginosa NOT DETECTED NOT DETECTED Final   Stenotrophomonas maltophilia NOT DETECTED NOT DETECTED Final   Candida albicans NOT DETECTED NOT DETECTED Final   Candida auris NOT DETECTED NOT DETECTED Final   Candida glabrata NOT DETECTED NOT DETECTED Final   Candida krusei NOT DETECTED NOT DETECTED Final   Candida parapsilosis NOT DETECTED NOT DETECTED Final   Candida tropicalis NOT DETECTED NOT DETECTED Final   Cryptococcus neoformans/gattii NOT DETECTED NOT DETECTED Final    Comment: Performed at [facility] Lab, [facility] [address] [place], [place] [address]     Labs: BNP (last 3 results) Recent Labs    11/17/20 1744  BNP 109.3*   Basic Metabolic Panel: Recent Labs  Lab 11/20/20 1100 11/20/20 2059  NA 135 133*  K 3.5 3.6  CL 95* 93*  CO2 31 29  GLUCOSE 104* 146*  BUN 43* 44*  CREATININE 1.92* 1.81*  CALCIUM 8.0* 7.8*  MG  --  1.8   Liver Function Tests: No results for input(s): AST, ALT, ALKPHOS, BILITOT, PROT, ALBUMIN in the last 168 hours. No results for input(s): LIPASE, AMYLASE in the last 168 hours. No results for input(s): AMMONIA in the last 168 hours. CBC: Recent Labs  Lab 11/20/20 1100  WBC 11.5*  HGB 11.0*  HCT 36.7  MCV 87.2  PLT 247   Cardiac Enzymes: No results for input(s): CKTOTAL, CKMB, CKMBINDEX, TROPONINI in  the last 168 hours. BNP: Invalid input(s): POCBNP CBG: No results for input(s): GLUCAP in the last 168 hours. D-Dimer No results for input(s): DDIMER in the last 72 hours. Hgb A1c No results for input(s): HGBA1C in the last 72 hours. Lipid Profile No results for input(s): CHOL, HDL, LDLCALC, TRIG, CHOLHDL, LDLDIRECT in the last 72 hours. Thyroid function studies No results for input(s): TSH, T4TOTAL, T3FREE, THYROIDAB in the last 72 hours.  Invalid input(s): FREET3 Anemia work up No results for input(s): VITAMINB12, FOLATE, FERRITIN, TIBC, IRON, RETICCTPCT in the last 72 hours. Urinalysis    Component Value Date/Time   COLORURINE YELLOW 11/18/2020 0258   APPEARANCEUR CLEAR 11/18/2020 0258   LABSPEC 1.009 11/18/2020 0258   PHURINE 7.0 11/18/2020 0258   GLUCOSEU NEGATIVE 11/18/2020 0258   HGBUR NEGATIVE 11/18/2020 0258   BILIRUBINUR NEGATIVE 11/18/2020 0258   BILIRUBINUR negative 01/04/2015 [place] 11/18/2020 0258   PROTEINUR NEGATIVE 11/18/2020 0258   UROBILINOGEN negative 01/04/2015 1755   NITRITE NEGATIVE 11/18/2020 0258   LEUKOCYTESUR NEGATIVE 11/18/2020 0258   Sepsis Labs Invalid input(s): PROCALCITONIN,  WBC,  LACTICIDVEN Microbiology Recent Results (from the past 240 hour(s))  Resp Panel by RT-PCR (Flu A&B, Covid) Nasopharyngeal Swab     Status: None   Collection Time: 11/17/20  5:44 PM   Specimen: Nasopharyngeal Swab; Nasopharyngeal(NP) swabs in vial transport medium  Result Value Ref Range Status   SARS Coronavirus 2 by RT PCR NEGATIVE NEGATIVE Final    Comment: (NOTE) SARS-CoV-2 target nucleic acids are NOT DETECTED.  The SARS-CoV-2 RNA is generally detectable in upper respiratory specimens during the acute phase of infection. The lowest concentration of SARS-CoV-2 viral copies this assay can detect is  138 copies/mL. A negative result does not preclude SARS-Cov-2 infection and should not be used as the sole basis for treatment or other  patient management decisions. A negative result may occur with  improper specimen collection/handling, submission of specimen other than nasopharyngeal swab, presence of viral mutation(s) within the areas targeted by this assay, and inadequate number of viral copies(<138 copies/mL). A negative result must be combined with clinical observations, patient history, and epidemiological information. The expected result is Negative.  Fact Sheet for Patients:  EntrepreneurPulse.com.au  Fact Sheet for Healthcare Providers:  IncredibleEmployment.be  This test is no t yet approved or cleared by the [place] FDA and  has been authorized for detection and/or diagnosis of SARS-CoV-2 by FDA under an Emergency Use Authorization (EUA). This EUA will remain  in effect (meaning this test can be used) for the duration of the COVID-19 declaration under Section 564(b)(1) of the Act, 21 U.S.C.section 360bbb-3(b)(1), unless the authorization is terminated  or revoked sooner.       Influenza A by PCR NEGATIVE NEGATIVE Final   Influenza B by PCR NEGATIVE NEGATIVE Final    Comment: (NOTE) The Xpert Xpress SARS-CoV-2/FLU/RSV plus assay is intended as an aid in the diagnosis of influenza from Nasopharyngeal swab specimens and should not be used as a sole basis for treatment. Nasal washings and aspirates are unacceptable for Xpert Xpress SARS-CoV-2/FLU/RSV testing.  Fact Sheet for Patients: EntrepreneurPulse.com.au  Fact Sheet for Healthcare Providers: IncredibleEmployment.be  This test is not yet approved or cleared by the [place] FDA and has been authorized for detection and/or diagnosis of SARS-CoV-2 by FDA under an Emergency Use Authorization (EUA). This EUA will remain in effect (meaning this test can be used) for the duration of the COVID-19 declaration under Section 564(b)(1) of the Act, 21 U.S.C. section  360bbb-3(b)(1), unless the authorization is terminated or revoked.  Performed at [facility], [place] [address] [place], [place] [address]   Culture, blood (routine x 2)     Status: None   Collection Time: 11/18/20  3:00 AM   Specimen: BLOOD LEFT HAND  Result Value Ref Range Status   Specimen Description BLOOD LEFT HAND  Final   Special Requests   Final    BOTTLES DRAWN AEROBIC AND ANAEROBIC Blood Culture results may not be optimal due to an inadequate volume of blood received in culture bottles   Culture   Final    NO GROWTH 5 DAYS Performed at [facility], [place] [address], [place], [place] [address]    Report Status 11/23/2020 FINAL  Final  Culture, blood (routine x 2)     Status: Abnormal   Collection Time: 11/18/20  3:06 AM   Specimen: BLOOD RIGHT HAND  Result Value Ref Range Status   Specimen Description BLOOD RIGHT HAND  Final   Special Requests   Final    BOTTLES DRAWN AEROBIC AND ANAEROBIC Blood Culture results may not be optimal due to an inadequate volume of blood received in culture bottles   Culture  Setup Time   Final    GRAM POSITIVE COCCI AEROBIC BOTTLE ONLY CRITICAL RESULT CALLED TO, READ BACK BY AND VERIFIED WITH: PHARMD JAMES LEDFORD 11/19/20@4 :11 BY TW    Culture (A)  Final    VIRIDANS STREPTOCOCCUS THE SIGNIFICANCE OF ISOLATING THIS ORGANISM FROM A SINGLE SET OF BLOOD CULTURES WHEN MULTIPLE SETS ARE DRAWN IS UNCERTAIN. PLEASE NOTIFY THE MICROBIOLOGY DEPARTMENT WITHIN ONE WEEK IF SPECIATION AND SENSITIVITIES ARE REQUIRED. Performed at [facility]  [facility], [place] [address], [place], [place] [address]    Report Status 11/20/2020 FINAL  Final  Blood Culture ID Panel (Reflexed)     Status: Abnormal   Collection Time: 11/18/20  3:06 AM  Result Value Ref Range Status   Enterococcus faecalis NOT DETECTED NOT DETECTED Final   Enterococcus Faecium NOT DETECTED NOT DETECTED Final   Listeria monocytogenes NOT DETECTED NOT DETECTED Final   Staphylococcus  species NOT DETECTED NOT DETECTED Final   Staphylococcus aureus (BCID) NOT DETECTED NOT DETECTED Final   Staphylococcus epidermidis NOT DETECTED NOT DETECTED Final   Staphylococcus lugdunensis NOT DETECTED NOT DETECTED Final   Streptococcus species DETECTED (A) NOT DETECTED Final    Comment: Not Enterococcus species, Streptococcus agalactiae, Streptococcus pyogenes, or Streptococcus pneumoniae. CRITICAL RESULT CALLED TO, READ BACK BY AND VERIFIED WITH: PHARMD JAMES LEDFORD 11/19/20@4 :11 BY TW    Streptococcus agalactiae NOT DETECTED NOT DETECTED Final   Streptococcus pneumoniae NOT DETECTED NOT DETECTED Final   Streptococcus pyogenes NOT DETECTED NOT DETECTED Final   A.calcoaceticus-baumannii NOT DETECTED NOT DETECTED Final   Bacteroides fragilis NOT DETECTED NOT DETECTED Final   Enterobacterales NOT DETECTED NOT DETECTED Final   Enterobacter cloacae complex NOT DETECTED NOT DETECTED Final   Escherichia coli NOT DETECTED NOT DETECTED Final   Klebsiella aerogenes NOT DETECTED NOT DETECTED Final   Klebsiella oxytoca NOT DETECTED NOT DETECTED Final   Klebsiella pneumoniae NOT DETECTED NOT DETECTED Final   Proteus species NOT DETECTED NOT DETECTED Final   Salmonella species NOT DETECTED NOT DETECTED Final   Serratia marcescens NOT DETECTED NOT DETECTED Final   Haemophilus influenzae NOT DETECTED NOT DETECTED Final   Neisseria meningitidis NOT DETECTED NOT DETECTED Final   Pseudomonas aeruginosa NOT DETECTED NOT DETECTED Final   Stenotrophomonas maltophilia NOT DETECTED NOT DETECTED Final   Candida albicans NOT DETECTED NOT DETECTED Final   Candida auris NOT DETECTED NOT DETECTED Final   Candida glabrata NOT DETECTED NOT DETECTED Final   Candida krusei NOT DETECTED NOT DETECTED Final   Candida parapsilosis NOT DETECTED NOT DETECTED Final   Candida tropicalis NOT DETECTED NOT DETECTED Final   Cryptococcus neoformans/gattii NOT DETECTED NOT DETECTED Final    Comment: Performed at [facility], [address] [place], [place] [address]     Time coordinating discharge: Over 30 minutes  SIGNED:   Darliss Cheney, MD  [facility] 11/27/2020, 10:38 AM  If 7PM-7AM, please contact night-coverage [email or web address]

## 2020-11-27 NOTE — Progress Notes (Signed)
This chaplain responded to the consult for EOL spiritual care presence and prayer.    The Pt. is sleeping and does not respond to the call of her name.  The space for sacred silence and quiet prayer was shared with the Pt. The chaplain is appreciative of RN-Creshenda's update.    Chaplain Sallyanne Kuster [phone]

## 2020-11-27 NOTE — Progress Notes (Signed)
RN called report [name] at [facility]. Patient ready and awaiting transport. Patient belonging sent with patient. Upper and lower dentures in place in patient oral cavity.  Foley in place.  15:04  RN confirmed with Freddy Jaksch, RN at [facility] to leave both IV's in place.

## 2020-11-27 NOTE — TOC Transition Note (Signed)
Transition of Care [facility]) - CM/SW Discharge Note   Patient Details  Name: Sheena Morrow MRN: [id number] Date of Birth: Dec 27, 1933  Transition of Care [facility]) CM/SW Contact:  Bary Castilla, LCSW Phone Number:336 [phone] 11/27/2020, 10:49 AM   Clinical Narrative:     Patient will DC to:?[facility] Anticipated DC date:?11/27/2020 Family notified:?[facility] JS:[id number]   Per MD patient ready for DC to [facility] RN, patient, patient's family, and facility notified of DC. Discharge Summary sent to facility. RN given number for report  [phone] DX packet on chart. Ambulance transport requested for patient.   CSW signing off.   Vallery Ridge, [place] [phone]    Final next level of care: [place] Barriers to Discharge: Barriers Resolved   Patient Goals and CMS Choice Patient states their goals for this hospitalization and ongoing recovery are:: get stronger before she goes home CMS Medicare.gov Compare Post Acute Care list provided to:: Patient Choice offered to / list presented to : Patient  Discharge Placement              Patient chooses bed at:  [facility]) Patient to be transferred to facility by: [facility] Name of family member notified: Hinton Dyer Patient and family notified of of transfer: 11/27/20  Discharge Plan and Services In-house Referral: Clinical Social Work Discharge Planning Services: CM Consult Post Acute Care Choice: [facility]                               Social Determinants of Health (SDOH) Interventions     Readmission Risk Interventions No flowsheet data found.

## 2020-11-27 NOTE — Progress Notes (Signed)
[facility]) Hospital Liaison Note   [facility] is able to offer a room to patient today.   Family agreeable to transfer today. Vallery Ridge, LCSW [facility] Manager aware.   RN please call report to [facility] at [phone] prior to patient leaving the unit.  Please send signed and completed DNR with patient at discharge.   Please do not hesitate to call with any hospice related questions.    Thank you for the opportunity to participate in this patient's care.   Bobbie "Loren Racer, RN, BSN [facility] Liaison [phone]

## 2020-11-28 ENCOUNTER — Telehealth: Payer: Self-pay

## 2020-11-28 NOTE — Telephone Encounter (Signed)
Called to make TCM appt, but her daughter, Hinton Dyer says she has been moved to [place] in [place] -  She is having difficulty breathing and they are just doing comfort measures She wanted to let you know she appreciates your kindness and caring for her mother all these years.

## 2020-12-06 ENCOUNTER — Telehealth: Payer: Self-pay | Admitting: Family Medicine

## 2020-12-29 ENCOUNTER — Ambulatory Visit: Payer: Medicare HMO | Admitting: Family Medicine

## 2020-12-29 DEATH — deceased

## 2021-02-03 ENCOUNTER — Other Ambulatory Visit: Payer: Self-pay | Admitting: Family Medicine
